# Patient Record
Sex: Female | Born: 1975
Health system: Southern US, Community
[De-identification: ages and names within clinical notes are randomized; demographics above are authoritative.]

## PROBLEM LIST (undated history)

## (undated) ENCOUNTER — Emergency Department (HOSPITAL_COMMUNITY): Admission: EM | Payer: Self-pay | Source: Home / Self Care

## (undated) DIAGNOSIS — B029 Zoster without complications: Secondary | ICD-10-CM

## (undated) DIAGNOSIS — I10 Essential (primary) hypertension: Secondary | ICD-10-CM

## (undated) DIAGNOSIS — K589 Irritable bowel syndrome without diarrhea: Secondary | ICD-10-CM

## (undated) DIAGNOSIS — K219 Gastro-esophageal reflux disease without esophagitis: Secondary | ICD-10-CM

## (undated) DIAGNOSIS — R0683 Snoring: Secondary | ICD-10-CM

## (undated) DIAGNOSIS — F419 Anxiety disorder, unspecified: Secondary | ICD-10-CM

## (undated) DIAGNOSIS — M179 Osteoarthritis of knee, unspecified: Secondary | ICD-10-CM

## (undated) DIAGNOSIS — K649 Unspecified hemorrhoids: Secondary | ICD-10-CM

## (undated) HISTORY — DX: Essential (primary) hypertension: I10

## (undated) HISTORY — DX: Gastro-esophageal reflux disease without esophagitis: K21.9

## (undated) HISTORY — PX: MOUTH SURGERY: SHX715

## (undated) HISTORY — PX: WISDOM TOOTH EXTRACTION: SHX21

## (undated) HISTORY — DX: Snoring: R06.83

## (undated) HISTORY — DX: Irritable bowel syndrome, unspecified: K58.9

## (undated) HISTORY — DX: Zoster without complications: B02.9

## (undated) HISTORY — DX: Osteoarthritis of knee, unspecified: M17.9

## (undated) HISTORY — DX: Unspecified hemorrhoids: K64.9

## (undated) HISTORY — DX: Anxiety disorder, unspecified: F41.9

---

## 2001-02-02 ENCOUNTER — Emergency Department (HOSPITAL_COMMUNITY): Admission: EM | Admit: 2001-02-02 | Discharge: 2001-02-02 | Payer: Self-pay | Admitting: Emergency Medicine

## 2001-02-04 ENCOUNTER — Emergency Department (HOSPITAL_COMMUNITY): Admission: EM | Admit: 2001-02-04 | Discharge: 2001-02-04 | Payer: Self-pay | Admitting: Emergency Medicine

## 2001-02-05 ENCOUNTER — Emergency Department (HOSPITAL_COMMUNITY): Admission: EM | Admit: 2001-02-05 | Discharge: 2001-02-05 | Payer: Self-pay | Admitting: Internal Medicine

## 2001-02-06 ENCOUNTER — Emergency Department (HOSPITAL_COMMUNITY): Admission: EM | Admit: 2001-02-06 | Discharge: 2001-02-06 | Payer: Self-pay | Admitting: Emergency Medicine

## 2001-02-27 ENCOUNTER — Emergency Department (HOSPITAL_COMMUNITY): Admission: EM | Admit: 2001-02-27 | Discharge: 2001-02-27 | Payer: Self-pay | Admitting: Emergency Medicine

## 2001-02-28 ENCOUNTER — Emergency Department (HOSPITAL_COMMUNITY): Admission: EM | Admit: 2001-02-28 | Discharge: 2001-02-28 | Payer: Self-pay | Admitting: Emergency Medicine

## 2001-03-01 ENCOUNTER — Ambulatory Visit (HOSPITAL_COMMUNITY): Admission: RE | Admit: 2001-03-01 | Discharge: 2001-03-01 | Payer: Self-pay | Admitting: Family Medicine

## 2001-03-01 ENCOUNTER — Encounter: Payer: Self-pay | Admitting: Family Medicine

## 2001-09-20 ENCOUNTER — Emergency Department (HOSPITAL_COMMUNITY): Admission: EM | Admit: 2001-09-20 | Discharge: 2001-09-21 | Payer: Self-pay | Admitting: *Deleted

## 2002-02-19 ENCOUNTER — Emergency Department (HOSPITAL_COMMUNITY): Admission: EM | Admit: 2002-02-19 | Discharge: 2002-02-19 | Payer: Self-pay | Admitting: Internal Medicine

## 2002-02-19 ENCOUNTER — Encounter: Payer: Self-pay | Admitting: Internal Medicine

## 2002-08-31 ENCOUNTER — Inpatient Hospital Stay (HOSPITAL_COMMUNITY): Admission: EM | Admit: 2002-08-31 | Discharge: 2002-09-03 | Payer: Self-pay | Admitting: *Deleted

## 2003-05-12 ENCOUNTER — Emergency Department (HOSPITAL_COMMUNITY): Admission: EM | Admit: 2003-05-12 | Discharge: 2003-05-12 | Payer: Self-pay | Admitting: Emergency Medicine

## 2003-09-15 ENCOUNTER — Emergency Department (HOSPITAL_COMMUNITY): Admission: EM | Admit: 2003-09-15 | Discharge: 2003-09-15 | Payer: Self-pay | Admitting: Emergency Medicine

## 2004-06-08 ENCOUNTER — Emergency Department (HOSPITAL_COMMUNITY): Admission: EM | Admit: 2004-06-08 | Discharge: 2004-06-08 | Payer: Self-pay | Admitting: Emergency Medicine

## 2007-02-02 ENCOUNTER — Emergency Department (HOSPITAL_COMMUNITY): Admission: EM | Admit: 2007-02-02 | Discharge: 2007-02-03 | Payer: Self-pay | Admitting: Emergency Medicine

## 2007-04-21 ENCOUNTER — Ambulatory Visit: Payer: Self-pay | Admitting: Cardiology

## 2007-06-21 ENCOUNTER — Emergency Department (HOSPITAL_COMMUNITY): Admission: EM | Admit: 2007-06-21 | Discharge: 2007-06-21 | Payer: Self-pay | Admitting: Emergency Medicine

## 2009-07-04 ENCOUNTER — Inpatient Hospital Stay (HOSPITAL_COMMUNITY): Admission: AD | Admit: 2009-07-04 | Discharge: 2009-07-05 | Payer: Self-pay | Admitting: Obstetrics and Gynecology

## 2009-08-15 ENCOUNTER — Ambulatory Visit (HOSPITAL_COMMUNITY): Admission: RE | Admit: 2009-08-15 | Discharge: 2009-08-15 | Payer: Self-pay | Admitting: Obstetrics & Gynecology

## 2009-08-21 ENCOUNTER — Inpatient Hospital Stay (HOSPITAL_COMMUNITY): Admission: AD | Admit: 2009-08-21 | Discharge: 2009-08-21 | Payer: Self-pay | Admitting: Obstetrics & Gynecology

## 2009-11-17 ENCOUNTER — Inpatient Hospital Stay (HOSPITAL_COMMUNITY): Admission: AD | Admit: 2009-11-17 | Discharge: 2009-11-17 | Payer: Self-pay | Admitting: Obstetrics & Gynecology

## 2010-02-07 ENCOUNTER — Inpatient Hospital Stay (HOSPITAL_COMMUNITY): Admission: AD | Admit: 2010-02-07 | Discharge: 2010-02-07 | Payer: Self-pay | Admitting: Obstetrics and Gynecology

## 2010-02-11 ENCOUNTER — Ambulatory Visit: Payer: Self-pay | Admitting: Family

## 2010-02-11 ENCOUNTER — Inpatient Hospital Stay (HOSPITAL_COMMUNITY)
Admission: AD | Admit: 2010-02-11 | Discharge: 2010-02-11 | Payer: Self-pay | Source: Home / Self Care | Admitting: Obstetrics & Gynecology

## 2010-02-20 ENCOUNTER — Inpatient Hospital Stay (HOSPITAL_COMMUNITY)
Admission: RE | Admit: 2010-02-20 | Discharge: 2010-02-22 | Payer: Self-pay | Source: Home / Self Care | Admitting: Obstetrics & Gynecology

## 2010-09-05 LAB — CBC
HCT: 34.4 % — ABNORMAL LOW (ref 36.0–46.0)
Hemoglobin: 11.3 g/dL — ABNORMAL LOW (ref 12.0–15.0)
MCH: 26.9 pg (ref 26.0–34.0)
MCHC: 32.8 g/dL (ref 30.0–36.0)
MCV: 82 fL (ref 78.0–100.0)
Platelets: 221 10*3/uL (ref 150–400)
RBC: 4.2 MIL/uL (ref 3.87–5.11)
RDW: 15.3 % (ref 11.5–15.5)
WBC: 12.4 10*3/uL — ABNORMAL HIGH (ref 4.0–10.5)

## 2010-09-06 LAB — CBC
HCT: 35 % — ABNORMAL LOW (ref 36.0–46.0)
Hemoglobin: 11.7 g/dL — ABNORMAL LOW (ref 12.0–15.0)
MCH: 26.9 pg (ref 26.0–34.0)
MCHC: 33.4 g/dL (ref 30.0–36.0)
MCV: 80.6 fL (ref 78.0–100.0)
Platelets: 216 10*3/uL (ref 150–400)
RBC: 4.34 MIL/uL (ref 3.87–5.11)
RDW: 15.2 % (ref 11.5–15.5)
WBC: 9.3 10*3/uL (ref 4.0–10.5)

## 2010-09-06 LAB — RPR: RPR Ser Ql: NONREACTIVE

## 2010-09-08 LAB — WET PREP, GENITAL
Clue Cells Wet Prep HPF POC: NONE SEEN
Trich, Wet Prep: NONE SEEN
Yeast Wet Prep HPF POC: NONE SEEN

## 2010-09-08 LAB — GC/CHLAMYDIA PROBE AMP, GENITAL
Chlamydia, DNA Probe: NEGATIVE
GC Probe Amp, Genital: NEGATIVE

## 2010-09-08 LAB — URINALYSIS, ROUTINE W REFLEX MICROSCOPIC
Bilirubin Urine: NEGATIVE
Glucose, UA: NEGATIVE mg/dL
Ketones, ur: NEGATIVE mg/dL
Leukocytes, UA: NEGATIVE
Nitrite: NEGATIVE
Protein, ur: NEGATIVE mg/dL
Specific Gravity, Urine: 1.03 (ref 1.005–1.030)
Urobilinogen, UA: 0.2 mg/dL (ref 0.0–1.0)
pH: 5 (ref 5.0–8.0)

## 2010-09-08 LAB — HCG, QUANTITATIVE, PREGNANCY: hCG, Beta Chain, Quant, S: 17403 m[IU]/mL — ABNORMAL HIGH (ref <5)

## 2010-09-08 LAB — URINE MICROSCOPIC-ADD ON

## 2010-09-08 LAB — CBC
HCT: 38.9 % (ref 36.0–46.0)
Hemoglobin: 12.6 g/dL (ref 12.0–15.0)
MCHC: 32.3 g/dL (ref 30.0–36.0)
MCV: 81.8 fL (ref 78.0–100.0)
Platelets: 279 10*3/uL (ref 150–400)
RBC: 4.75 MIL/uL (ref 3.87–5.11)
RDW: 15.9 % — ABNORMAL HIGH (ref 11.5–15.5)
WBC: 9.5 10*3/uL (ref 4.0–10.5)

## 2010-09-08 LAB — ABO/RH: ABO/RH(D): B POS

## 2010-09-09 LAB — URINALYSIS, ROUTINE W REFLEX MICROSCOPIC
Bilirubin Urine: NEGATIVE
Glucose, UA: NEGATIVE mg/dL
Hgb urine dipstick: NEGATIVE
Ketones, ur: NEGATIVE mg/dL
Leukocytes, UA: NEGATIVE
Nitrite: NEGATIVE
Protein, ur: 30 mg/dL — AB
Specific Gravity, Urine: >1.03 — ABNORMAL HIGH (ref 1.005–1.030)
Urobilinogen, UA: 0.2 mg/dL (ref 0.0–1.0)
pH: 5.5 (ref 5.0–8.0)

## 2010-09-09 LAB — URINE MICROSCOPIC-ADD ON
RBC / HPF: NONE SEEN RBC/hpf (ref <3)
WBC, UA: NONE SEEN WBC/hpf (ref <3)

## 2010-09-12 LAB — CBC
HCT: 35 % — ABNORMAL LOW (ref 36.0–46.0)
Hemoglobin: 11.4 g/dL — ABNORMAL LOW (ref 12.0–15.0)
MCHC: 32.5 g/dL (ref 30.0–36.0)
MCV: 81.2 fL (ref 78.0–100.0)
Platelets: 257 10*3/uL (ref 150–400)
RBC: 4.32 MIL/uL (ref 3.87–5.11)
RDW: 14.7 % (ref 11.5–15.5)
WBC: 8.8 10*3/uL (ref 4.0–10.5)
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
## 2011-03-28 LAB — URINALYSIS, ROUTINE W REFLEX MICROSCOPIC
Bilirubin Urine: NEGATIVE
Glucose, UA: NEGATIVE
Hgb urine dipstick: NEGATIVE
Nitrite: NEGATIVE
Specific Gravity, Urine: 1.02
Urobilinogen, UA: 0.2
pH: 7

## 2011-03-28 LAB — URINE MICROSCOPIC-ADD ON

## 2012-08-19 ENCOUNTER — Ambulatory Visit (HOSPITAL_COMMUNITY): Payer: Self-pay | Admitting: Psychology

## 2013-12-08 ENCOUNTER — Encounter (HOSPITAL_COMMUNITY): Payer: Self-pay | Admitting: Emergency Medicine

## 2013-12-08 ENCOUNTER — Emergency Department (HOSPITAL_COMMUNITY)
Admission: EM | Admit: 2013-12-08 | Discharge: 2013-12-08 | Disposition: A | Discharge status: Home / Self Care | Payer: PRIVATE HEALTH INSURANCE | Attending: Emergency Medicine | Admitting: Emergency Medicine

## 2013-12-08 DIAGNOSIS — I10 Essential (primary) hypertension: Secondary | ICD-10-CM | POA: Insufficient documentation

## 2013-12-08 DIAGNOSIS — T63461A Toxic effect of venom of wasps, accidental (unintentional), initial encounter: Secondary | ICD-10-CM | POA: Insufficient documentation

## 2013-12-08 DIAGNOSIS — T6391XA Toxic effect of contact with unspecified venomous animal, accidental (unintentional), initial encounter: Secondary | ICD-10-CM | POA: Insufficient documentation

## 2013-12-08 DIAGNOSIS — Y939 Activity, unspecified: Secondary | ICD-10-CM | POA: Insufficient documentation

## 2013-12-08 DIAGNOSIS — Y929 Unspecified place or not applicable: Secondary | ICD-10-CM | POA: Insufficient documentation

## 2013-12-08 DIAGNOSIS — W57XXXA Bitten or stung by nonvenomous insect and other nonvenomous arthropods, initial encounter: Secondary | ICD-10-CM

## 2013-12-08 MED ORDER — LORATADINE 10 MG PO TABS
10.0000 mg | ORAL_TABLET | Freq: Once | ORAL | Status: AC
  Administered 2013-12-08: 10 mg via ORAL
  Filled 2013-12-08: qty 1

## 2013-12-08 MED ORDER — PREDNISONE 10 MG PO TABS: ORAL_TABLET | ORAL | Status: DC

## 2013-12-08 MED ORDER — PREDNISONE 50 MG PO TABS
60.0000 mg | ORAL_TABLET | Freq: Once | ORAL | Status: AC
  Administered 2013-12-08: 60 mg via ORAL
  Filled 2013-12-08 (×2): qty 1

## 2013-12-08 MED ORDER — TRIAMCINOLONE ACETONIDE 0.1 % EX CREA: 1.0000 "application " | TOPICAL_CREAM | Freq: Two times a day (BID) | CUTANEOUS | Status: DC

## 2013-12-08 NOTE — ED Provider Notes (Signed)
CSN: [id number]     Arrival date & time 12/08/13  1614 History   First MD Initiated Contact with Patient 12/08/13 1634     Chief Complaint  Patient presents with  . Insect Bite     (Consider location/radiation/quality/duration/timing/severity/associated sxs/prior Treatment) HPI Comments: The patient is a 38 year old female who presents to the emergency department with complaint of multiple insect bites. The patient states that earlier this morning she was mowing the lawn, when she rolled over a yellow jackets nest with her mower. She complains of being stung at least 9 or 10 times. She complains of pain, but presents to the emergency department primarily because she is now beginning to feel that her throat is becoming" sore".  The history is provided by the patient.    History reviewed. No pertinent past medical history. History reviewed. No pertinent past surgical history. Family History  Problem Relation Age of Onset  . Kidney disease Mother   . Cancer Father    History  Substance Use Topics  . Smoking status: Never Smoker   . Smokeless tobacco: Never Used  . Alcohol Use: No   OB History   Grav Para Term Preterm Abortions TAB SAB Ect Mult Living                 Review of Systems  Constitutional: Negative for activity change.       All ROS Neg except as noted in HPI  HENT: Negative for nosebleeds.   Eyes: Negative for photophobia and discharge.  Respiratory: Negative for cough, shortness of breath and wheezing.   Cardiovascular: Negative for chest pain and palpitations.  Gastrointestinal: Negative for abdominal pain and blood in stool.  Genitourinary: Negative for dysuria, frequency and hematuria.  Musculoskeletal: Negative for arthralgias, back pain and neck pain.  Skin: Negative.   Neurological: Negative for dizziness, seizures and speech difficulty.  Psychiatric/Behavioral: Negative for hallucinations and confusion.      Allergies  Review of patient's allergies  indicates no known allergies.  Home Medications   Prior to Admission medications   Medication Sig Start Date End Date Taking? Authorizing Provider  Multiple Vitamin (MULTIVITAMIN WITH MINERALS) TABS tablet Take 1 tablet by mouth daily.   Yes Historical Provider, MD   BP 146/103  Pulse 90  Temp(Src) 98.1 F (36.7 C) (Oral)  Resp 18  Ht 5\' 3"  (1.6 m)  Wt 265 lb (120.203 kg)  BMI 46.95 kg/m2  SpO2 97% Physical Exam  Nursing note and vitals reviewed. Constitutional: She is oriented to person, place, and time. She appears well-developed and well-nourished.  Non-toxic appearance.  HENT:  Head: Normocephalic.  Right Ear: Tympanic membrane and external ear normal.  Left Ear: Tympanic membrane and external ear normal.  Eyes: EOM and lids are normal. Pupils are equal, round, and reactive to light.  Neck: Normal range of motion. Neck supple. Carotid bruit is not present. No tracheal deviation present.  Cardiovascular: Normal rate, regular rhythm, normal heart sounds, intact distal pulses and normal pulses.   Pulmonary/Chest: Breath sounds normal. No stridor. No respiratory distress. She has no wheezes. She has no rales.  Abdominal: Soft. Bowel sounds are normal. There is no tenderness. There is no guarding.  Musculoskeletal: Normal range of motion.  Lymphadenopathy:       Head (right side): No submandibular adenopathy present.       Head (left side): No submandibular adenopathy present.    She has no cervical adenopathy.  Neurological: She is alert and oriented to person,  place, and time. She has normal strength. No cranial nerve deficit or sensory deficit.  Skin: Skin is warm and dry.  Multiple red raised areas of the upper extremities, left lower extremity, shoulder and back area. These are consistent with yellow jacket stings. No hives noted.  Psychiatric: She has a normal mood and affect. Her speech is normal.    ED Course  Procedures (including critical care time) Labs  Review Labs Reviewed - No data to display  Imaging Review No results found.   EKG Interpretation None      MDM Patient sustained multiple bee stings after running over a yellow jacket nest with the lawn mower. She has not had any unusual shortness of breath. She states that she began to feel some mild soreness of her throat, and thought that she should come in to be evaluated. This incident happened earlier during the morning.  The airway is patent. The patient speaks in complete sentences without any problem. There no additional hives, and the lungs are clear to anterior and posterior auscultation.  The patient will be treated with prednisone taper, nondrowsy antihistamine each morning, Benadryl at bedtime or every 6 hours if needed. Patient has been advised to return to the emergency apartment immediately if any changes, problems, or concerns.    Final diagnoses:  None    **I have reviewed nursing notes, vital signs, and all appropriate lab and imaging results for this patient.Kathie Dike*    Sutter Ahlgren M Maddisyn Hegwood, PA-C 12/08/13 [phone]

## 2013-12-08 NOTE — ED Provider Notes (Signed)
Medical screening examination/treatment/procedure(s) were performed by non-physician practitioner and as supervising physician I was immediately available for consultation/collaboration.   EKG Interpretation None      Hermon Zea, MD, FACEP   Leotta Weingarten L Israella Hubert, MD 12/08/13 1931 

## 2013-12-08 NOTE — ED Notes (Signed)
Patient reports mowing yard this morning when she ran over yellow-jackets with lawn mower. Patient reports being stung approx 9-10 time. Per patient still has pain. Patient denies any swelling but reports throat is now becoming sore.

## 2013-12-08 NOTE — Discharge Instructions (Signed)
Please use the prednisone taper daily until all taken, please take this medication with food. Please use one of the nondrowsy antihistamines (Claritin, or Allegra) each morning. Use Benadryl at bedtime, or may use every 6 hours if needed for more severe itching. Please return to the emergency department immediately if any difficulty with breathing, excessive hives, or unusual swelling. Insect Bite Mosquitoes, flies, fleas, bedbugs, and other insects can bite. Insect bites are different from insect stings. The bite may be red, puffy (swollen), and itchy for 2 to 4 days. Most bites get better on their own. HOME CARE   Do not scratch the bite.  Keep the bite clean and dry. Wash the bite with soap and water.  Put ice on the bite.  Put ice in a plastic bag.  Place a towel between your skin and the bag.  Leave the ice on for 20 minutes, 4 times a day. Do this for the first 2 to 3 days, or as told by your doctor.  You may use medicated lotions or creams to lessen itching as told by your doctor.  Only take medicines as told by your doctor.  If you are given medicines (antibiotics), take them as told. Finish them even if you start to feel better. You may need a tetanus shot if:  You cannot remember when you had your last tetanus shot.  You have never had a tetanus shot.  The injury broke your skin. If you need a tetanus shot and you choose not to have one, you may get tetanus. Sickness from tetanus can be serious. GET HELP RIGHT AWAY IF:   You have more pain, redness, or puffiness.  You see a red line on the skin coming from the bite.  You have a fever.  You have joint pain.  You have a headache or neck pain.  You feel weak.  You have a rash.  You have chest pain, or you are short of breath.  You have belly (abdominal) pain.  You feel sick to your stomach (nauseous) or throw up (vomit).  You feel very tired or sleepy. MAKE SURE YOU:   Understand these  instructions.  Will watch your condition.  Will get help right away if you are not doing well or get worse. Document Released: 06/06/2000 Document Revised: 09/01/2011 Document Reviewed: 01/08/2011 [facility] Patient Information 2015 [place], [place]. This information is not intended to replace advice given to you by your health care provider. Make sure you discuss any questions you have with your health care provider.

## 2013-12-08 NOTE — ED Notes (Signed)
Pt to ED after running over yellow-jacket nest with lawnmower. Pt has several sting areas to L. Shoulder, upper back, R arm and L. Leg. Pt states she came to ER because she felt like her throat was getting "scratchy".

## 2014-03-09 ENCOUNTER — Encounter (HOSPITAL_COMMUNITY): Payer: Self-pay | Admitting: Emergency Medicine

## 2014-03-09 ENCOUNTER — Emergency Department (HOSPITAL_COMMUNITY): Payer: PRIVATE HEALTH INSURANCE

## 2014-03-09 ENCOUNTER — Emergency Department (HOSPITAL_COMMUNITY)
Admission: EM | Admit: 2014-03-09 | Discharge: 2014-03-09 | Disposition: A | Discharge status: Home / Self Care | Payer: Self-pay | Attending: Emergency Medicine | Admitting: Emergency Medicine

## 2014-03-09 DIAGNOSIS — R000 Tachycardia, unspecified: Secondary | ICD-10-CM | POA: Insufficient documentation

## 2014-03-09 DIAGNOSIS — Z79899 Other long term (current) drug therapy: Secondary | ICD-10-CM | POA: Insufficient documentation

## 2014-03-09 DIAGNOSIS — R110 Nausea: Secondary | ICD-10-CM | POA: Insufficient documentation

## 2014-03-09 DIAGNOSIS — Z792 Long term (current) use of antibiotics: Secondary | ICD-10-CM | POA: Insufficient documentation

## 2014-03-09 DIAGNOSIS — J159 Unspecified bacterial pneumonia: Secondary | ICD-10-CM | POA: Insufficient documentation

## 2014-03-09 DIAGNOSIS — R079 Chest pain, unspecified: Secondary | ICD-10-CM | POA: Insufficient documentation

## 2014-03-09 DIAGNOSIS — R509 Fever, unspecified: Secondary | ICD-10-CM | POA: Insufficient documentation

## 2014-03-09 DIAGNOSIS — J189 Pneumonia, unspecified organism: Secondary | ICD-10-CM

## 2014-03-09 DIAGNOSIS — R42 Dizziness and giddiness: Secondary | ICD-10-CM | POA: Insufficient documentation

## 2014-03-09 MED ORDER — HYDROCODONE-ACETAMINOPHEN 7.5-325 MG/15ML PO SOLN: 15.0000 mL | ORAL | Status: DC | PRN

## 2014-03-09 MED ORDER — LEVOFLOXACIN 750 MG PO TABS
750.0000 mg | ORAL_TABLET | Freq: Once | ORAL | Status: AC
  Administered 2014-03-09: 750 mg via ORAL
  Filled 2014-03-09: qty 1

## 2014-03-09 MED ORDER — FLUCONAZOLE 200 MG PO TABS: ORAL_TABLET | ORAL | Status: DC

## 2014-03-09 MED ORDER — LEVOFLOXACIN 500 MG PO TABS: 500.0000 mg | ORAL_TABLET | Freq: Every day | ORAL | Status: DC

## 2014-03-09 MED ORDER — ACETAMINOPHEN 325 MG PO TABS
650.0000 mg | ORAL_TABLET | Freq: Once | ORAL | Status: AC
  Administered 2014-03-09: 650 mg via ORAL
  Filled 2014-03-09: qty 2

## 2014-03-09 MED ORDER — BENZONATATE 100 MG PO CAPS: 100.0000 mg | ORAL_CAPSULE | Freq: Three times a day (TID) | ORAL | Status: DC

## 2014-03-09 NOTE — ED Notes (Signed)
Sob, fever,green sputum.  Vomiting. For 4 days.

## 2014-03-09 NOTE — ED Notes (Signed)
MD at bedside. 

## 2014-03-09 NOTE — Discharge Instructions (Signed)
Pneumonia, Adult  Pneumonia is an infection of the lungs. It may be caused by a germ (virus or bacteria). Some types of pneumonia can spread easily from person to person. This can happen when you cough or sneeze.  HOME CARE   Only take medicine as told by your doctor.   Take your medicine (antibiotics) as told. Finish it even if you start to feel better.   Do not smoke.   You may use a vaporizer or humidifier in your room. This can help loosen thick spit (mucus).   Sleep so you are almost sitting up (semi-upright). This helps reduce coughing.   Rest.  A shot (vaccine) can help prevent pneumonia. Shots are often advised for:   People over 65 years old.   Patients on chemotherapy.   People with long-term (chronic) lung problems.   People with immune system problems.  GET HELP RIGHT AWAY IF:    You are getting worse.   You cannot control your cough, and you are losing sleep.   You cough up blood.   Your pain gets worse, even with medicine.   You have a fever.   Any of your problems are getting worse, not better.   You have shortness of breath or chest pain.  MAKE SURE YOU:    Understand these instructions.   Will watch your condition.   Will get help right away if you are not doing well or get worse.  Document Released: 11/26/2007 Document Revised: 09/01/2011 Document Reviewed: 08/30/2010  ExitCare Patient Information 2015 [employer]. This information is not intended to replace advice given to you by your health care provider. Make sure you discuss any questions you have with your health care provider.

## 2014-03-09 NOTE — ED Provider Notes (Signed)
CSN: [id number]     Arrival date & time 03/09/14  1458 History  This chart was scribed for Rolland Porter, MD by Littie Deeds, ED Scribe. This patient was seen in room APA19/APA19 and the patient's care was started at 3:31 PM.       Chief Complaint  Patient presents with  . Fever      The history is provided by the patient. No language interpreter was used.   HPI Comments: Gwendolyn Edwards is a 38 y.o. female who presents to the Emergency Department complaining of fever of 104 degrees F. She notes associated tremors, intermittent dizziness, productive cough with yellow sputum, chest pain with coughing, nausea, SOB with coughing, and orthopnea. The dizziness is described as a woozy feeling. She has been taking Mucinex for her symptoms. Patient denies sore throat, HA, cold or flu symptoms, vomiting, joint pain and smoking. She has no significant Hx of health conditions and is generally healthy.  History reviewed. No pertinent past medical history. History reviewed. No pertinent past surgical history. Family History  Problem Relation Age of Onset  . Kidney disease Mother   . Cancer Father    History  Substance Use Topics  . Smoking status: Never Smoker   . Smokeless tobacco: Never Used  . Alcohol Use: No   OB History   Grav Para Term Preterm Abortions TAB SAB Ect Mult Living                 Review of Systems  Constitutional: Positive for fever. Negative for chills, diaphoresis, appetite change and fatigue.  HENT: Negative for mouth sores, sore throat and trouble swallowing.   Eyes: Negative for visual disturbance.  Respiratory: Positive for cough and shortness of breath (with cough). Negative for chest tightness and wheezing.   Cardiovascular: Positive for chest pain (with cough).  Gastrointestinal: Positive for nausea. Negative for vomiting, abdominal pain, diarrhea and abdominal distention.  Endocrine: Negative for polydipsia, polyphagia and polyuria.  Genitourinary:  Negative for dysuria, frequency and hematuria.  Musculoskeletal: Negative for gait problem.  Skin: Negative for color change, pallor and rash.  Neurological: Positive for dizziness. Negative for syncope, light-headedness and headaches.  Hematological: Does not bruise/bleed easily.  Psychiatric/Behavioral: Negative for behavioral problems and confusion.      Allergies  Review of patient's allergies indicates no known allergies.  Home Medications   Prior to Admission medications   Medication Sig Start Date End Date Taking? Authorizing Provider  guaiFENesin (MUCINEX) 600 MG 12 hr tablet Take 1,200 mg by mouth every 4 (four) hours as needed for cough or to loosen phlegm.   Yes Historical Provider, MD  Multiple Vitamin (MULTIVITAMIN WITH MINERALS) TABS tablet Take 1 tablet by mouth daily.   Yes Historical Provider, MD  benzonatate (TESSALON) 100 MG capsule Take 1 capsule (100 mg total) by mouth every 8 (eight) hours. 03/09/14   Rolland Porter, MD  fluconazole (DIFLUCAN) 200 MG tablet After Last dose of Antibiotic 03/09/14   Rolland Porter, MD  HYDROcodone-acetaminophen (HYCET) 7.5-325 mg/15 ml solution Take 15 mLs by mouth every 4 (four) hours as needed for moderate pain or severe pain. 03/09/14   Rolland Porter, MD  levofloxacin (LEVAQUIN) 500 MG tablet Take 1 tablet (500 mg total) by mouth daily. 03/09/14   Rolland Porter, MD   BP 137/89  Pulse 113  Temp(Src) 100.9 F (38.3 C) (Oral)  Resp 20  Ht  (1.6 m)  Wt 258 lb (117.028 kg)  BMI 45.71 kg/m2  SpO2 99%  LMP 03/06/2014 Physical Exam  Constitutional: She is oriented to person, place, and time. She appears well-developed and well-nourished. No distress.  Temp was 103.1  HENT:  Head: Normocephalic.  Eyes: Conjunctivae are normal. Pupils are equal, round, and reactive to light. No scleral icterus.  Neck: Normal range of motion. Neck supple. No thyromegaly present.  Cardiovascular: Regular rhythm.  Exam reveals no gallop and no friction rub.    No murmur heard. Slight tachycardia  Pulmonary/Chest: Effort normal and breath sounds normal. No respiratory distress. She has no wheezes. She has no rales.  No worries of diminished breath sounds, no prolongation  Abdominal: Soft. Bowel sounds are normal. She exhibits no distension. There is no tenderness. There is no rebound.  Musculoskeletal: Normal range of motion.  Neurological: She is alert and oriented to person, place, and time.  Skin: Skin is warm and dry. No rash noted.  Psychiatric: She has a normal mood and affect. Her behavior is normal.    ED Course  Procedures  DIAGNOSTIC STUDIES: Oxygen Saturation is 99% on RA, nml by my interpretation.    COORDINATION OF CARE: 3:36 PM-Discussed treatment plan which includes CXR with pt at bedside and pt agreed to plan.     Labs Review Labs Reviewed - No data to display  Imaging Review Dg Chest 2 View  03/09/2014   CLINICAL DATA:  Fever and cough for 4 days.  EXAM: CHEST  2 VIEW  COMPARISON:  September 15, 2003  FINDINGS: The heart size and mediastinal contours are within normal limits. There are patchy consolidation of bilateral lung bases. There is no pleural effusion. There is no pulmonary edema. The visualized skeletal structures are unremarkable.  IMPRESSION: Bibasilar pneumonias.   Electronically Signed   By: Sherian Rein M.D.   On: 03/09/2014 16:24     EKG Interpretation None      MDM   Final diagnoses:  Community acquired pneumonia   X-rays reviewed and demonstrated the patient. Shows right lower lobe versus right middle lobe infiltrate. Atelectasis at left base. She appears quite nontoxic. Otherwise healthy. Given by mouth Levaquin. Prescriptions for Levaquin, I said, Tessalon, and Diflucan for her last dose for Candida vaginitis prevention. On reexam she is afebrile, not tachycardic, and well oxygenated at 97% breathing room air.  I personally performed the services described in this documentation, which was scribed  in my presence. The recorded information has been reviewed and is accurate.    Rolland Porter, MD 03/09/14 [phone]

## 2014-03-11 ENCOUNTER — Emergency Department (HOSPITAL_COMMUNITY): Payer: Self-pay

## 2014-03-11 ENCOUNTER — Encounter (HOSPITAL_COMMUNITY): Payer: Self-pay | Admitting: Emergency Medicine

## 2014-03-11 ENCOUNTER — Emergency Department (HOSPITAL_COMMUNITY)
Admission: EM | Admit: 2014-03-11 | Discharge: 2014-03-11 | Disposition: A | Discharge status: Home / Self Care | Payer: Self-pay | Attending: Emergency Medicine | Admitting: Emergency Medicine

## 2014-03-11 DIAGNOSIS — R059 Cough, unspecified: Secondary | ICD-10-CM | POA: Insufficient documentation

## 2014-03-11 DIAGNOSIS — Z79899 Other long term (current) drug therapy: Secondary | ICD-10-CM | POA: Insufficient documentation

## 2014-03-11 DIAGNOSIS — J159 Unspecified bacterial pneumonia: Secondary | ICD-10-CM | POA: Insufficient documentation

## 2014-03-11 DIAGNOSIS — J189 Pneumonia, unspecified organism: Secondary | ICD-10-CM

## 2014-03-11 DIAGNOSIS — Z792 Long term (current) use of antibiotics: Secondary | ICD-10-CM | POA: Insufficient documentation

## 2014-03-11 DIAGNOSIS — R05 Cough: Secondary | ICD-10-CM | POA: Insufficient documentation

## 2014-03-11 LAB — CBC WITH DIFFERENTIAL/PLATELET
Basophils Absolute: 0 10*3/uL (ref 0.0–0.1)
Basophils Relative: 0 % (ref 0–1)
Eosinophils Absolute: 0.2 10*3/uL (ref 0.0–0.7)
Eosinophils Relative: 3 % (ref 0–5)
HCT: 38.1 % (ref 36.0–46.0)
Hemoglobin: 12.6 g/dL (ref 12.0–15.0)
Lymphocytes Relative: 37 % (ref 12–46)
Lymphs Abs: 1.7 10*3/uL (ref 0.7–4.0)
MCH: 26.3 pg (ref 26.0–34.0)
MCHC: 33.1 g/dL (ref 30.0–36.0)
MCV: 79.5 fL (ref 78.0–100.0)
Monocytes Absolute: 0.4 10*3/uL (ref 0.1–1.0)
Monocytes Relative: 8 % (ref 3–12)
Neutro Abs: 2.4 10*3/uL (ref 1.7–7.7)
Neutrophils Relative %: 52 % (ref 43–77)
Platelets: 296 10*3/uL (ref 150–400)
RBC: 4.79 MIL/uL (ref 3.87–5.11)
RDW: 14.7 % (ref 11.5–15.5)
WBC: 4.6 10*3/uL (ref 4.0–10.5)

## 2014-03-11 LAB — BASIC METABOLIC PANEL
Anion gap: 12 (ref 5–15)
BUN: 8 mg/dL (ref 6–23)
CO2: 26 mEq/L (ref 19–32)
Calcium: 8.5 mg/dL (ref 8.4–10.5)
Chloride: 100 mEq/L (ref 96–112)
Creatinine, Ser: 0.8 mg/dL (ref 0.50–1.10)
GFR calc Af Amer: >90 mL/min (ref >90)
GFR calc non Af Amer: >90 mL/min (ref >90)
Glucose, Bld: 99 mg/dL (ref 70–99)
Potassium: 3.8 mEq/L (ref 3.7–5.3)
Sodium: 138 mEq/L (ref 137–147)

## 2014-03-11 LAB — LACTIC ACID, PLASMA: Lactic Acid, Venous: 1 mmol/L (ref 0.5–2.2)

## 2014-03-11 MED ORDER — HYDROCODONE-ACETAMINOPHEN 5-325 MG PO TABS
2.0000 | ORAL_TABLET | Freq: Once | ORAL | Status: AC
  Administered 2014-03-11: 2 via ORAL
  Filled 2014-03-11: qty 2

## 2014-03-11 NOTE — ED Provider Notes (Signed)
CSN: [id number]     Arrival date & time 03/11/14  1439 History   First MD Initiated Contact with Patient 03/11/14 1454     This chart was scribed for Gwendolyn Roller, MD by Gwendolyn Edwards, ED Scribe. This patient was seen in room APA15/APA15 and the patient's care was started 4:11 PM.   Chief Complaint  Patient presents with  . Pneumonia   HPI  HPI Comments: Gwendolyn Edwards is a 38 y.o. female who presents to the Emergency Department complaining of pneumonia x 2 days that has progressively worsened. Pt was seen 9/17 and was diagnosed with CAP. She was treated with a course of Levaquin with 4 doses taken since Thursday. Pt was also prescribed Vicodin at discharge, however, this medication was not filled due to cost. She now reports an ongoing fever running from 100 to 101, feeling swimmy headed, and cough. She also reports discomfort to the R upper back that has become constant since last visit. Back pain is exacerbated with laying down without any alleviating factors. Ms. Gwendolyn Edwards states her breathing has slightly improved since last visit. She is otherwise healthy without any medical problems. No known allergies to medications.  History reviewed. No pertinent past medical history. History reviewed. No pertinent past surgical history. Family History  Problem Relation Age of Onset  . Kidney disease Mother   . Cancer Father    History  Substance Use Topics  . Smoking status: Never Smoker   . Smokeless tobacco: Never Used  . Alcohol Use: No   OB History   Grav Para Term Preterm Abortions TAB SAB Ect Mult Living                 Review of Systems  Constitutional: Positive for fever.  Respiratory: Positive for cough.   Musculoskeletal: Positive for back pain.  All other systems reviewed and are negative.     Allergies  Review of patient's allergies indicates no known allergies.  Home Medications   Prior to Admission medications   Medication Sig Start Date End Date  Taking? Authorizing Provider  benzonatate (TESSALON) 100 MG capsule Take 1 capsule (100 mg total) by mouth every 8 (eight) hours. 03/09/14  Yes Rolland Porter, MD  guaiFENesin (MUCINEX) 600 MG 12 hr tablet Take 1,200 mg by mouth every 4 (four) hours as needed for cough or to loosen phlegm.   Yes Historical Provider, MD  levofloxacin (LEVAQUIN) 500 MG tablet Take 1 tablet (500 mg total) by mouth daily. 03/09/14  Yes Rolland Porter, MD  Multiple Vitamin (MULTIVITAMIN WITH MINERALS) TABS tablet Take 1 tablet by mouth daily.   Yes Historical Provider, MD  fluconazole (DIFLUCAN) 200 MG tablet After Last dose of Antibiotic 03/09/14   Rolland Porter, MD  HYDROcodone-acetaminophen (HYCET) 7.5-325 mg/15 ml solution Take 15 mLs by mouth every 4 (four) hours as needed for moderate pain or severe pain. 03/09/14   Rolland Porter, MD   Triage Vitals: BP 136/78  Pulse 86  Temp(Src) 99.3 F (37.4 C) (Oral)  Resp 18  Ht  (1.6 m)  Wt 258 lb (117.028 kg)  BMI 45.71 kg/m2  SpO2 100%  LMP 03/06/2014   Physical Exam  Nursing note and vitals reviewed. Constitutional: She is oriented to person, place, and time. She appears well-developed and well-nourished. No distress.  HENT:  Head: Normocephalic and atraumatic.  Eyes: EOM are normal.  Neck: Normal range of motion.  Cardiovascular: Normal rate, regular rhythm and normal heart sounds.   Pulmonary/Chest: Effort  normal and breath sounds normal.  Abdominal: Soft. She exhibits no distension. There is no tenderness.  Musculoskeletal: Normal range of motion.  Neurological: She is alert and oriented to person, place, and time.  Skin: Skin is warm and dry.  Psychiatric: She has a normal mood and affect. Judgment normal.    ED Course  Procedures (including critical care time)  DIAGNOSTIC STUDIES: Oxygen Saturation is 100% on RA, Normal by my interpretation.    COORDINATION OF CARE: 4:11 PM- Will order DG chest 2 view, BMP, CBC, and lactic acid. Discussed treatment plan  with pt at bedside and pt agreed to plan.     Labs Review Labs Reviewed  CBC WITH DIFFERENTIAL  LACTIC ACID, PLASMA  BASIC METABOLIC PANEL    Imaging Review Dg Chest 2 View  03/11/2014   CLINICAL DATA:  Worsening cough and shortness of breath. Patient currently being treated for bilateral pneumonia.  EXAM: CHEST  2 VIEW  COMPARISON:  Two-view chest x-ray 03/09/2014. One view chest x-ray 09/14/2013.  FINDINGS: Cardiac silhouette normal in size, unchanged. Interval slight improvement in the patchy airspace opacities in the left lower lobe. No significant interval change in the airspace opacities in the right middle lobe. No new pulmonary parenchymal abnormalities in either lung. Mild degenerative changes involving the thoracic spine.  IMPRESSION: Stable right middle lobe pneumonia and slight improvement and left lower lobe pneumonia since the examination 2 days ago. No new abnormalities.   Electronically Signed   By: Hulan Saas M.D.   On: 03/11/2014 15:46   Dg Chest 2 View  03/09/2014   CLINICAL DATA:  Fever and cough for 4 days.  EXAM: CHEST  2 VIEW  COMPARISON:  September 15, 2003  FINDINGS: The heart size and mediastinal contours are within normal limits. There are patchy consolidation of bilateral lung bases. There is no pleural effusion. There is no pulmonary edema. The visualized skeletal structures are unremarkable.  IMPRESSION: Bibasilar pneumonias.   Electronically Signed   By: Sherian Rein M.D.   On: 03/09/2014 16:24      MDM   Final diagnoses:  CAP (community acquired pneumonia)    Xray shows no worsening, no leukocytosis, normal lactate, VS reassuring, pt i nformed and is in agreement for d/c.  vicodin given with improvement in pain  Meds given in ED:  Medications  HYDROcodone-acetaminophen (NORCO/VICODIN) 5-325 MG per tablet 2 tablet (2 tablets Oral Given 03/11/14 1532)    New Prescriptions   No medications on file      I personally performed the services described  in this documentation, which was scribed in my presence. The recorded information has been reviewed and is accurate.    Gwendolyn Roller, MD 03/11/14 [phone]

## 2014-03-11 NOTE — ED Notes (Signed)
Pt states she was dx with PNA Wednesday. Pt states she is have right pleural pain, fevers, cough, and dizziness.

## 2014-06-17 ENCOUNTER — Emergency Department (HOSPITAL_COMMUNITY)
Admission: EM | Admit: 2014-06-17 | Discharge: 2014-06-17 | Disposition: A | Discharge status: Home / Self Care | Payer: Self-pay | Attending: Emergency Medicine | Admitting: Emergency Medicine

## 2014-06-17 ENCOUNTER — Encounter (HOSPITAL_COMMUNITY): Payer: Self-pay

## 2014-06-17 ENCOUNTER — Emergency Department (HOSPITAL_COMMUNITY): Payer: Self-pay

## 2014-06-17 DIAGNOSIS — E669 Obesity, unspecified: Secondary | ICD-10-CM | POA: Insufficient documentation

## 2014-06-17 DIAGNOSIS — Z79899 Other long term (current) drug therapy: Secondary | ICD-10-CM | POA: Insufficient documentation

## 2014-06-17 DIAGNOSIS — R079 Chest pain, unspecified: Secondary | ICD-10-CM | POA: Insufficient documentation

## 2014-06-17 DIAGNOSIS — Z792 Long term (current) use of antibiotics: Secondary | ICD-10-CM | POA: Insufficient documentation

## 2014-06-17 LAB — COMPREHENSIVE METABOLIC PANEL
ALT: 12 U/L (ref 0–35)
AST: 15 U/L (ref 0–37)
Albumin: 3.7 g/dL (ref 3.5–5.2)
Alkaline Phosphatase: 78 U/L (ref 39–117)
Anion gap: 3 — ABNORMAL LOW (ref 5–15)
BUN: 10 mg/dL (ref 6–23)
CO2: 27 mmol/L (ref 19–32)
Calcium: 8.7 mg/dL (ref 8.4–10.5)
Chloride: 111 mEq/L (ref 96–112)
Creatinine, Ser: 0.84 mg/dL (ref 0.50–1.10)
GFR calc Af Amer: >90 mL/min (ref >90)
GFR calc non Af Amer: 86 mL/min — ABNORMAL LOW (ref >90)
Glucose, Bld: 95 mg/dL (ref 70–99)
Potassium: 3.8 mmol/L (ref 3.5–5.1)
Sodium: 141 mmol/L (ref 135–145)
Total Bilirubin: 0.2 mg/dL — ABNORMAL LOW (ref 0.3–1.2)
Total Protein: 7 g/dL (ref 6.0–8.3)

## 2014-06-17 LAB — CBC WITH DIFFERENTIAL/PLATELET
Basophils Absolute: 0 10*3/uL (ref 0.0–0.1)
Basophils Relative: 0 % (ref 0–1)
Eosinophils Absolute: 0.2 10*3/uL (ref 0.0–0.7)
Eosinophils Relative: 4 % (ref 0–5)
HCT: 37.4 % (ref 36.0–46.0)
Hemoglobin: 11.8 g/dL — ABNORMAL LOW (ref 12.0–15.0)
Lymphocytes Relative: 45 % (ref 12–46)
Lymphs Abs: 2.8 10*3/uL (ref 0.7–4.0)
MCH: 26 pg (ref 26.0–34.0)
MCHC: 31.6 g/dL (ref 30.0–36.0)
MCV: 82.6 fL (ref 78.0–100.0)
Monocytes Absolute: 0.4 10*3/uL (ref 0.1–1.0)
Monocytes Relative: 6 % (ref 3–12)
Neutro Abs: 2.8 10*3/uL (ref 1.7–7.7)
Neutrophils Relative %: 45 % (ref 43–77)
Platelets: 266 10*3/uL (ref 150–400)
RBC: 4.53 MIL/uL (ref 3.87–5.11)
RDW: 14.9 % (ref 11.5–15.5)
WBC: 6.2 10*3/uL (ref 4.0–10.5)

## 2014-06-17 LAB — LIPASE, BLOOD: Lipase: 39 U/L (ref 11–59)

## 2014-06-17 LAB — TROPONIN I
Troponin I: <0.03 ng/mL (ref <0.031)
Troponin I: <0.03 ng/mL (ref <0.031)

## 2014-06-17 MED ORDER — GI COCKTAIL ~~LOC~~
30.0000 mL | Freq: Once | ORAL | Status: AC
  Administered 2014-06-17: 30 mL via ORAL
  Filled 2014-06-17: qty 30

## 2014-06-17 MED ORDER — NAPROXEN 500 MG PO TABS: 500.0000 mg | ORAL_TABLET | Freq: Two times a day (BID) | ORAL | Status: DC

## 2014-06-17 MED ORDER — CYCLOBENZAPRINE HCL 10 MG PO TABS: 10.0000 mg | ORAL_TABLET | Freq: Two times a day (BID) | ORAL | Status: DC | PRN

## 2014-06-17 NOTE — Discharge Instructions (Signed)
Tests were normal. Medication for pain and muscle relaxation. Follow-up your primary care doctor or return if worse

## 2014-06-17 NOTE — ED Notes (Signed)
Pt reports pressure to mid abd and up into chest and back that is worse with movement and deep breathing, states it started about a week ago but worse tonight

## 2014-06-17 NOTE — ED Provider Notes (Signed)
CSN: [id number]     Arrival date & time 06/17/14  0051 History   First MD Initiated Contact with Patient 06/17/14 0101     Chief Complaint  Patient presents with  . Chest Pain     (Consider location/radiation/quality/duration/timing/severity/associated sxs/prior Treatment) HPI..... Central anterior chest pain intermittently for one week with radiation to the back. No fever, sweats, chills, diaphoresis, nausea, dyspnea. Nonsmoker. Family history negative for cardiac morbidity. Nothing makes symptoms better or worse. Severity is minimal to moderate. Nonsmoker  History reviewed. No pertinent past medical history. History reviewed. No pertinent past surgical history. Family History  Problem Relation Age of Onset  . Kidney disease Mother   . Cancer Father    History  Substance Use Topics  . Smoking status: Never Smoker   . Smokeless tobacco: Never Used  . Alcohol Use: No   OB History    No data available     Review of Systems  All other systems reviewed and are negative.     Allergies  Review of patient's allergies indicates no known allergies.  Home Medications   Prior to Admission medications   Medication Sig Start Date End Date Taking? Authorizing Provider  benzonatate (TESSALON) 100 MG capsule Take 1 capsule (100 mg total) by mouth every 8 (eight) hours. 03/09/14   Rolland PorterMark James, MD  cyclobenzaprine (FLEXERIL) 10 MG tablet Take 1 tablet (10 mg total) by mouth 2 (two) times daily as needed for muscle spasms. 06/17/14   Donnetta HutchingBrian Tamiyah Moulin, MD  fluconazole (DIFLUCAN) 200 MG tablet After Last dose of Antibiotic 03/09/14   Rolland PorterMark James, MD  guaiFENesin (MUCINEX) 600 MG 12 hr tablet Take 1,200 mg by mouth every 4 (four) hours as needed for cough or to loosen phlegm.    Historical Provider, MD  HYDROcodone-acetaminophen (HYCET) 7.5-325 mg/15 ml solution Take 15 mLs by mouth every 4 (four) hours as needed for moderate pain or severe pain. 03/09/14   Rolland PorterMark James, MD  levofloxacin (LEVAQUIN) 500  MG tablet Take 1 tablet (500 mg total) by mouth daily. 03/09/14   Rolland PorterMark James, MD  Multiple Vitamin (MULTIVITAMIN WITH MINERALS) TABS tablet Take 1 tablet by mouth daily.    Historical Provider, MD  naproxen (NAPROSYN) 500 MG tablet Take 1 tablet (500 mg total) by mouth 2 (two) times daily. 06/17/14   Donnetta HutchingBrian Acelyn Basham, MD   BP 146/67 mmHg  Pulse 74  Resp 21  SpO2 96%  LMP 06/12/2014 Physical Exam  Constitutional: She is oriented to person, place, and time. She appears well-developed and well-nourished.  obese  HENT:  Head: Normocephalic and atraumatic.  Eyes: Conjunctivae and EOM are normal. Pupils are equal, round, and reactive to light.  Neck: Normal range of motion. Neck supple.  Cardiovascular: Normal rate and regular rhythm.   Pulmonary/Chest: Effort normal and breath sounds normal.  Abdominal: Soft. Bowel sounds are normal.  Musculoskeletal: Normal range of motion.  Neurological: She is alert and oriented to person, place, and time.  Skin: Skin is warm and dry.  Psychiatric: She has a normal mood and affect. Her behavior is normal.  Nursing note and vitals reviewed.   ED Course  Procedures (including critical care time) Labs Review Labs Reviewed  CBC WITH DIFFERENTIAL - Abnormal; Notable for the following:    Hemoglobin 11.8 (*)    All other components within normal limits  COMPREHENSIVE METABOLIC PANEL - Abnormal; Notable for the following:    Total Bilirubin 0.2 (*)    GFR calc non Af Amer 86 (*)  Anion gap 3 (*)    All other components within normal limits  TROPONIN I  LIPASE, BLOOD  TROPONIN I    Imaging Review Dg Chest 2 View  06/17/2014   CLINICAL DATA:  Chest pain.  Mid chest pain.  Acute onset.  EXAM: CHEST  2 VIEW  COMPARISON:  Radiograph 03/11/2014  FINDINGS: Normal mediastinum and cardiac silhouette. Normal pulmonary vasculature. No evidence of effusion, infiltrate, or pneumothorax. No acute bony abnormality.  IMPRESSION: No acute cardiopulmonary process.    Electronically Signed   By: Genevive BiStewart  Edmunds M.D.   On: 06/17/2014 01:38     EKG Interpretation   Date/Time:  Saturday June 17 2014 01:01:06 EST Ventricular Rate:  68 PR Interval:  146 QRS Duration: 87 QT Interval:  400 QTC Calculation: 425 R Axis:   68 Text Interpretation:  Sinus rhythm Confirmed by Gwynne Kemnitz  MD, Yusuf Yu ([id number]) on  06/17/2014 1:22:09 AM      MDM   Final diagnoses:  Chest pain, unspecified chest pain type    Patient is in no acute distress. EKG, chest x-ray, troponin 2 negative. Discharge medications Naprosyn 500 mg and Flexeril 10 mg    Donnetta HutchingBrian Bryleigh Ottaway, MD 06/17/14 [phone]

## 2014-12-27 ENCOUNTER — Encounter (HOSPITAL_COMMUNITY): Payer: Self-pay | Admitting: *Deleted

## 2014-12-27 ENCOUNTER — Emergency Department (HOSPITAL_COMMUNITY)
Admission: EM | Admit: 2014-12-27 | Discharge: 2014-12-28 | Disposition: A | Discharge status: Home / Self Care | Payer: 59 | Attending: Emergency Medicine | Admitting: Emergency Medicine

## 2014-12-27 DIAGNOSIS — Z79899 Other long term (current) drug therapy: Secondary | ICD-10-CM | POA: Diagnosis not present

## 2014-12-27 DIAGNOSIS — R110 Nausea: Secondary | ICD-10-CM | POA: Diagnosis not present

## 2014-12-27 DIAGNOSIS — R079 Chest pain, unspecified: Secondary | ICD-10-CM | POA: Diagnosis present

## 2014-12-27 DIAGNOSIS — E663 Overweight: Secondary | ICD-10-CM | POA: Insufficient documentation

## 2014-12-27 MED ORDER — MORPHINE SULFATE 4 MG/ML IJ SOLN
4.0000 mg | Freq: Once | INTRAMUSCULAR | Status: AC
  Administered 2014-12-27: 4 mg via INTRAVENOUS
  Filled 2014-12-27: qty 1

## 2014-12-27 MED ORDER — NITROGLYCERIN 2 % TD OINT
1.0000 [in_us] | TOPICAL_OINTMENT | Freq: Once | TRANSDERMAL | Status: AC
  Administered 2014-12-27: 1 [in_us] via TOPICAL
  Filled 2014-12-27: qty 1

## 2014-12-27 MED ORDER — ONDANSETRON HCL 4 MG/2ML IJ SOLN
4.0000 mg | Freq: Once | INTRAMUSCULAR | Status: AC
  Administered 2014-12-27: 4 mg via INTRAVENOUS
  Filled 2014-12-27: qty 2

## 2014-12-27 NOTE — ED Notes (Signed)
Pt. [facility] [employer] and was taking vital signs and started having 9/10 left chest pressure radiating to the neck. Pt. Has had chest pain for about two weeks and has an appointment on 7/11 with a cardiologist. Tonight the pain was more intense than it has been in the past two weeks. Pt. Also c/o nausea with this chest pain

## 2014-12-27 NOTE — ED Provider Notes (Signed)
CSN: [id number]     Arrival date & time 12/27/14  2237 History   This chart was scribed for Shon Batonourtney F Kaelene Elliston, MD by Arlan OrganAshley Leger, ED Scribe. This patient was seen in room B17C/B17C and the patient's care was started 11:32 PM.   Chief Complaint  Patient presents with  . Chest Pain   The history is provided by the patient. No language interpreter was used.    HPI Comments: Gwendolyn Edwards brought in by EMS is a 39 y.o. female without any pertinent past medical history who presents to the Emergency Department complaining of intermittent, ongoing, worsening L sided chest pain that radiates into the neck x 2 weeks. Pain is described as pressure and rated 9/10 at highest intensity. 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 She also reports ongoing nausea. No OTC medications attempted prior to arrival, however, pt was given ASA chewables en route to department. No recent fever, chills, cough, shortness of breath, abdominal pain, or vomiting. Gwendolyn Edwards was started on Lasix 1 week ago for ongoing bilateral lower extremity edema. Pt has an appointment with cardiology on 7/11. No current estrogen use. No early family history of heart disease. No known allergies to medications.  History reviewed. No pertinent past medical history. History reviewed. No pertinent past surgical history. Family History  Problem Relation Age of Onset  . Kidney disease Mother   . Cancer Father    History  Substance Use Topics  . Smoking status: Never Smoker   . Smokeless tobacco: Never Used  . Alcohol Use: No   OB History    No data available     Review of Systems  Constitutional: Negative for fever and chills.  Respiratory: Negative for cough and shortness of breath.   Cardiovascular: Positive for chest pain.  Gastrointestinal: Positive for nausea. Negative for vomiting and abdominal pain.  Neurological: Negative for dizziness, weakness and headaches.   Psychiatric/Behavioral: Negative for confusion.  All other systems reviewed and are negative.     Allergies  Review of patient's allergies indicates no known allergies.  Home Medications   Prior to Admission medications   Medication Sig Start Date End Date Taking? Authorizing Provider  citalopram (CELEXA) 10 MG tablet Take 10 mg by mouth daily. 11/14/14  Yes Historical Provider, MD  furosemide (LASIX) 20 MG tablet Take 20 mg by mouth daily. 12/20/14  Yes Historical Provider, MD  MAGNESIUM PO Take 1 tablet by mouth daily.   Yes Historical Provider, MD  benzonatate (TESSALON) 100 MG capsule Take 1 capsule (100 mg total) by mouth every 8 (eight) hours. Patient not taking: Reported on 12/28/2014 03/09/14   Rolland PorterMark James, MD  cyclobenzaprine (FLEXERIL) 10 MG tablet Take 1 tablet (10 mg total) by mouth 2 (two) times daily as needed for muscle spasms. Patient not taking: Reported on 12/28/2014 06/17/14   Donnetta HutchingBrian Cook, MD  fluconazole (DIFLUCAN) 200 MG tablet After Last dose of Antibiotic Patient not taking: Reported on 12/28/2014 03/09/14   Rolland PorterMark James, MD  HYDROcodone-acetaminophen (HYCET) 7.5-325 mg/15 ml solution Take 15 mLs by mouth every 4 (four) hours as needed for moderate pain or severe pain. Patient not taking: Reported on 12/28/2014 03/09/14   Rolland PorterMark James, MD  levofloxacin (LEVAQUIN) 500 MG tablet Take 1 tablet (500 mg total) by mouth daily. Patient not taking: Reported on 12/28/2014 03/09/14   Rolland PorterMark James, MD  naproxen (NAPROSYN) 500 MG tablet Take 1 tablet (500 mg total) by mouth  2 (two) times daily with a meal. 12/28/14   Shon Baton, MD   Triage Vitals: BP 105/57 mmHg  Pulse 61  Temp(Src) 97.9 F (36.6 C) (Oral)  Resp 14  SpO2 97%   Physical Exam  Constitutional: She is oriented to person, place, and time. She appears well-developed and well-nourished.  overweight  HENT:  Head: Normocephalic and atraumatic.  Cardiovascular: Normal rate, regular rhythm and normal heart sounds.   No  murmur heard. Pulmonary/Chest: Effort normal and breath sounds normal. No respiratory distress. She has no wheezes. She exhibits tenderness.  Abdominal: Soft. Bowel sounds are normal. There is no tenderness. There is no rebound and no guarding.  Musculoskeletal: She exhibits no edema.  Neurological: She is alert and oriented to person, place, and time.  Skin: Skin is warm and dry.  Psychiatric: She has a normal mood and affect.  Nursing note and vitals reviewed.   ED Course  Procedures (including critical care time)  DIAGNOSTIC STUDIES: Oxygen Saturation is 99% on RA, Normal by my interpretation.    COORDINATION OF CARE: 11:40 PM- Will give Nitroglyn, Morphine, and Zofran, Will order CXR, BNP, CBC, BMP, EKG, and pregnancy urine. Discussed treatment plan with pt at bedside and pt agreed to plan.     Labs Review Labs Reviewed  BASIC METABOLIC PANEL - Abnormal; Notable for the following:    Glucose, Bld 112 (*)    Calcium 8.7 (*)    All other components within normal limits  CBC WITH DIFFERENTIAL/PLATELET  TROPONIN I  BRAIN NATRIURETIC PEPTIDE  TROPONIN I  PREGNANCY, URINE    Imaging Review Dg Chest 2 View  12/28/2014   CLINICAL DATA:  Acute onset of generalized chest pain. Initial encounter.  EXAM: CHEST  2 VIEW  COMPARISON:  Chest radiograph from 06/17/2014  FINDINGS: The lungs are well-aerated. Mild vascular congestion is noted. There is no evidence of focal opacification, pleural effusion or pneumothorax.  The heart is normal in size; the mediastinal contour is within normal limits. No acute osseous abnormalities are seen.  IMPRESSION: Mild vascular congestion noted; lungs remain grossly clear.   Electronically Signed   By: Roanna Raider M.D.   On: 12/28/2014 00:25     EKG Interpretation   Date/Time:  Wednesday December 27 2014 22:42:38 EDT Ventricular Rate:  86 PR Interval:  156 QRS Duration: 86 QT Interval:  358 QTC Calculation: 428 R Axis:   78 Text Interpretation:   Sinus rhythm NO change from prior Confirmed by  [place] Souter  MD, Fable Huisman ([id number]) on 12/27/2014 11:03:50 PM      MDM   Final diagnoses:  Chest pain, unspecified chest pain type    Patient presents with chest pain.  Nontoxic.  EKG non-ischemic.  Low risk for ACS.  Patient has reproducible pain on exam.  PERC neg.  W/U including chest xray, trop x 2 and basic lab work reassuring.  Naproxen for pain and follow-up with cardiology as scheduled.  After history, exam, and medical workup I feel the patient has been appropriately medically screened and is safe for discharge home. Pertinent diagnoses were discussed with the patient. Patient was given return precautions.  I personally performed the services described in this documentation, which was scribed in my presence. The recorded information has been reviewed and is accurate.   Shon Baton, MD 12/28/14 [phone]

## 2014-12-28 ENCOUNTER — Emergency Department (HOSPITAL_COMMUNITY): Payer: 59

## 2014-12-28 LAB — CBC WITH DIFFERENTIAL/PLATELET
Basophils Absolute: 0 10*3/uL (ref 0.0–0.1)
Basophils Relative: 0 % (ref 0–1)
Eosinophils Absolute: 0.2 10*3/uL (ref 0.0–0.7)
Eosinophils Relative: 2 % (ref 0–5)
HCT: 39.7 % (ref 36.0–46.0)
Hemoglobin: 12.7 g/dL (ref 12.0–15.0)
Lymphocytes Relative: 32 % (ref 12–46)
Lymphs Abs: 2.7 10*3/uL (ref 0.7–4.0)
MCH: 26 pg (ref 26.0–34.0)
MCHC: 32 g/dL (ref 30.0–36.0)
MCV: 81.2 fL (ref 78.0–100.0)
Monocytes Absolute: 0.5 10*3/uL (ref 0.1–1.0)
Monocytes Relative: 6 % (ref 3–12)
Neutro Abs: 5.1 10*3/uL (ref 1.7–7.7)
Neutrophils Relative %: 60 % (ref 43–77)
Platelets: 253 10*3/uL (ref 150–400)
RBC: 4.89 MIL/uL (ref 3.87–5.11)
RDW: 15 % (ref 11.5–15.5)
WBC: 8.5 10*3/uL (ref 4.0–10.5)

## 2014-12-28 LAB — BASIC METABOLIC PANEL
Anion gap: 9 (ref 5–15)
BUN: 7 mg/dL (ref 6–20)
CO2: 22 mmol/L (ref 22–32)
Calcium: 8.7 mg/dL — ABNORMAL LOW (ref 8.9–10.3)
Chloride: 107 mmol/L (ref 101–111)
Creatinine, Ser: 0.63 mg/dL (ref 0.44–1.00)
GFR calc Af Amer: >60 mL/min (ref >60)
GFR calc non Af Amer: >60 mL/min (ref >60)
Glucose, Bld: 112 mg/dL — ABNORMAL HIGH (ref 65–99)
Potassium: 3.6 mmol/L (ref 3.5–5.1)
Sodium: 138 mmol/L (ref 135–145)

## 2014-12-28 LAB — BRAIN NATRIURETIC PEPTIDE: B Natriuretic Peptide: 20.7 pg/mL (ref 0.0–100.0)

## 2014-12-28 LAB — TROPONIN I
Troponin I: <0.03 ng/mL (ref <0.031)
Troponin I: <0.03 ng/mL (ref <0.031)

## 2014-12-28 MED ORDER — KETOROLAC TROMETHAMINE 30 MG/ML IJ SOLN
30.0000 mg | Freq: Once | INTRAMUSCULAR | Status: AC
  Administered 2014-12-28: 30 mg via INTRAVENOUS
  Filled 2014-12-28: qty 1

## 2014-12-28 MED ORDER — OXYCODONE-ACETAMINOPHEN 5-325 MG PO TABS
1.0000 | ORAL_TABLET | Freq: Once | ORAL | Status: AC
  Administered 2014-12-28: 1 via ORAL
  Filled 2014-12-28: qty 1

## 2014-12-28 MED ORDER — NAPROXEN 500 MG PO TABS: 500.0000 mg | ORAL_TABLET | Freq: Two times a day (BID) | ORAL | Status: DC

## 2014-12-28 NOTE — Discharge Instructions (Signed)
You were seen today for chest pain. Some components of your chest pain suggests chest wall pain. You need to take anti-inflammatory is at home. He should also follow-up with cardiology as previously scheduled. All your testing here is reassuring.  Chest Pain (Nonspecific) It is often hard to give a specific diagnosis for the cause of chest pain. There is always a chance that your pain could be related to something serious, such as a heart attack or a blood clot in the lungs. You need to follow up with your health care provider for further evaluation. CAUSES   Heartburn.  Pneumonia or bronchitis.  Anxiety or stress.  Inflammation around your heart (pericarditis) or lung (pleuritis or pleurisy).  A blood clot in the lung.  A collapsed lung (pneumothorax). It can develop suddenly on its own (spontaneous pneumothorax) or from trauma to the chest.  Shingles infection (herpes zoster virus). The chest wall is composed of bones, muscles, and cartilage. Any of these can be the source of the pain.  The bones can be bruised by injury.  The muscles or cartilage can be strained by coughing or overwork.  The cartilage can be affected by inflammation and become sore (costochondritis). DIAGNOSIS  Lab tests or other studies may be needed to find the cause of your pain. Your health care provider may have you take a test called an ambulatory electrocardiogram (ECG). An ECG records your heartbeat patterns over a 24-hour period. You may also have other tests, such as:  Transthoracic echocardiogram (TTE). During echocardiography, sound waves are used to evaluate how blood flows through your heart.  Transesophageal echocardiogram (TEE).  Cardiac monitoring. This allows your health care provider to monitor your heart rate and rhythm in real time.  Holter monitor. This is a portable device that records your heartbeat and can help diagnose heart arrhythmias. It allows your health care provider to track your  heart activity for several days, if needed.  Stress tests by exercise or by giving medicine that makes the heart beat faster. TREATMENT   Treatment depends on what may be causing your chest pain. Treatment may include:  Acid blockers for heartburn.  Anti-inflammatory medicine.  Pain medicine for inflammatory conditions.  Antibiotics if an infection is present.  You may be advised to change lifestyle habits. This includes stopping smoking and avoiding alcohol, caffeine, and chocolate.  You may be advised to keep your head raised (elevated) when sleeping. This reduces the chance of acid going backward from your stomach into your esophagus. Most of the time, nonspecific chest pain will improve within 2-3 days with rest and mild pain medicine.  HOME CARE INSTRUCTIONS   If antibiotics were prescribed, take them as directed. Finish them even if you start to feel better.  For the next few days, avoid physical activities that bring on chest pain. Continue physical activities as directed.  Do not use any tobacco products, including cigarettes, chewing tobacco, or electronic cigarettes.  Avoid drinking alcohol.  Only take medicine as directed by your health care provider.  Follow your health care provider's suggestions for further testing if your chest pain does not go away.  Keep any follow-up appointments you made. If you do not go to an appointment, you could develop lasting (chronic) problems with pain. If there is any problem keeping an appointment, call to reschedule. SEEK MEDICAL CARE IF:   Your chest pain does not go away, even after treatment.  You have a rash with blisters on your chest.  You have  a fever. SEEK IMMEDIATE MEDICAL CARE IF:   You have increased chest pain or pain that spreads to your arm, neck, jaw, back, or abdomen.  You have shortness of breath.  You have an increasing cough, or you cough up blood.  You have severe back or abdominal pain.  You feel  nauseous or vomit.  You have severe weakness.  You faint.  You have chills. This is an emergency. Do not wait to see if the pain will go away. Get medical help at once. Call your local emergency services (911 in [place] Do not drive yourself to the hospital. MAKE SURE YOU:   Understand these instructions.  Will watch your condition.  Will get help right away if you are not doing well or get worse. Document Released: 03/19/2005 Document Revised: 06/14/2013 Document Reviewed: 01/13/2008 [facility] Patient Information 2015 [employer] [place] [place]. This information is not intended to replace advice given to you by your health care provider. Make sure you discuss any questions you have with your health care provider.

## 2014-12-28 NOTE — ED Notes (Signed)
Pt left with all belongings and refused wheelchair. Pt was unable to sign discharge papers due to computer down time.

## 2014-12-30 ENCOUNTER — Encounter (HOSPITAL_COMMUNITY): Payer: Self-pay | Admitting: *Deleted

## 2014-12-30 ENCOUNTER — Emergency Department (HOSPITAL_COMMUNITY)
Admission: EM | Admit: 2014-12-30 | Discharge: 2014-12-30 | Disposition: A | Discharge status: Home / Self Care | Payer: 59 | Attending: Emergency Medicine | Admitting: Emergency Medicine

## 2014-12-30 DIAGNOSIS — Z79899 Other long term (current) drug therapy: Secondary | ICD-10-CM | POA: Insufficient documentation

## 2014-12-30 DIAGNOSIS — R1010 Upper abdominal pain, unspecified: Secondary | ICD-10-CM | POA: Insufficient documentation

## 2014-12-30 DIAGNOSIS — R079 Chest pain, unspecified: Secondary | ICD-10-CM | POA: Insufficient documentation

## 2014-12-30 DIAGNOSIS — Z791 Long term (current) use of non-steroidal anti-inflammatories (NSAID): Secondary | ICD-10-CM | POA: Diagnosis not present

## 2014-12-30 DIAGNOSIS — R112 Nausea with vomiting, unspecified: Secondary | ICD-10-CM | POA: Diagnosis not present

## 2014-12-30 DIAGNOSIS — Z3202 Encounter for pregnancy test, result negative: Secondary | ICD-10-CM | POA: Insufficient documentation

## 2014-12-30 LAB — CBC WITH DIFFERENTIAL/PLATELET
Basophils Absolute: 0 10*3/uL (ref 0.0–0.1)
Basophils Relative: 0 % (ref 0–1)
Eosinophils Absolute: 0.2 10*3/uL (ref 0.0–0.7)
Eosinophils Relative: 2 % (ref 0–5)
HCT: 40.2 % (ref 36.0–46.0)
Hemoglobin: 12.8 g/dL (ref 12.0–15.0)
Lymphocytes Relative: 40 % (ref 12–46)
Lymphs Abs: 3.1 10*3/uL (ref 0.7–4.0)
MCH: 25.9 pg — ABNORMAL LOW (ref 26.0–34.0)
MCHC: 31.8 g/dL (ref 30.0–36.0)
MCV: 81.2 fL (ref 78.0–100.0)
Monocytes Absolute: 0.6 10*3/uL (ref 0.1–1.0)
Monocytes Relative: 7 % (ref 3–12)
Neutro Abs: 3.8 10*3/uL (ref 1.7–7.7)
Neutrophils Relative %: 51 % (ref 43–77)
Platelets: 296 10*3/uL (ref 150–400)
RBC: 4.95 MIL/uL (ref 3.87–5.11)
RDW: 15 % (ref 11.5–15.5)
WBC: 7.7 10*3/uL (ref 4.0–10.5)

## 2014-12-30 LAB — COMPREHENSIVE METABOLIC PANEL
ALT: 19 U/L (ref 14–54)
AST: 18 U/L (ref 15–41)
Albumin: 3.8 g/dL (ref 3.5–5.0)
Alkaline Phosphatase: 77 U/L (ref 38–126)
Anion gap: 7 (ref 5–15)
BUN: 10 mg/dL (ref 6–20)
CO2: 26 mmol/L (ref 22–32)
Calcium: 8.4 mg/dL — ABNORMAL LOW (ref 8.9–10.3)
Chloride: 104 mmol/L (ref 101–111)
Creatinine, Ser: 0.68 mg/dL (ref 0.44–1.00)
GFR calc Af Amer: >60 mL/min (ref >60)
GFR calc non Af Amer: >60 mL/min (ref >60)
Glucose, Bld: 103 mg/dL — ABNORMAL HIGH (ref 65–99)
Potassium: 3.4 mmol/L — ABNORMAL LOW (ref 3.5–5.1)
Sodium: 137 mmol/L (ref 135–145)
Total Bilirubin: 0.5 mg/dL (ref 0.3–1.2)
Total Protein: 7.4 g/dL (ref 6.5–8.1)

## 2014-12-30 LAB — URINALYSIS, ROUTINE W REFLEX MICROSCOPIC
Bilirubin Urine: NEGATIVE
Glucose, UA: NEGATIVE mg/dL
Hgb urine dipstick: NEGATIVE
Ketones, ur: NEGATIVE mg/dL
Leukocytes, UA: NEGATIVE
Nitrite: NEGATIVE
Protein, ur: NEGATIVE mg/dL
Specific Gravity, Urine: 1.02 (ref 1.005–1.030)
Urobilinogen, UA: 0.2 mg/dL (ref 0.0–1.0)
pH: 6 (ref 5.0–8.0)

## 2014-12-30 LAB — I-STAT TROPONIN, ED: Troponin i, poc: 0 ng/mL (ref 0.00–0.08)

## 2014-12-30 LAB — LIPASE, BLOOD: Lipase: 28 U/L (ref 22–51)

## 2014-12-30 LAB — POC URINE PREG, ED: Preg Test, Ur: NEGATIVE

## 2014-12-30 MED ORDER — SODIUM CHLORIDE 0.9 % IV BOLUS (SEPSIS)
500.0000 mL | Freq: Once | INTRAVENOUS | Status: AC
  Administered 2014-12-30: 500 mL via INTRAVENOUS

## 2014-12-30 MED ORDER — OXYCODONE-ACETAMINOPHEN 5-325 MG PO TABS: 2.0000 | ORAL_TABLET | ORAL | Status: DC | PRN

## 2014-12-30 MED ORDER — ONDANSETRON HCL 4 MG/2ML IJ SOLN
4.0000 mg | Freq: Once | INTRAMUSCULAR | Status: AC
  Administered 2014-12-30: 4 mg via INTRAVENOUS
  Filled 2014-12-30: qty 2

## 2014-12-30 MED ORDER — ONDANSETRON 8 MG PO TBDP: 8.0000 mg | ORAL_TABLET | Freq: Three times a day (TID) | ORAL | Status: DC | PRN

## 2014-12-30 MED ORDER — PANTOPRAZOLE SODIUM 20 MG PO TBEC: 20.0000 mg | DELAYED_RELEASE_TABLET | Freq: Every day | ORAL | Status: DC

## 2014-12-30 MED ORDER — MORPHINE SULFATE 2 MG/ML IJ SOLN
2.0000 mg | Freq: Once | INTRAMUSCULAR | Status: AC
Start: 2014-12-30 — End: 2014-12-30
  Administered 2014-12-30: 2 mg via INTRAVENOUS
  Filled 2014-12-30: qty 1

## 2014-12-30 MED ORDER — OXYCODONE-ACETAMINOPHEN 5-325 MG PO TABS
2.0000 | ORAL_TABLET | Freq: Once | ORAL | Status: AC
  Administered 2014-12-30: 2 via ORAL
  Filled 2014-12-30: qty 2

## 2014-12-30 MED ORDER — PANTOPRAZOLE SODIUM 40 MG IV SOLR
INTRAVENOUS | Status: AC
  Filled 2014-12-30: qty 80

## 2014-12-30 MED ORDER — SODIUM CHLORIDE 0.9 % IV SOLN
80.0000 mg | Freq: Every day | INTRAVENOUS | Status: DC
  Administered 2014-12-30: 80 mg via INTRAVENOUS
  Filled 2014-12-30 (×2): qty 80

## 2014-12-30 NOTE — ED Notes (Signed)
Notified wynnie, AC to mix protonix.

## 2014-12-30 NOTE — ED Notes (Signed)
Discharge instructions given, pt demonstrated teach back and verbal understanding. No concerns voiced.  

## 2014-12-30 NOTE — ED Notes (Signed)
2 week history of L chest pain which worsens after eating. Associated n/v/ SOB. [facility]. Cardiac markers were negative.

## 2014-12-30 NOTE — Discharge Instructions (Signed)

## 2014-12-30 NOTE — ED Provider Notes (Signed)
CSN: [id number]     Arrival date & time 12/30/14  1419 History   First MD Initiated Contact with Patient 12/30/14 1433     Chief Complaint  Patient presents with  . Chest Pain     (Consider location/radiation/quality/duration/timing/severity/associated sxs/prior Treatment) Patient is a 39 y.o. female presenting with chest pain. The history is provided by the patient.  Chest Pain  39 year old previously healthy female comes in today saying she has been having upper abdominal pain for 2 weeks. Pain increases with any by mouth intake. She has associated nausea and some vomiting. Pain radiates to her left shoulder blade up to her left neck. She has previously been seen at [facility] for similar symptoms. Workup there was negative. It appears workup at that time was chiefly done for chest pain and she had negative troponins and a normal EKG and was felt to be low risk for ACS. History reviewed. No pertinent past medical history. History reviewed. No pertinent past surgical history. Family History  Problem Relation Age of Onset  . Kidney disease Mother   . Cancer Father    History  Substance Use Topics  . Smoking status: Never Smoker   . Smokeless tobacco: Never Used  . Alcohol Use: No   OB History    No data available     Review of Systems  Cardiovascular: Positive for chest pain.  All other systems reviewed and are negative.     Allergies  Review of patient's allergies indicates no known allergies.  Home Medications   Prior to Admission medications   Medication Sig Start Date End Date Taking? Authorizing Provider  citalopram (CELEXA) 10 MG tablet Take 10 mg by mouth daily. 11/14/14  Yes Historical Provider, MD  furosemide (LASIX) 20 MG tablet Take 20 mg by mouth daily. 12/20/14  Yes Historical Provider, MD  MAGNESIUM PO Take 1 tablet by mouth daily.   Yes Historical Provider, MD  naproxen (NAPROSYN) 500 MG tablet Take 1 tablet (500 mg total) by mouth 2 (two) times daily with  a meal. 12/28/14  Yes Shon Baton, MD  vitamin C (ASCORBIC ACID) 500 MG tablet Take 500 mg by mouth daily.   Yes Historical Provider, MD  vitamin E 400 UNIT capsule Take 400 Units by mouth daily.   Yes Historical Provider, MD  ondansetron (ZOFRAN ODT) 8 MG disintegrating tablet Take 1 tablet (8 mg total) by mouth every 8 (eight) hours as needed for nausea or vomiting. 12/30/14   Margarita Grizzle, MD  oxyCODONE-acetaminophen (PERCOCET/ROXICET) 5-325 MG per tablet Take 2 tablets by mouth every 4 (four) hours as needed for severe pain. 12/30/14   Margarita Grizzle, MD  pantoprazole (PROTONIX) 20 MG tablet Take 1 tablet (20 mg total) by mouth daily. 12/30/14   Margarita Grizzle, MD   BP 100/70 mmHg  Pulse 79  Temp(Src) 98.1 F (36.7 C) (Oral)  Resp 20  Ht  (1.6 m)  Wt 264 lb (119.75 kg)  BMI 46.78 kg/m2  SpO2 100%  LMP 11/26/2014 Physical Exam  Constitutional: She is oriented to person, place, and time. She appears well-developed and well-nourished.  HENT:  Head: Normocephalic and atraumatic.  Right Ear: External ear normal.  Left Ear: External ear normal.  Nose: Nose normal.  Mouth/Throat: Oropharynx is clear and moist.  Eyes: Conjunctivae and EOM are normal. Pupils are equal, round, and reactive to light.  Neck: Normal range of motion. Neck supple. No JVD present. No tracheal deviation present. No thyromegaly present.  Cardiovascular: Normal  rate, regular rhythm, normal heart sounds and intact distal pulses.   Pulmonary/Chest: Effort normal and breath sounds normal. She has no wheezes.  Abdominal: Soft. Bowel sounds are normal. She exhibits no mass. There is tenderness. There is no guarding.    Musculoskeletal: Normal range of motion.  Lymphadenopathy:    She has no cervical adenopathy.  Neurological: She is alert and oriented to person, place, and time. She has normal reflexes. No cranial nerve deficit or sensory deficit. Gait normal. GCS eye subscore is 4. GCS verbal subscore is 5. GCS motor  subscore is 6.  Reflex Scores:      Bicep reflexes are 2+ on the right side and 2+ on the left side.      Patellar reflexes are 2+ on the right side and 2+ on the left side. Strength is normal and equal throughout. Cranial nerves grossly intact. Patient fluent. No gross ataxia and patient able to ambulate without difficulty.  Skin: Skin is warm and dry.  Psychiatric: She has a normal mood and affect. Her behavior is normal. Judgment and thought content normal.  Nursing note and vitals reviewed.   ED Course  Procedures (including critical care time) Labs Review Labs Reviewed  CBC WITH DIFFERENTIAL/PLATELET - Abnormal; Notable for the following:    MCH 25.9 (*)    All other components within normal limits  COMPREHENSIVE METABOLIC PANEL - Abnormal; Notable for the following:    Potassium 3.4 (*)    Glucose, Bld 103 (*)    Calcium 8.4 (*)    All other components within normal limits  URINALYSIS, ROUTINE W REFLEX MICROSCOPIC (NOT AT [facility]) - Abnormal; Notable for the following:    APPearance HAZY (*)    All other components within normal limits  LIPASE, BLOOD  I-STAT TROPOININ, ED  POC URINE PREG, ED    Imaging Review No results found.   EKG Interpretation   Date/Time:  Saturday December 30 2014 14:29:10 EDT Ventricular Rate:  81 PR Interval:  151 QRS Duration: 86 QT Interval:  369 QTC Calculation: 428 R Axis:   49 Text Interpretation:  Sinus rhythm Normal ECG Confirmed by Dariush Mcnellis MD,  Duwayne HeckANIELLE ([id number]) on 12/30/2014 3:16:08 PM      MDM   Final diagnoses:  Upper abdominal pain   Patient presents today with upper abdominal pain. She has had associated nausea and vomiting. Abdomen is soft although there is some tenderness to palpation in the epigastrium. Labs are essentially normal here without elevated lipase, white blood cell count, or liver function tests. She has had an abdominal ultrasound several years ago but has not had any recent evaluation for gallstones. Differential  diagnosis includes pancreatitis, gastritis, and biliary colic. Other less likely diagnoses include splenic infarcts, regional enteritis all of which I have low index of suspicion for. 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  Plan op us and follow up with GI.  RX protonix, percocet, and zofran.    Margarita Grizzleanielle Delesia Martinek, MD 12/30/14 [phone]

## 2014-12-31 ENCOUNTER — Ambulatory Visit (HOSPITAL_COMMUNITY)
Admit: 2014-12-31 | Discharge: 2014-12-31 | Disposition: A | Discharge status: Home / Self Care | Payer: 59 | Source: Ambulatory Visit | Attending: Emergency Medicine | Admitting: Emergency Medicine

## 2014-12-31 ENCOUNTER — Other Ambulatory Visit (HOSPITAL_COMMUNITY): Payer: Self-pay | Admitting: Emergency Medicine

## 2014-12-31 DIAGNOSIS — R1010 Upper abdominal pain, unspecified: Secondary | ICD-10-CM

## 2014-12-31 DIAGNOSIS — R110 Nausea: Secondary | ICD-10-CM | POA: Insufficient documentation

## 2014-12-31 DIAGNOSIS — R1013 Epigastric pain: Secondary | ICD-10-CM | POA: Diagnosis not present

## 2014-12-31 NOTE — ED Notes (Signed)
Patient waiting in RP area for US results. Registration states patient up to desk multiple times asking about wait time. RN had been checking for results several times and still pending read by radiology at present time. Patient states she wants to leave. Registration explained to patient that was her choice and that she could go to medical records for her results, but that a provider would not be available to explain them to her if she chose to get her results that way. Patient stated she was leaving. MD made aware that patient left.

## 2015-01-23 ENCOUNTER — Ambulatory Visit: Payer: 59 | Admitting: Cardiology

## 2015-01-30 ENCOUNTER — Encounter: Payer: Self-pay | Admitting: *Deleted

## 2015-01-31 ENCOUNTER — Ambulatory Visit: Payer: 59 | Admitting: Cardiology

## 2015-04-09 ENCOUNTER — Ambulatory Visit (INDEPENDENT_AMBULATORY_CARE_PROVIDER_SITE_OTHER): Payer: BLUE CROSS/BLUE SHIELD | Admitting: Obstetrics & Gynecology

## 2015-04-09 ENCOUNTER — Encounter: Payer: Self-pay | Admitting: Obstetrics & Gynecology

## 2015-04-09 VITALS — BP 130/80 | HR 68 | Ht 63.0 in | Wt 270.0 lb

## 2015-04-09 DIAGNOSIS — N926 Irregular menstruation, unspecified: Secondary | ICD-10-CM

## 2015-04-25 NOTE — Progress Notes (Signed)
Patient ID: Gwendolyn Edwards, female   DOB: 05-10-1976, 39 y.o.   MRN: [id number] Chief Complaint  Patient presents with  . want to get pregnant    irregular period.    Blood pressure 130/80, pulse 68, height 5\' 3"  (1.6 m), weight 270 lb (122.471 kg), last menstrual period 03/26/2015.  Pt comes in with desire to achieve pregnancy She had a spontaneous loss earlier this year She had a successful pregnancy 6 years ago or so Her periods are increasingly irregular and underwent reproductive endo eval and treatment at that time  Given her age history and current probable irregular ovualtory status i have recommended pt [facility] and [facility].  Oocyte donate, ICXY are likely to be recommended  She understands and knows simply using clomid will not be statistically appropriate given her multidtude of past and present issues     Face to face time:  20 minutes  Greater than 50% of the visit time was spent in counseling and coordination of care with the patient.  The summary and outline of the counseling and care coordination is summarized in the note above.   All questions were answered.

## 2015-05-08 ENCOUNTER — Telehealth: Payer: Self-pay | Admitting: *Deleted

## 2015-05-08 NOTE — Telephone Encounter (Signed)
Pt states she is having artificial insemination but employer wants her to get the vaccination for chicken pox. Pt says does she need to do this while trying to get pregnant?

## 2015-05-08 NOTE — Telephone Encounter (Signed)
The vaccine is a live attenuated virus meaning it is weakened but still live I would recommend waiting until after her insemination cycle is concluded

## 2016-02-29 ENCOUNTER — Emergency Department (HOSPITAL_COMMUNITY)
Admission: EM | Admit: 2016-02-29 | Discharge: 2016-02-29 | Disposition: A | Discharge status: Home / Self Care | Payer: 59 | Attending: Emergency Medicine | Admitting: Emergency Medicine

## 2016-02-29 ENCOUNTER — Encounter (HOSPITAL_COMMUNITY): Payer: Self-pay | Admitting: Emergency Medicine

## 2016-02-29 DIAGNOSIS — Z79899 Other long term (current) drug therapy: Secondary | ICD-10-CM | POA: Insufficient documentation

## 2016-02-29 DIAGNOSIS — J039 Acute tonsillitis, unspecified: Secondary | ICD-10-CM | POA: Diagnosis not present

## 2016-02-29 LAB — RAPID STREP SCREEN (MED CTR MEBANE ONLY): Streptococcus, Group A Screen (Direct): NEGATIVE

## 2016-02-29 MED ORDER — NAPROXEN 250 MG PO TABS
500.0000 mg | ORAL_TABLET | Freq: Once | ORAL | Status: AC
  Administered 2016-02-29: 500 mg via ORAL
  Filled 2016-02-29: qty 2

## 2016-02-29 MED ORDER — CEPHALEXIN 500 MG PO CAPS
500.0000 mg | ORAL_CAPSULE | Freq: Once | ORAL | Status: AC
  Administered 2016-02-29: 500 mg via ORAL
  Filled 2016-02-29: qty 1

## 2016-02-29 MED ORDER — CEPHALEXIN 500 MG PO CAPS: 500.0000 mg | ORAL_CAPSULE | Freq: Two times a day (BID) | ORAL | 0 refills | Status: DC

## 2016-02-29 MED ORDER — NAPROXEN 500 MG PO TABS: 500.0000 mg | ORAL_TABLET | Freq: Two times a day (BID) | ORAL | 0 refills | Status: DC

## 2016-02-29 MED ORDER — AZITHROMYCIN 250 MG PO TABS
500.0000 mg | ORAL_TABLET | Freq: Once | ORAL | Status: DC
  Filled 2016-02-29: qty 2

## 2016-02-29 NOTE — ED Triage Notes (Addendum)
Patient c/o sore throat and right side neck pain that radiates into right ear. Per patient she had same symptoms 2 weeks ago with fever that resolved on its on. Denies any fevers. Patient reports recently being exposed to someone with strep.

## 2016-02-29 NOTE — Discharge Instructions (Signed)
Take your next dose of your antibiotic tomorrow morning.

## 2016-03-01 NOTE — ED Provider Notes (Signed)
[facility]Y DEPT Provider Note   CSN: [id number] Arrival date & time: 02/29/16  1707     History   Chief Complaint Chief Complaint  Patient presents with  . Sore Throat    HPI Gwendolyn Edwards is a 40 y.o. female.  The history is provided by the patient.  Sore Throat  This is a recurrent problem. The current episode started 2 days ago. The problem occurs constantly. The problem has been gradually worsening. Pertinent negatives include no chest pain, no abdominal pain, no headaches and no shortness of breath. The symptoms are aggravated by swallowing (describes razor blade like pain with swallowing). Nothing relieves the symptoms. The treatment provided no (she took aleve 220 mg this am with moderate relief of pain.) relief.   Pt states was treated with a 7 day course of either augmentin or amoxil (not sure which) finishing this medication 2 weeks ago at which time she was diagnosed with acute tonsillitis.  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 She denies fevers, chills, rash, abdominal pain.   Past Medical History:  Diagnosis Date  . Anxiety     There are no active problems to display for this patient.   Past Surgical History:  Procedure Laterality Date  . MOUTH SURGERY      OB History    No data available       Home Medications    Prior to Admission medications   Medication Sig Start Date End Date Taking? Authorizing Provider  furosemide (LASIX) 20 MG tablet Take 20 mg by mouth daily as needed for fluid.  12/20/14  Yes Historical Provider, MD  MAGNESIUM PO Take 1 tablet by mouth daily.   Yes Historical Provider, MD  vitamin C (ASCORBIC ACID) 500 MG tablet Take 500 mg by mouth daily.   Yes Historical Provider, MD  vitamin E 400 UNIT capsule Take 400 Units by mouth daily.   Yes Historical Provider, MD  cephALEXin (KEFLEX) 500 MG capsule Take 1 capsule (500 mg total) by mouth 2 (two) times  daily. 02/29/16   Burgess Amor, PA-C  naproxen (NAPROSYN) 500 MG tablet Take 1 tablet (500 mg total) by mouth 2 (two) times daily. 02/29/16   Burgess Amor, PA-C    Family History Family History  Problem Relation Age of Onset  . Kidney disease Mother   . Hepatitis C Mother   . Hypertension Mother   . Cancer Father     Social History Social History  Substance Use Topics  . Smoking status: Never Smoker  . Smokeless tobacco: Never Used  . Alcohol use No     Allergies   Review of patient's allergies indicates no known allergies.   Review of Systems Review of Systems  Constitutional: Negative for chills and fever.  HENT: Positive for sore throat. Negative for congestion, ear pain, rhinorrhea, sinus pressure, trouble swallowing and voice change.   Eyes: Negative for discharge.  Respiratory: Negative for cough, shortness of breath, wheezing and stridor.   Cardiovascular: Negative for chest pain.  Gastrointestinal: Negative for abdominal pain.  Genitourinary: Negative.   Skin: Negative for rash.  Neurological: Negative for headaches.     Physical Exam Updated Vital Signs BP 139/79 (BP Location: Right Arm)   Pulse 80   Temp 98.3 F (36.8 C) (Oral)   Resp 18   Ht 5\' 3"  (1.6 m)   Wt 119.3 kg   LMP 02/22/2016   SpO2 97%  BMI 46.59 kg/m   Physical Exam  HENT:  Head: Normocephalic.  Right Ear: Tympanic membrane and external ear normal.  Left Ear: Tympanic membrane and external ear normal.  Nose: Nose normal.  Mouth/Throat: No trismus in the jaw. No uvula swelling. Posterior oropharyngeal edema and posterior oropharyngeal erythema present. Tonsils are 2+ on the right. Tonsils are 1+ on the left. No tonsillar exudate.  Right greater than left tonsillar edema, but soft, not tense or with suggestion of abscess.  Moderate erythema of right tonsil with scattered petechiae of hard palate.  No exudate.  Bilateral tonsillar adenopathy.     ED Treatments / Results  Labs (all labs  ordered are listed, but only abnormal results are displayed) Labs Reviewed  RAPID STREP SCREEN (NOT AT [facility])  CULTURE, GROUP A STREP [facility])    EKG  EKG Interpretation None       Radiology No results found.  Procedures Procedures (including critical care time)  Medications Ordered in ED Medications  naproxen (NAPROSYN) tablet 500 mg (500 mg Oral Given 02/29/16 2107)  cephALEXin (KEFLEX) capsule 500 mg (500 mg Oral Given 02/29/16 2114)     Initial Impression / Assessment and Plan / ED Course  I have reviewed the triage vital signs and the nursing notes.  Pertinent labs & imaging results that were available during my care of the patient were reviewed by me and considered in my medical decision making (see chart for details).  Clinical Course    Pt placed on keflex given known exposure to strep and recent course of amoxil/augmentin.  Prescribed naproxen for pain relief. Plan f/u with pcp if sx persist or worsen.  Final Clinical Impressions(s) / ED Diagnoses   Final diagnoses:  Acute tonsillitis, unspecified etiology    New Prescriptions Discharge Medication List as of 02/29/2016  9:07 PM    START taking these medications   Details  cephALEXin (KEFLEX) 500 MG capsule Take 1 capsule (500 mg total) by mouth 2 (two) times daily., Starting Fri 02/29/2016, Print    naproxen (NAPROSYN) 500 MG tablet Take 1 tablet (500 mg total) by mouth 2 (two) times daily., Starting Fri 02/29/2016, Print         Burgess AmorJulie Azalya Galyon, PA-C 03/01/16 1244    Donnetta HutchingBrian Cook, MD 03/03/16 [phone]

## 2016-03-04 LAB — CULTURE, GROUP A STREP (THRC)

## 2016-08-03 IMAGING — DX DG CHEST 2V
2 series · 2 of 2 positions shown · non-contrast
Comparison: Chest radiograph from 06/17/2014

CLINICAL DATA: Acute onset of generalized chest pain. Initial
encounter.

EXAM:
CHEST  2 VIEW

[chest pa]
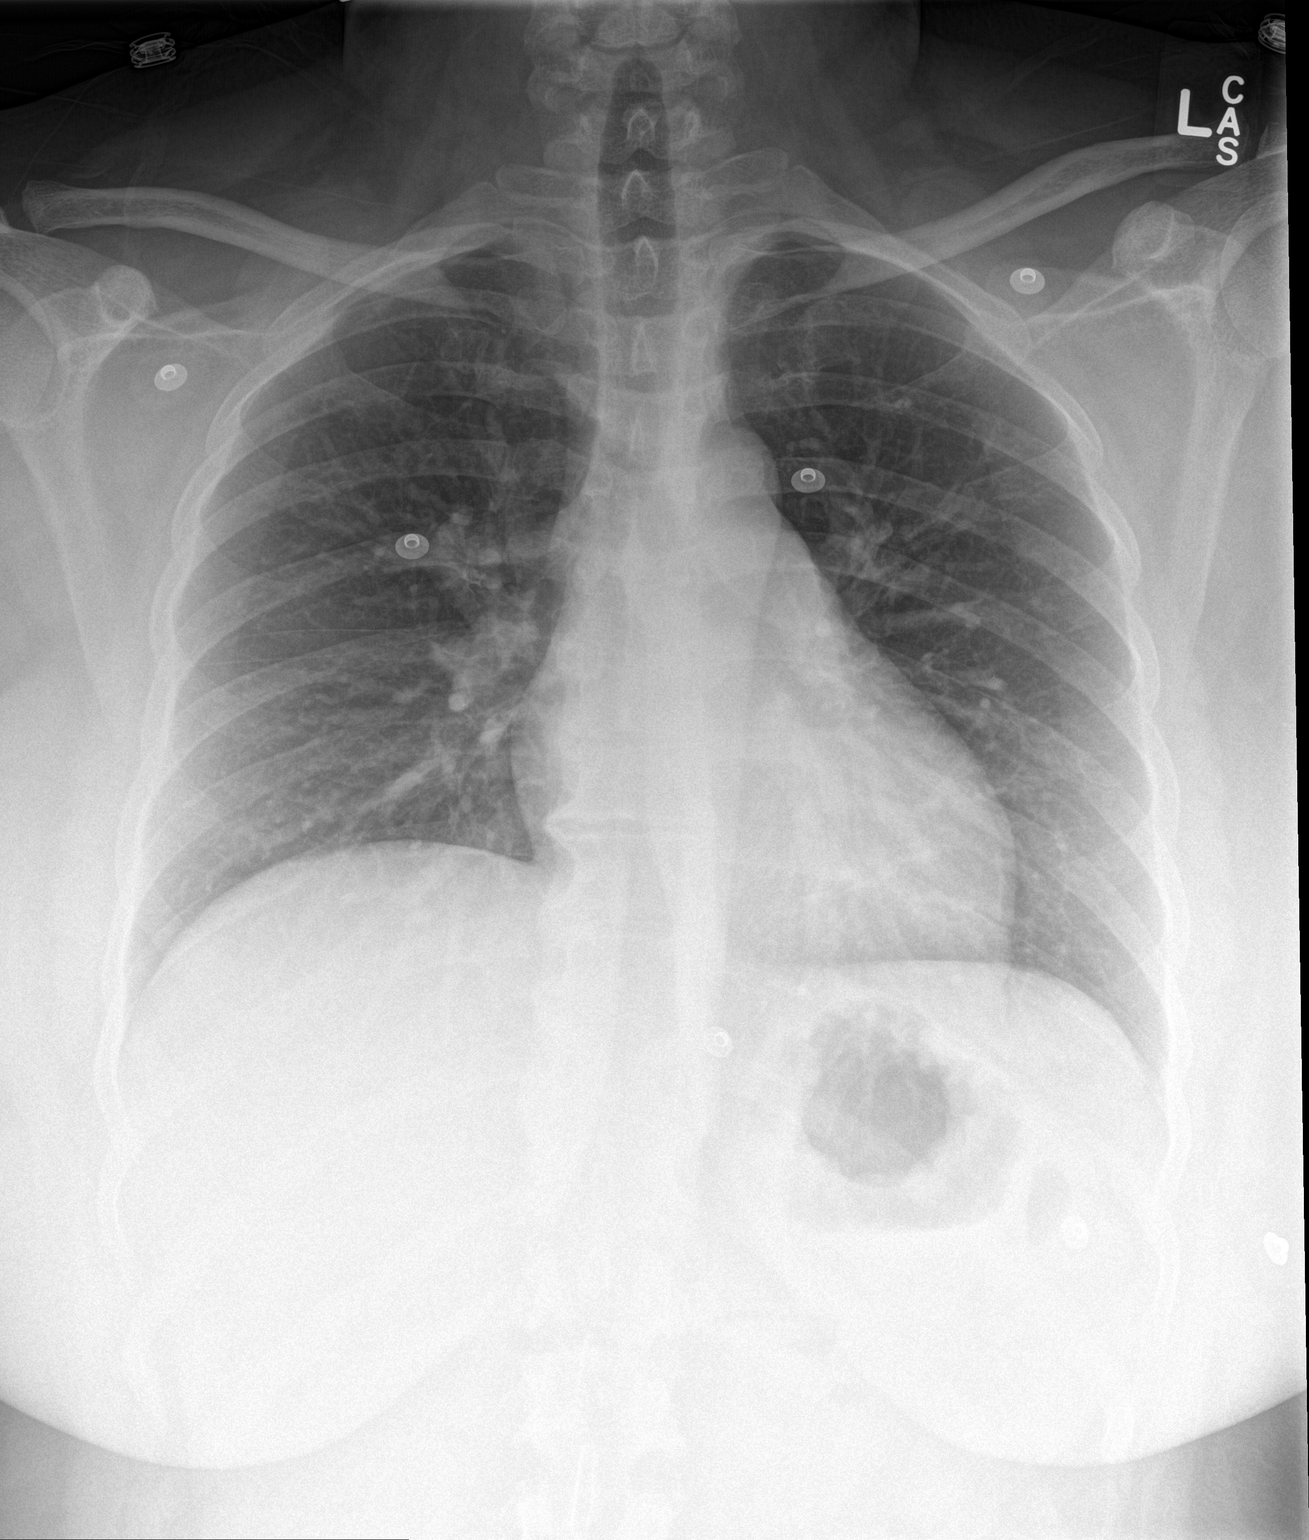

[chest lat]
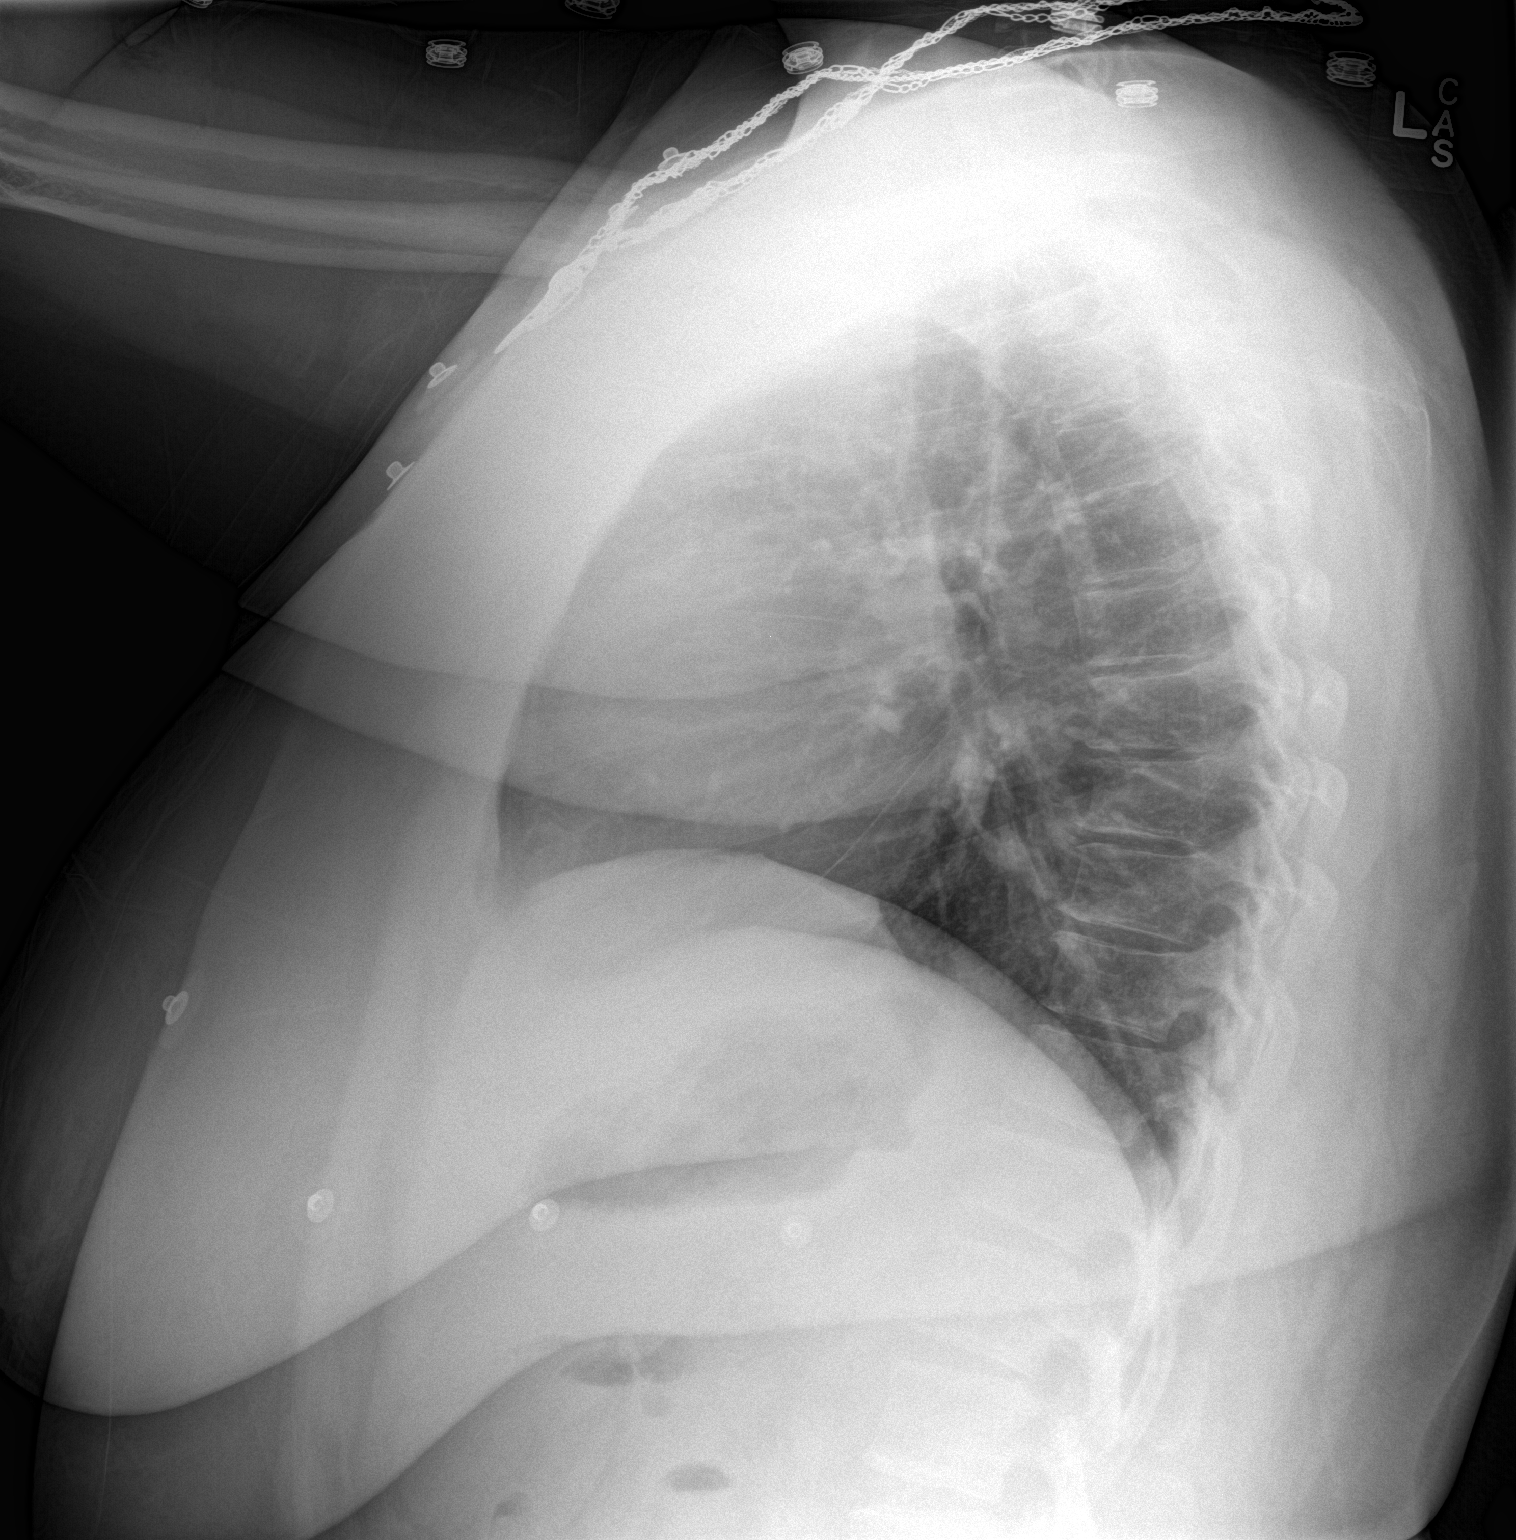

[2 of 2 positions shown; findings below may reference images not displayed]

FINDINGS: The lungs are well-aerated. Mild vascular congestion is noted. There
is no evidence of focal opacification, pleural effusion or
pneumothorax.

The heart is normal in size; the mediastinal contour is within
normal limits. No acute osseous abnormalities are seen.
IMPRESSION: Mild vascular congestion noted; lungs remain grossly clear.

## 2016-08-04 ENCOUNTER — Encounter (HOSPITAL_COMMUNITY): Payer: Self-pay | Admitting: Emergency Medicine

## 2016-08-04 ENCOUNTER — Emergency Department (HOSPITAL_COMMUNITY): Payer: BLUE CROSS/BLUE SHIELD

## 2016-08-04 ENCOUNTER — Emergency Department (HOSPITAL_COMMUNITY)
Admission: EM | Admit: 2016-08-04 | Discharge: 2016-08-04 | Disposition: A | Discharge status: Home / Self Care | Payer: BLUE CROSS/BLUE SHIELD | Attending: Emergency Medicine | Admitting: Emergency Medicine

## 2016-08-04 DIAGNOSIS — Z79899 Other long term (current) drug therapy: Secondary | ICD-10-CM | POA: Insufficient documentation

## 2016-08-04 DIAGNOSIS — L0390 Cellulitis, unspecified: Secondary | ICD-10-CM

## 2016-08-04 DIAGNOSIS — L0231 Cutaneous abscess of buttock: Secondary | ICD-10-CM | POA: Insufficient documentation

## 2016-08-04 DIAGNOSIS — L03317 Cellulitis of buttock: Secondary | ICD-10-CM | POA: Diagnosis not present

## 2016-08-04 LAB — CBC WITH DIFFERENTIAL/PLATELET
Basophils Absolute: 0 10*3/uL (ref 0.0–0.1)
Basophils Relative: 0 %
Eosinophils Absolute: 0.2 10*3/uL (ref 0.0–0.7)
Eosinophils Relative: 2 %
HCT: 37 % (ref 36.0–46.0)
Hemoglobin: 12.2 g/dL (ref 12.0–15.0)
Lymphocytes Relative: 23 %
Lymphs Abs: 2.5 10*3/uL (ref 0.7–4.0)
MCH: 26.6 pg (ref 26.0–34.0)
MCHC: 33 g/dL (ref 30.0–36.0)
MCV: 80.8 fL (ref 78.0–100.0)
Monocytes Absolute: 0.7 10*3/uL (ref 0.1–1.0)
Monocytes Relative: 6 %
Neutro Abs: 7.2 10*3/uL (ref 1.7–7.7)
Neutrophils Relative %: 69 %
Platelets: 258 10*3/uL (ref 150–400)
RBC: 4.58 MIL/uL (ref 3.87–5.11)
RDW: 15.7 % — ABNORMAL HIGH (ref 11.5–15.5)
WBC: 10.5 10*3/uL (ref 4.0–10.5)

## 2016-08-04 LAB — BASIC METABOLIC PANEL
Anion gap: 10 (ref 5–15)
BUN: 11 mg/dL (ref 6–20)
CO2: 24 mmol/L (ref 22–32)
Calcium: 8.9 mg/dL (ref 8.9–10.3)
Chloride: 105 mmol/L (ref 101–111)
Creatinine, Ser: 0.82 mg/dL (ref 0.44–1.00)
GFR calc Af Amer: >60 mL/min (ref >60)
GFR calc non Af Amer: >60 mL/min (ref >60)
Glucose, Bld: 112 mg/dL — ABNORMAL HIGH (ref 65–99)
Potassium: 3.7 mmol/L (ref 3.5–5.1)
Sodium: 139 mmol/L (ref 135–145)

## 2016-08-04 MED ORDER — HYDROMORPHONE HCL 1 MG/ML IJ SOLN
1.0000 mg | Freq: Once | INTRAMUSCULAR | Status: AC
  Administered 2016-08-04: 1 mg via INTRAVENOUS
  Filled 2016-08-04: qty 1

## 2016-08-04 MED ORDER — PROCHLORPERAZINE EDISYLATE 5 MG/ML IJ SOLN
10.0000 mg | Freq: Once | INTRAMUSCULAR | Status: AC
  Administered 2016-08-04: 10 mg via INTRAVENOUS
  Filled 2016-08-04: qty 2

## 2016-08-04 MED ORDER — CLINDAMYCIN PHOSPHATE 600 MG/50ML IV SOLN
600.0000 mg | Freq: Once | INTRAVENOUS | Status: AC
  Administered 2016-08-04: 600 mg via INTRAVENOUS
  Filled 2016-08-04: qty 50

## 2016-08-04 MED ORDER — IBUPROFEN 600 MG PO TABS: 600.0000 mg | ORAL_TABLET | Freq: Four times a day (QID) | ORAL | 0 refills | Status: DC

## 2016-08-04 MED ORDER — DOXYCYCLINE HYCLATE 100 MG PO CAPS: 100.0000 mg | ORAL_CAPSULE | Freq: Two times a day (BID) | ORAL | 0 refills | Status: DC

## 2016-08-04 MED ORDER — HYDROCODONE-ACETAMINOPHEN 7.5-325 MG PO TABS: 1.0000 | ORAL_TABLET | ORAL | 0 refills | Status: DC | PRN

## 2016-08-04 NOTE — ED Triage Notes (Signed)
Pt reports 2 boils on buttocks.  States she has been treated several times in the past few weeks for same.

## 2016-08-04 NOTE — ED Provider Notes (Signed)
[facility] Provider Note   CSN: [id number] Arrival date & time: 08/04/16  0827     History   Chief Complaint Chief Complaint  Patient presents with  . Abscess    HPI Gwendolyn Edwards is a 41 y.o. female.  Patient is a 40 year old female who presents to the emergency department with a complaint of abscess.  The patient states that approximately 2-3 weeks ago she had an abscess on the buttocks that ruptured on its own. This was then followed by approximately 8-10 very small abscess areas that also began to drain a pus like material. They later scabbed, but remains sore. 2 days ago the patient began to notice an abscess on the left and a small abscess on the right. The abscess on the left has gotten larger over the last 48 hours. It is extremely tender to palpation to sitting and at times to walking. Patient states she feels as though the pain is deep in her buttocks and at times near her anal area. She has not had fever or chills, but states that she has sweats, particularly at night. She is not sure of the diagnosis of any methicillin-resistant staph. She presents to the emergency department at this time for assistance with this issue.      Past Medical History:  Diagnosis Date  . Anxiety     There are no active problems to display for this patient.   Past Surgical History:  Procedure Laterality Date  . MOUTH SURGERY      OB History    No data available       Home Medications    Prior to Admission medications   Medication Sig Start Date End Date Taking? Authorizing Provider  cephALEXin (KEFLEX) 500 MG capsule Take 1 capsule (500 mg total) by mouth 2 (two) times daily. 02/29/16   Burgess Amor, PA-C  furosemide (LASIX) 20 MG tablet Take 20 mg by mouth daily as needed for fluid.  12/20/14   Historical Provider, MD  MAGNESIUM PO Take 1 tablet by mouth daily.    Historical Provider, MD  naproxen (NAPROSYN) 500 MG tablet Take 1 tablet (500 mg total) by mouth 2  (two) times daily. 02/29/16   Burgess Amor, PA-C  vitamin C (ASCORBIC ACID) 500 MG tablet Take 500 mg by mouth daily.    Historical Provider, MD  vitamin E 400 UNIT capsule Take 400 Units by mouth daily.    Historical Provider, MD    Family History Family History  Problem Relation Age of Onset  . Kidney disease Mother   . Hepatitis C Mother   . Hypertension Mother   . Cancer Father     Social History Social History  Substance Use Topics  . Smoking status: Never Smoker  . Smokeless tobacco: Never Used  . Alcohol use No     Allergies   Patient has no known allergies.   Review of Systems Review of Systems  Constitutional: Negative for activity change.       All ROS Neg except as noted in HPI  HENT: Negative for nosebleeds.   Eyes: Negative for photophobia and discharge.  Respiratory: Negative for cough, shortness of breath and wheezing.   Cardiovascular: Negative for chest pain and palpitations.  Gastrointestinal: Negative for abdominal pain and blood in stool.  Genitourinary: Negative for dysuria, frequency and hematuria.  Musculoskeletal: Negative for arthralgias, back pain and neck pain.  Skin: Positive for wound.       abscess  Neurological: Negative for  dizziness, seizures and speech difficulty.  Psychiatric/Behavioral: Negative for confusion and hallucinations.     Physical Exam Updated Vital Signs BP (!) 153/103 (BP Location: Right Arm)   Pulse 102   Temp 98.7 F (37.1 C) (Oral)   Resp 18   Ht 5\' 3"  (1.6 m)   Wt 117.9 kg   LMP 07/08/2016   SpO2 98%   BMI 46.06 kg/m   Physical Exam  Constitutional: She is oriented to person, place, and time. She appears well-developed and well-nourished.  Non-toxic appearance.  HENT:  Head: Normocephalic.  Right Ear: Tympanic membrane and external ear normal.  Left Ear: Tympanic membrane and external ear normal.  Eyes: EOM and lids are normal. Pupils are equal, round, and reactive to light.  Neck: Normal range of  motion. Neck supple. Carotid bruit is not present.  Cardiovascular: Regular rhythm, normal heart sounds, intact distal pulses and normal pulses.  Tachycardia present.   Pulmonary/Chest: Breath sounds normal. No respiratory distress.  Abdominal: Soft. Bowel sounds are normal. There is no tenderness. There is no guarding.  Genitourinary:  Genitourinary Comments: Chaperone present during the examination. The patient has a large red raised tender abscess at the mid left buttocks cheek. There is tenderness at the cleft of the buttocks cheek extending toward the anal area. There is no visible abscess in this area. There is no drainage.  There is a small red raised tender area of the right buttocks cheek. There are multiple scabbed tender areas of the left and right buttocks cheek area.  Musculoskeletal: Normal range of motion.  Lymphadenopathy:       Head (right side): No submandibular adenopathy present.       Head (left side): No submandibular adenopathy present.    She has no cervical adenopathy.  Neurological: She is alert and oriented to person, place, and time. She has normal strength. No cranial nerve deficit or sensory deficit.  Skin: Skin is warm and dry.  Psychiatric: She has a normal mood and affect. Her speech is normal.  Nursing note and vitals reviewed.    ED Treatments / Results  Labs (all labs ordered are listed, but only abnormal results are displayed) Labs Reviewed  CBC WITH DIFFERENTIAL/PLATELET  BASIC METABOLIC PANEL    EKG  EKG Interpretation None       Radiology No results found.  Procedures Procedures (including critical care time)  Medications Ordered in ED Medications  HYDROmorphone (DILAUDID) injection 1 mg (not administered)  prochlorperazine (COMPAZINE) injection 10 mg (not administered)     Initial Impression / Assessment and Plan / ED Course  I have reviewed the triage vital signs and the nursing notes.  Pertinent labs & imaging results that  were available during my care of the patient were reviewed by me and considered in my medical decision making (see chart for details).     *I have reviewed nursing notes, vital signs, and all appropriate lab and imaging results for this patient.**  Final Clinical Impressions(s) / ED Diagnoses  MDM Vital signs within normal limits. Patient is in no distress. The complete blood count is well within normal limits. The basic metabolic panel is also well within normal limits. Ultrasound of the pelvis shows a soft tissue edema over the left buttocks area there is a subcutaneous track noted, but no clear-cut fluid collection appreciated.  Patient treated in the emergency department with intravenous clindamycin, and pain medication. The patient will be treated with doxycycline and Norco for pain. The patient will use  warm tub soaks daily. She will be rechecked in for 5 days by her primary physician, or return to the emergency department. I've instructed her to return sooner if any signs of advancing infection.    Final diagnoses:  Cellulitis of skin    New Prescriptions New Prescriptions   DOXYCYCLINE (VIBRAMYCIN) 100 MG CAPSULE    Take 1 capsule (100 mg total) by mouth 2 (two) times daily.   HYDROCODONE-ACETAMINOPHEN (NORCO) 7.5-325 MG TABLET    Take 1 tablet by mouth every 4 (four) hours as needed.   IBUPROFEN (ADVIL,MOTRIN) 600 MG TABLET    Take 1 tablet (600 mg total) by mouth 4 (four) times daily.     Ivery QualeHobson Baelyn Doring, PA-C 08/04/16 1115    Blane OharaJoshua Zavitz, MD 08/05/16 [id number]

## 2016-08-04 NOTE — ED Notes (Signed)
ED Provider at bedside. 

## 2016-08-04 NOTE — Discharge Instructions (Signed)
The ultrasound of your abscess area reveals cellulitis. No significant areas of abscess at this time. Please obtain a soft pillow, or a doughnut pillow to use when sitting or relaxing. Please use warm tub soaks with Epsom salt daily for about 15-20 minutes until the situation resolves. Use doxycycline 2 times daily with food. Use ibuprofen with breakfast, lunch, dinner, and at bedtime. Use Norco for more severe pain. Norco may cause drowsiness, and/or lightheadedness. Please do not drive a vehicle, drink alcohol, operate machinery, or participate in activities requiring concentration when taking this particular medication. Please see Dr.Vyas, a member of his team, or return to the emergency department for evaluation of the cellulitis area in about 4 or 5 days. Please return sooner if any high fever, repeated nausea vomiting, chills, or signs of advancing infection.

## 2017-06-10 ENCOUNTER — Emergency Department (HOSPITAL_COMMUNITY)
Admission: EM | Admit: 2017-06-10 | Discharge: 2017-06-10 | Disposition: A | Discharge status: Home / Self Care | Payer: BLUE CROSS/BLUE SHIELD | Attending: Emergency Medicine | Admitting: Emergency Medicine

## 2017-06-10 ENCOUNTER — Other Ambulatory Visit: Payer: Self-pay

## 2017-06-10 ENCOUNTER — Encounter (HOSPITAL_COMMUNITY): Payer: Self-pay | Admitting: Emergency Medicine

## 2017-06-10 DIAGNOSIS — Y999 Unspecified external cause status: Secondary | ICD-10-CM | POA: Diagnosis not present

## 2017-06-10 DIAGNOSIS — Y92410 Unspecified street and highway as the place of occurrence of the external cause: Secondary | ICD-10-CM | POA: Insufficient documentation

## 2017-06-10 DIAGNOSIS — S46811A Strain of other muscles, fascia and tendons at shoulder and upper arm level, right arm, initial encounter: Secondary | ICD-10-CM

## 2017-06-10 DIAGNOSIS — Y9389 Activity, other specified: Secondary | ICD-10-CM | POA: Diagnosis not present

## 2017-06-10 DIAGNOSIS — M545 Low back pain: Secondary | ICD-10-CM | POA: Insufficient documentation

## 2017-06-10 DIAGNOSIS — S4991XA Unspecified injury of right shoulder and upper arm, initial encounter: Secondary | ICD-10-CM | POA: Diagnosis present

## 2017-06-10 MED ORDER — IBUPROFEN 600 MG PO TABS: 600.0000 mg | ORAL_TABLET | Freq: Four times a day (QID) | ORAL | 0 refills | Status: DC

## 2017-06-10 MED ORDER — CYCLOBENZAPRINE HCL 10 MG PO TABS: 10.0000 mg | ORAL_TABLET | Freq: Three times a day (TID) | ORAL | 0 refills | Status: DC
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
## 2017-06-10 NOTE — Discharge Instructions (Signed)
Please use a heating pad to your shoulder and to your lower back.  Please use 600 mg of ibuprofen with breakfast, lunch, dinner, and at bedtime.  Please use Flexeril 3 times daily for spasm pain.This medication may cause drowsiness. Please do not drink, drive, or participate in activity that requires concentration while taking this medication.  Please return to the emergency department if any emergent changes, problems, or concerns.
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
## 2017-06-23 DIAGNOSIS — L0211 Cutaneous abscess of neck: Secondary | ICD-10-CM

## 2017-06-23 HISTORY — DX: Cutaneous abscess of neck: L02.11

## 2018-02-08 DIAGNOSIS — L0211 Cutaneous abscess of neck: Secondary | ICD-10-CM | POA: Insufficient documentation

## 2018-03-05 ENCOUNTER — Emergency Department (HOSPITAL_COMMUNITY)
Admission: EM | Admit: 2018-03-05 | Discharge: 2018-03-06 | Disposition: A | Discharge status: Home / Self Care | Payer: BLUE CROSS/BLUE SHIELD | Attending: Emergency Medicine | Admitting: Emergency Medicine

## 2018-03-05 ENCOUNTER — Other Ambulatory Visit: Payer: Self-pay

## 2018-03-05 ENCOUNTER — Encounter (HOSPITAL_COMMUNITY): Payer: Self-pay

## 2018-03-05 ENCOUNTER — Emergency Department (HOSPITAL_COMMUNITY): Payer: BLUE CROSS/BLUE SHIELD

## 2018-03-05 DIAGNOSIS — J40 Bronchitis, not specified as acute or chronic: Secondary | ICD-10-CM

## 2018-03-05 DIAGNOSIS — Z79899 Other long term (current) drug therapy: Secondary | ICD-10-CM | POA: Insufficient documentation

## 2018-03-05 DIAGNOSIS — R0602 Shortness of breath: Secondary | ICD-10-CM | POA: Diagnosis present

## 2018-03-05 LAB — CBC WITH DIFFERENTIAL/PLATELET
Basophils Absolute: 0 10*3/uL (ref 0.0–0.1)
Basophils Relative: 0 %
Eosinophils Absolute: 0.3 10*3/uL (ref 0.0–0.7)
Eosinophils Relative: 4 %
HCT: 40.1 % (ref 36.0–46.0)
Hemoglobin: 13 g/dL (ref 12.0–15.0)
Lymphocytes Relative: 39 %
Lymphs Abs: 2.6 10*3/uL (ref 0.7–4.0)
MCH: 26.3 pg (ref 26.0–34.0)
MCHC: 32.4 g/dL (ref 30.0–36.0)
MCV: 81 fL (ref 78.0–100.0)
Monocytes Absolute: 0.7 10*3/uL (ref 0.1–1.0)
Monocytes Relative: 10 %
Neutro Abs: 3.1 10*3/uL (ref 1.7–7.7)
Neutrophils Relative %: 47 %
Platelets: 262 10*3/uL (ref 150–400)
RBC: 4.95 MIL/uL (ref 3.87–5.11)
RDW: 15.2 % (ref 11.5–15.5)
WBC: 6.7 10*3/uL (ref 4.0–10.5)

## 2018-03-05 LAB — D-DIMER, QUANTITATIVE (NOT AT ARMC): D-Dimer, Quant: 0.56 ug/mL-FEU — ABNORMAL HIGH (ref 0.00–0.50)

## 2018-03-05 MED ORDER — IPRATROPIUM-ALBUTEROL 0.5-2.5 (3) MG/3ML IN SOLN
RESPIRATORY_TRACT | Status: AC
  Administered 2018-03-06: 3 mL
  Filled 2018-03-05: qty 3

## 2018-03-05 MED ORDER — IPRATROPIUM-ALBUTEROL 0.5-2.5 (3) MG/3ML IN SOLN
3.0000 mL | Freq: Once | RESPIRATORY_TRACT | Status: AC
  Administered 2018-03-06: 3 mL via RESPIRATORY_TRACT
  Filled 2018-03-05: qty 3

## 2018-03-05 NOTE — ED Triage Notes (Signed)
Pt had procedure 3 weeks ago, states she completed clindamycin and vancomycin recently.  Pt states she started feeling sob with cough and congestion 3 days ago.  Pt also reports pain in her back from top to bottom.

## 2018-03-05 NOTE — ED Provider Notes (Signed)
[facility] EMERGENCY DEPARTMENT Provider Note   CSN: [id number] Arrival date & time: 03/05/18  2302     History   Chief Complaint Chief Complaint  Patient presents with  . Shortness of Breath  . Cough    HPI DESTENI PISCOPO is a 42 y.o. female.  Patient presents with a 3-day history of cough, congestion, shortness of breath and back pain.  Symptoms started as chills with 3 days ago and back pain from her top of her shoulder blades "all the way down".  She is had a cough but not able to bring up anything.  Has had some nasal congestion as well as fever to 103 for the past 3 days.  She denies any chest pain, leg pain or leg swelling.  No abdominal pain, nausea or vomiting.  No diarrhea.  No pain with urination or blood in the urine.  Denies any history of asthma or COPD.  She does not smoke.  Reports she has pain with deep breathing and has been coughing but nothing is coming out.  No sick contacts.  The pain in her back is worse with coughing and movement. Patient was recently on clindamycin and vancomycin for neck abscess which was drained.  She completed antibiotics about 1 week ago.  The history is provided by the patient.  Shortness of Breath  Associated symptoms include a fever, rhinorrhea, sore throat and cough. Pertinent negatives include no headaches, no neck pain, no chest pain, no abdominal pain and no rash.  Cough  Associated symptoms include chills, rhinorrhea, sore throat, myalgias and shortness of breath. Pertinent negatives include no chest pain and no headaches.    Past Medical History:  Diagnosis Date  . Anxiety     There are no active problems to display for this patient.   Past Surgical History:  Procedure Laterality Date  . MOUTH SURGERY       OB History   None      Home Medications    Prior to Admission medications   Medication Sig Start Date End Date Taking? Authorizing Provider  cephALEXin (KEFLEX) 500 MG capsule Take 1 capsule (500  mg total) by mouth 2 (two) times daily. Patient not taking: Reported on 08/04/2016 02/29/16   Burgess Amor, PA-C  cyclobenzaprine (FLEXERIL) 10 MG tablet Take 1 tablet (10 mg total) by mouth 3 (three) times daily. 06/10/17   Ivery Quale, PA-C  doxycycline (VIBRAMYCIN) 100 MG capsule Take 1 capsule (100 mg total) by mouth 2 (two) times daily. 08/04/16   Ivery Quale, PA-C  furosemide (LASIX) 20 MG tablet Take 20 mg by mouth daily as needed for fluid.  12/20/14   [provider]  HYDROcodone-acetaminophen (NORCO) 7.5-325 MG tablet Take 1 tablet by mouth every 4 (four) hours as needed. 08/04/16   Ivery Quale, PA-C  ibuprofen (ADVIL,MOTRIN) 600 MG tablet Take 1 tablet (600 mg total) by mouth 4 (four) times daily. 06/10/17   Ivery Quale, PA-C  Multiple Vitamin (MULTIVITAMIN WITH MINERALS) TABS tablet Take 1 tablet by mouth daily.    [provider]  naproxen (NAPROSYN) 500 MG tablet Take 1 tablet (500 mg total) by mouth 2 (two) times daily. Patient not taking: Reported on 08/04/2016 02/29/16   Burgess Amor, PA-C    Family History Family History  Problem Relation Age of Onset  . Kidney disease Mother   . Hepatitis C Mother   . Hypertension Mother   . Cancer Father     Social History Social History  Tobacco Use  . Smoking status: Never Smoker  . Smokeless tobacco: Never Used  Substance Use Topics  . Alcohol use: No  . Drug use: No     Allergies   Augmentin [amoxicillin-pot clavulanate]   Review of Systems Review of Systems  Constitutional: Positive for activity change, appetite change, chills and fever.  HENT: Positive for congestion, rhinorrhea and sore throat.   Eyes: Negative for visual disturbance.  Respiratory: Positive for cough and shortness of breath.   Cardiovascular: Negative for chest pain.  Gastrointestinal: Negative for abdominal pain and nausea.  Genitourinary: Negative for dysuria, hematuria, vaginal bleeding and vaginal discharge.    Musculoskeletal: Positive for arthralgias, back pain and myalgias. Negative for neck pain and neck stiffness.  Skin: Negative for rash and wound.  Neurological: Negative for dizziness, weakness and headaches.   all other systems are negative except as noted in the HPI and PMH.     Physical Exam Updated Vital Signs BP (!) 139/95 (BP Location: Left Arm)   Pulse 90   Temp 98.1 F (36.7 C) (Oral)   Resp (!) 22   Ht 5\' 3"  (1.6 m)   Wt 127 kg   LMP 02/21/2018 (Approximate)   SpO2 98%   BMI 49.60 kg/m   Physical Exam  Constitutional: She is oriented to person, place, and time. She appears well-developed and well-nourished. No distress.  Speaking full sentences, no distress  HENT:  Head: Normocephalic and atraumatic.  Mouth/Throat: Oropharynx is clear and moist. No oropharyngeal exudate.  Eyes: Pupils are equal, round, and reactive to light. Conjunctivae and EOM are normal.  Neck: Normal range of motion. Neck supple.  No meningismus.  Cardiovascular: Normal rate, regular rhythm, normal heart sounds and intact distal pulses.  No murmur heard. Equal radial pulses and grip strength  Pulmonary/Chest: Effort normal and breath sounds normal. No respiratory distress.  Rhonchi bilaterally, no wheezing Good air exchange  Abdominal: Soft. There is no tenderness. There is no rebound and no guarding.  Musculoskeletal: Normal range of motion. She exhibits tenderness. She exhibits no edema.  Paraspinal thoracic and lumbar tenderness bilaterally  Neurological: She is alert and oriented to person, place, and time. No cranial nerve deficit. She exhibits normal muscle tone. Coordination normal.  No ataxia on finger to nose bilaterally. No pronator drift. 5/5 strength throughout. CN 2-12 intact.Equal grip strength. Sensation intact.   Skin: Skin is warm.  Psychiatric: She has a normal mood and affect. Her behavior is normal.  Nursing note and vitals reviewed.    ED Treatments / Results   Labs (all labs ordered are listed, but only abnormal results are displayed) Labs Reviewed  BASIC METABOLIC PANEL - Abnormal; Notable for the following components:      Result Value   Glucose, Bld 115 (*)    Calcium 8.3 (*)    All other components within normal limits  D-DIMER, QUANTITATIVE (NOT AT [facility]) - Abnormal; Notable for the following components:   D-Dimer, Quant 0.56 (*)    All other components within normal limits  URINALYSIS, ROUTINE W REFLEX MICROSCOPIC - Abnormal; Notable for the following components:   APPearance HAZY (*)    Leukocytes, UA SMALL (*)    Bacteria, UA RARE (*)    All other components within normal limits  CBC WITH DIFFERENTIAL/PLATELET  TROPONIN I  BRAIN NATRIURETIC PEPTIDE  PREGNANCY, URINE    EKG EKG Interpretation  Date/Time:  Friday March 05 2018 23:34:59 EDT Ventricular Rate:  83 PR Interval:    QRS  Duration: 87 QT Interval:  384 QTC Calculation: 452 R Axis:   57 Text Interpretation:  Sinus rhythm No significant change was found Confirmed by Glynn Octave [phone]) on 03/05/2018 11:46:25 PM   Radiology Dg Chest 2 View  Result Date: 03/05/2018 CLINICAL DATA:  Cough and fever. EXAM: CHEST - 2 VIEW COMPARISON:  12/27/2014 FINDINGS: The cardiomediastinal contours are normal. The lungs are clear. Pulmonary vasculature is normal. No consolidation, pleural effusion, or pneumothorax. No acute osseous abnormalities are seen. Mild degenerative change in the spine. IMPRESSION: No acute pulmonary process. Electronically Signed   By: Narda Rutherford M.D.   On: 03/05/2018 23:59   Ct Angio Chest/abd/pel For Dissection W And/or Wo Contrast  Result Date: 03/06/2018 CLINICAL DATA:  Acute onset of chest pain. EXAM: CT ANGIOGRAPHY CHEST, ABDOMEN AND PELVIS TECHNIQUE: Multidetector CT imaging through the chest, abdomen and pelvis was performed using the standard protocol during bolus administration of intravenous contrast. Multiplanar reconstructed images  and MIPs were obtained and reviewed to evaluate the vascular anatomy. CONTRAST:  ISOVUE-370 IOPAMIDOL (ISOVUE-370) INJECTION 76% COMPARISON:  Radiographs yesterday. FINDINGS: CTA CHEST FINDINGS Cardiovascular: Normal caliber thoracic aorta without dissection, aortic hematoma, aneurysm, acute aortic syndrome or significant atherosclerosis. No filling defects in the central most pulmonary arteries to suggest pulmonary embolus. Heart is normal in size. No pericardial effusion. Mediastinum/Nodes: No enlarged mediastinal or hilar lymph nodes. No thyroid nodule. Esophagus is nondistended. Lungs/Pleura: No confluent consolidation, pulmonary edema or pleural fluid. Subsegmental linear atelectasis in the left lower lobe. Musculoskeletal: Diffuse endplate spurring throughout thoracic spine. There are no acute or suspicious osseous abnormalities. Review of the MIP images confirms the above findings. CTA ABDOMEN AND PELVIS FINDINGS VASCULAR Aorta: Normal caliber aorta without aneurysm, dissection, vasculitis or significant stenosis. Celiac: Patent without evidence of aneurysm, dissection, vasculitis or significant stenosis. SMA: Patent without evidence of aneurysm, dissection, vasculitis or significant stenosis. Renals: Both renal arteries are patent without evidence of aneurysm, dissection, vasculitis, fibromuscular dysplasia or significant stenosis. IMA: Patent without evidence of aneurysm, dissection, vasculitis or significant stenosis. Inflow: Patent without evidence of aneurysm, dissection, vasculitis or significant stenosis. Veins: No obvious venous abnormality within the limitations of this arterial phase study. Review of the MIP images confirms the above findings. NON-VASCULAR Hepatobiliary: Liver is enlarged spanning 21 cm cranial caudal. Borderline hepatic steatosis. No focal lesion on arterial phase imaging. Gallbladder physiologically distended, no calcified stone. No biliary dilatation. Pancreas: No ductal  dilatation or inflammation. Spleen: Normal in size and arterial phase enhancement. Adrenals/Urinary Tract: No adrenal nodule. No hydronephrosis or perinephric edema. Homogeneous renal enhancement. Urinary bladder is nondistended and not well evaluated. Stomach/Bowel: Stomach is within normal limits. Appendix appears normal. No evidence of bowel wall thickening, distention, or inflammatory changes. Lymphatic: No abdominopelvic adenopathy. Reproductive: Uterus and bilateral adnexa are unremarkable. Other: No free fluid or free air. Musculoskeletal: Hemi transitional lumbosacral anatomy. Mild facet hypertrophy lower lumbar spine. Review of the MIP images confirms the above findings. IMPRESSION: 1. Normal thoracoabdominal aorta without acute aortic abnormality or aneurysm. 2. No acute findings in the chest, abdomen, or pelvis. 3. Mild hepatomegaly and borderline hepatic steatosis. Electronically Signed   By: Narda Rutherford M.D.   On: 03/06/2018 01:06    Procedures Procedures (including critical care time)  Medications Ordered in ED Medications  ipratropium-albuterol (DUONEB) 0.5-2.5 (3) MG/3ML nebulizer solution 3 mL (has no administration in time range)     Initial Impression / Assessment and Plan / ED Course  I have reviewed the triage vital signs  and the nursing notes.  Pertinent labs & imaging results that were available during my care of the patient were reviewed by me and considered in my medical decision making (see chart for details).    Patient with 3 days of shortness of breath, cough, fever, and back pain.  No chest pain.  Will check EKG, chest x-ray, give breathing treatment.  Patient had echocardiogram in July 2016 which was normal. EKG nsr, no Brugada or prolonged QT.  Chest x-ray negative for infiltrate.  Labs are reassuring with negative troponin.  D-dimer minimally elevated at 0.56.  With pleuritic shortness of breath and back pain will obtain CT to rule out pulmonary  embolism.  CT negative for pulmonary embolism or aortic dissection.  Patient ambulatory without desaturation.  No chest pain.  Troponin is negative.  Low suspicion for ACS.  Will treat supportively for likely bronchitis with antibiotics and bronchodilators.  Antipyretics as needed for aches and fever, p.o. hydration, PCP follow-up return precautions discussed. Final Clinical Impressions(s) / ED Diagnoses   Final diagnoses:  Bronchitis    ED Discharge Orders    None       Jalea Bronaugh, Jeannett Senior, MD 03/06/18 0345

## 2018-03-05 NOTE — ED Notes (Signed)
Patient transported to X-ray 

## 2018-03-06 ENCOUNTER — Emergency Department (HOSPITAL_COMMUNITY): Payer: BLUE CROSS/BLUE SHIELD

## 2018-03-06 LAB — URINALYSIS, ROUTINE W REFLEX MICROSCOPIC
Bilirubin Urine: NEGATIVE
Glucose, UA: NEGATIVE mg/dL
Hgb urine dipstick: NEGATIVE
Ketones, ur: NEGATIVE mg/dL
Nitrite: NEGATIVE
Protein, ur: NEGATIVE mg/dL
Specific Gravity, Urine: 1.019 (ref 1.005–1.030)
pH: 5 (ref 5.0–8.0)

## 2018-03-06 LAB — BASIC METABOLIC PANEL
Anion gap: 8 (ref 5–15)
BUN: 9 mg/dL (ref 6–20)
CO2: 23 mmol/L (ref 22–32)
Calcium: 8.3 mg/dL — ABNORMAL LOW (ref 8.9–10.3)
Chloride: 106 mmol/L (ref 98–111)
Creatinine, Ser: 0.75 mg/dL (ref 0.44–1.00)
GFR calc Af Amer: >60 mL/min (ref >60)
GFR calc non Af Amer: >60 mL/min (ref >60)
Glucose, Bld: 115 mg/dL — ABNORMAL HIGH (ref 70–99)
Potassium: 3.5 mmol/L (ref 3.5–5.1)
Sodium: 137 mmol/L (ref 135–145)

## 2018-03-06 LAB — TROPONIN I: Troponin I: <0.03 ng/mL (ref <0.03)

## 2018-03-06 LAB — BRAIN NATRIURETIC PEPTIDE: B Natriuretic Peptide: 19 pg/mL (ref 0.0–100.0)

## 2018-03-06 LAB — PREGNANCY, URINE: Preg Test, Ur: NEGATIVE

## 2018-03-06 MED ORDER — ALBUTEROL SULFATE HFA 108 (90 BASE) MCG/ACT IN AERS: 2.0000 | INHALATION_SPRAY | Freq: Four times a day (QID) | RESPIRATORY_TRACT | 0 refills | Status: DC | PRN

## 2018-03-06 MED ORDER — AZITHROMYCIN 250 MG PO TABS: 250.0000 mg | ORAL_TABLET | Freq: Every day | ORAL | 0 refills | Status: DC

## 2018-03-06 MED ORDER — IOPAMIDOL (ISOVUE-370) INJECTION 76%
100.0000 mL | Freq: Once | INTRAVENOUS | Status: AC | PRN
  Administered 2018-03-06: 100 mL via INTRAVENOUS

## 2018-03-06 NOTE — Discharge Instructions (Addendum)
Take the antibiotics as prescribed and follow-up with your doctor.  Use Tylenol or ibuprofen as needed for aches and fever.  Return to the ED if you develop chest pain, shortness of breath or any other concerns.

## 2018-03-06 NOTE — ED Notes (Signed)
Pt ambulatory to waiting room. Pt verbalized understanding of discharge instructions.   

## 2018-03-06 NOTE — ED Notes (Signed)
Pts O2 remained 98% and higher after 1 lap around the nurses station.

## 2018-08-31 ENCOUNTER — Other Ambulatory Visit: Payer: BLUE CROSS/BLUE SHIELD | Admitting: Adult Health

## 2018-09-01 ENCOUNTER — Other Ambulatory Visit: Payer: Self-pay | Admitting: Adult Health

## 2018-09-30 ENCOUNTER — Other Ambulatory Visit: Payer: Self-pay | Admitting: Adult Health

## 2018-12-01 ENCOUNTER — Other Ambulatory Visit (HOSPITAL_COMMUNITY)
Admission: RE | Admit: 2018-12-01 | Discharge: 2018-12-01 | Disposition: A | Discharge status: Home / Self Care | Payer: BC Managed Care – PPO | Source: Ambulatory Visit | Attending: Adult Health | Admitting: Adult Health

## 2018-12-01 ENCOUNTER — Ambulatory Visit (INDEPENDENT_AMBULATORY_CARE_PROVIDER_SITE_OTHER): Payer: BC Managed Care – PPO | Admitting: Adult Health

## 2018-12-01 ENCOUNTER — Encounter: Payer: Self-pay | Admitting: Adult Health

## 2018-12-01 ENCOUNTER — Other Ambulatory Visit: Payer: Self-pay

## 2018-12-01 VITALS — BP 156/94 | HR 90 | Ht 63.0 in | Wt 285.0 lb

## 2018-12-01 DIAGNOSIS — R030 Elevated blood-pressure reading, without diagnosis of hypertension: Secondary | ICD-10-CM

## 2018-12-01 DIAGNOSIS — Z1211 Encounter for screening for malignant neoplasm of colon: Secondary | ICD-10-CM | POA: Diagnosis not present

## 2018-12-01 DIAGNOSIS — Z1212 Encounter for screening for malignant neoplasm of rectum: Secondary | ICD-10-CM | POA: Diagnosis not present

## 2018-12-01 DIAGNOSIS — Z01419 Encounter for gynecological examination (general) (routine) without abnormal findings: Secondary | ICD-10-CM | POA: Diagnosis not present

## 2018-12-01 DIAGNOSIS — I119 Hypertensive heart disease without heart failure: Secondary | ICD-10-CM | POA: Insufficient documentation

## 2018-12-01 HISTORY — DX: Hypertensive heart disease without heart failure: I11.9

## 2018-12-01 LAB — HEMOCCULT GUIAC POC 1CARD (OFFICE): Fecal Occult Blood, POC: NEGATIVE

## 2018-12-01 NOTE — Progress Notes (Signed)
Patient ID: Gwendolyn Edwards, female   DOB: [birth year], 43 y.o.   MRN: [id number] History of Present Illness: Gwendolyn Edwards is a 43 year old black female, married(he is in jail), [id number], in for a well woman gyn exam and pap. 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  PCP is Dr Woody Seller.   Current Medications, Allergies, Past Medical History, Past Surgical History, Family History and Social History were reviewed in [employer] record.     Review of Systems: Patient denies any headaches, hearing loss, fatigue, blurred vision, shortness of breath, chest pain, abdominal pain, problems with bowel movements, urination, or intercourse(not having sex). No joint pain or mood swings. Periods regular, has some PMS.   Physical Exam:BP (!) 156/94 (BP Location: Right Arm, Patient Position: Sitting, Cuff Size: Normal)   Pulse 90   Ht 5\' 3"  (1.6 m)   Wt 285 lb (129.3 kg)   LMP 11/12/2018 (Exact Date)   BMI 50.49 kg/m  General:  Well developed, well nourished, no acute distress Skin:  Warm and dry Neck:  Midline trachea, normal thyroid, good ROM, no lymphadenopathy Lungs; Clear to auscultation bilaterally Breast:  No dominant palpable mass, retraction, or nipple discharge Cardiovascular: Regular rate and rhythm Abdomen:  Soft, non tender, no hepatosplenomegaly Pelvic:  External genitalia is normal in appearance, no lesions.  The vagina is normal in appearance. Urethra has no lesions or masses. The cervix is bulbous.Pap with HPV performed.  Uterus is felt to be normal size, shape, and contour.  No adnexal masses or tenderness noted.Bladder is non tender, no masses felt. Rectal: Good sphincter tone, no polyps, or hemorrhoids felt.  Hemoccult negative. Extremities/musculoskeletal:  No swelling or varicosities noted, no clubbing or cyanosis Psych:  No mood changes, alert and cooperative,seems happy Fall risk is low. PHQ 2 score 0. Examination chaperoned  by Estill Bamberg Rash LPN. She has labs 6/17 with Dr Woody Seller and had mammogram in September at the [facility]. Discussed losing 30 lbs would be of benefit and follow up with Dr Woody Seller regarding BP.   Impression:  1. Encounter for gynecological examination with Papanicolaou smear of cervix   2. Screening for colorectal cancer   3. Elevated BP without diagnosis of hypertension      Plan: Physical in 1 year Pap in 3 if normal Mammogram yearly Labs with PCP  Work on weight and follow up with Dr Woody Seller on BP

## 2018-12-03 LAB — CYTOLOGY - PAP
Diagnosis: NEGATIVE
HPV: NOT DETECTED

## 2019-05-26 ENCOUNTER — Other Ambulatory Visit: Payer: Self-pay

## 2019-05-26 ENCOUNTER — Ambulatory Visit: Payer: BC Managed Care – PPO | Admitting: Cardiology

## 2019-05-26 ENCOUNTER — Encounter: Payer: Self-pay | Admitting: Cardiology

## 2019-05-26 VITALS — BP 137/80 | HR 80 | Temp 97.3°F | Ht 63.0 in | Wt 265.0 lb

## 2019-05-26 DIAGNOSIS — I10 Essential (primary) hypertension: Secondary | ICD-10-CM

## 2019-05-26 DIAGNOSIS — R011 Cardiac murmur, unspecified: Secondary | ICD-10-CM | POA: Diagnosis not present

## 2019-05-26 DIAGNOSIS — R9431 Abnormal electrocardiogram [ECG] [EKG]: Secondary | ICD-10-CM | POA: Diagnosis not present

## 2019-05-26 NOTE — Patient Instructions (Signed)
Medication Instructions:  Your physician recommends that you continue on your current medications as directed. Please refer to the Current Medication list given to you today.  *If you need a refill on your cardiac medications before your next appointment, please call your pharmacy*  Lab Work: None today If you have labs (blood work) drawn today and your tests are completely normal, you will receive your results only by: Marland Kitchen MyChart Message (if you have MyChart) OR . A paper copy in the mail If you have any lab test that is abnormal or we need to change your treatment, we will call you to review the results.  Testing/Procedures: None today  Follow-Up: At [facility], you and your health needs are our priority.  As part of our continuing mission to provide you with exceptional heart care, we have created designated Provider Care Teams.  These Care Teams include your primary Cardiologist (physician) and Advanced Practice Providers (APPs -  Physician Assistants and Nurse Practitioners) who all work together to provide you with the care you need, when you need it.    We will be happy to see you again if needed !        Thank you for choosing [facility] !

## 2019-05-26 NOTE — Progress Notes (Signed)
Cardiology Office Note  Date: 05/26/2019   ID: AMALIE KORAN, DOB 07/22/1975, MRN [id number]  PCP:  Ignatius Specking, MD  Consulting Cardiologist: Jonelle Sidle, MD Electrophysiologist:  None   Chief Complaint  Patient presents with  . Heart Murmur    History of Present Illness: Gwendolyn Edwards is a 43 y.o. female referred for cardiology consultation by Dr. Sherril Croon for evaluation of heart murmur.  I reviewed available records.  She states that she was told that she had a heart murmur at routine physical with PCP, no associated symptoms.  She had an echocardiogram done through [facility] in September.  Report indicates LVEF 60 to 65% with mild LVH, normal RV contraction, normal atrial size, trace tricuspid regurgitation with RVSP 19 mmHg, no other significant valvular abnormalities, and no pericardial effusion.  I discussed the results with her today.  On examination she has a benign sounding systolic murmur.  ECG today shows sinus rhythm with nonspecific ST-T changes, predominantly anterolateral leads, consider repolarization changes versus ischemia.  Changes are new in comparison to prior tracing from September 2019.  We discussed this as well.  She does not report any exertional symptoms, states that she gets about 15,000 steps a day at work, no major functional limitations.  She does indicate that she has had some difficulty with blood pressure control and her medications have been modified.  She is currently on Norvasc and Hyzaar.  She reports no family history of premature CAD.  Past Medical History:  Diagnosis Date  . Acid reflux   . Anxiety   . Essential hypertension     Past Surgical History:  Procedure Laterality Date  . WISDOM TOOTH EXTRACTION      Current Outpatient Medications  Medication Sig Dispense Refill  . amLODipine (NORVASC) 5 MG tablet Take 5 mg by mouth daily.    . famotidine (PEPCID) 10 MG tablet Take 10 mg by mouth 2 (two)  times daily.    Marland Kitchen losartan-hydrochlorothiazide (HYZAAR) 100-12.5 MG tablet Take 1 tablet by mouth daily.    Marland Kitchen venlafaxine (EFFEXOR) 37.5 MG tablet Take 37.5 mg by mouth daily.     No current facility-administered medications for this visit.    Allergies:  Augmentin [amoxicillin-pot clavulanate]   Social History: The patient  reports that she has never smoked. She has never used smokeless tobacco. She reports that she does not drink alcohol or use drugs.   Family History: The patient's family history includes Cancer in her father; Hepatitis C in her mother; Hypertension in her mother; Kidney disease in her mother; Thyroid disease in her sister.   ROS:  Please see the history of present illness. Otherwise, complete review of systems is positive for none.  All other systems are reviewed and negative.   Physical Exam: VS:  BP 137/80   Pulse 80   Temp (!) 97.3 F (36.3 C)   Ht 5\' 3"  (1.6 m)   Wt 265 lb (120.2 kg)   SpO2 98%   BMI 46.94 kg/m , BMI Body mass index is 46.94 kg/m.  Wt Readings from Last 3 Encounters:  05/26/19 265 lb (120.2 kg)  12/01/18 285 lb (129.3 kg)  03/05/18 280 lb (127 kg)    General: Patient appears comfortable at rest. HEENT: Conjunctiva and lids normal, wearing a mask. Neck: Supple, no elevated JVP or carotid bruits, no thyromegaly. Lungs: Clear to auscultation, nonlabored breathing at rest. Cardiac: Regular rate and rhythm, no S3, soft basal systolic murmur,  no pericardial rub. Abdomen: Soft, nontender, bowel sounds present. Extremities: No pitting edema, distal pulses 2+. Skin: Warm and dry. Musculoskeletal: No kyphosis. Neuropsychiatric: Alert and oriented x3, affect grossly appropriate.  ECG:  An ECG dated 03/05/2018 was personally reviewed today and demonstrated:  Normal sinus rhythm.  Recent Labwork:  June 2020: Cholesterol 178, triglycerides 58, HDL 49, LDL 117, BUN 8, creatinine 0.68, potassium 4.1, AST 14, ALT 14, TSH 4.65, hemoglobin 13.3,  platelets 263  Other Studies Reviewed Today:  Chest, abdominal, pelvic CT 03/06/2018: FINDINGS: CTA CHEST FINDINGS  Cardiovascular: Normal caliber thoracic aorta without dissection, aortic hematoma, aneurysm, acute aortic syndrome or significant atherosclerosis. No filling defects in the central most pulmonary arteries to suggest pulmonary embolus. Heart is normal in size. No pericardial effusion.  Mediastinum/Nodes: No enlarged mediastinal or hilar lymph nodes. No thyroid nodule. Esophagus is nondistended.  Lungs/Pleura: No confluent consolidation, pulmonary edema or pleural fluid. Subsegmental linear atelectasis in the left lower lobe.  Musculoskeletal: Diffuse endplate spurring throughout thoracic spine. There are no acute or suspicious osseous abnormalities.  Review of the MIP images confirms the above findings.  CTA ABDOMEN AND PELVIS FINDINGS  VASCULAR  Aorta: Normal caliber aorta without aneurysm, dissection, vasculitis or significant stenosis.  Celiac: Patent without evidence of aneurysm, dissection, vasculitis or significant stenosis.  SMA: Patent without evidence of aneurysm, dissection, vasculitis or significant stenosis.  Renals: Both renal arteries are patent without evidence of aneurysm, dissection, vasculitis, fibromuscular dysplasia or significant stenosis.  IMA: Patent without evidence of aneurysm, dissection, vasculitis or significant stenosis.  Inflow: Patent without evidence of aneurysm, dissection, vasculitis or significant stenosis.  Veins: No obvious venous abnormality within the limitations of this arterial phase study.  Review of the MIP images confirms the above findings.  NON-VASCULAR  Hepatobiliary: Liver is enlarged spanning 21 cm cranial caudal. Borderline hepatic steatosis. No focal lesion on arterial phase imaging. Gallbladder physiologically distended, no calcified stone. No biliary dilatation.  Pancreas: No  ductal dilatation or inflammation.  Spleen: Normal in size and arterial phase enhancement.  Adrenals/Urinary Tract: No adrenal nodule. No hydronephrosis or perinephric edema. Homogeneous renal enhancement. Urinary bladder is nondistended and not well evaluated.  Stomach/Bowel: Stomach is within normal limits. Appendix appears normal. No evidence of bowel wall thickening, distention, or inflammatory changes.  Lymphatic: No abdominopelvic adenopathy.  Reproductive: Uterus and bilateral adnexa are unremarkable.  Other: No free fluid or free air.  Musculoskeletal: Hemi transitional lumbosacral anatomy. Mild facet hypertrophy lower lumbar spine.  Review of the MIP images confirms the above findings.  IMPRESSION: 1. Normal thoracoabdominal aorta without acute aortic abnormality or aneurysm. 2. No acute findings in the chest, abdomen, or pelvis. 3. Mild hepatomegaly and borderline hepatic steatosis.  Assessment and Plan:  1.  Soft systolic murmur, benign on examination and with reassuring echocardiogram done recently demonstrating no substantial valvular disease and normal LVEF.  Incidentally, she did have a CT examination in September 2019 that showed normal thoracoabdominal aorta.  2.  Abnormal ECG.  She does not report any associated exertional symptoms, this could represent repolarization abnormality particularly with history of hypertension and LVH.  She is tolerating Norvasc and Hyzaar.  Would keep follow-up with PCP.  Consider ischemic testing if any exertional symptoms develop.  3.  Essential hypertension, currently on Norvasc and Hyzaar.  Systolic is in the 101B today.  Keep follow-up with PCP.  Medication Adjustments/Labs and Tests Ordered: Current medicines are reviewed at length with the patient today.  Concerns regarding medicines are outlined above.  Tests Ordered: Orders Placed This Encounter  Procedures  . EKG 12-Lead    Medication Changes: No orders  of the defined types were placed in this encounter.   Disposition:  Follow up prn  Signed, Jonelle SidleSamuel G. Draden Cottingham, MD, [facility] HospitalFACC 05/26/2019 3:14 PM    [facility] [address], [place], [place] [address] Phone: [phone]; Fax: [phone]

## 2019-06-05 ENCOUNTER — Emergency Department (HOSPITAL_COMMUNITY): Payer: BC Managed Care – PPO

## 2019-06-05 ENCOUNTER — Inpatient Hospital Stay (HOSPITAL_COMMUNITY)
Admission: EM | Admit: 2019-06-05 | Discharge: 2019-06-08 | DRG: 287 | Disposition: A | Discharge status: Home / Self Care | Payer: BC Managed Care – PPO | Attending: Cardiovascular Disease | Admitting: Cardiovascular Disease

## 2019-06-05 ENCOUNTER — Other Ambulatory Visit: Payer: Self-pay

## 2019-06-05 ENCOUNTER — Observation Stay (HOSPITAL_COMMUNITY): Payer: BC Managed Care – PPO

## 2019-06-05 ENCOUNTER — Encounter (HOSPITAL_COMMUNITY): Payer: Self-pay | Admitting: Emergency Medicine

## 2019-06-05 ENCOUNTER — Inpatient Hospital Stay (HOSPITAL_COMMUNITY)
Admission: EM | Disposition: A | Discharge status: Home / Self Care | Payer: Self-pay | Source: Home / Self Care | Attending: Cardiovascular Disease

## 2019-06-05 DIAGNOSIS — I2109 ST elevation (STEMI) myocardial infarction involving other coronary artery of anterior wall: Secondary | ICD-10-CM | POA: Diagnosis not present

## 2019-06-05 DIAGNOSIS — E669 Obesity, unspecified: Secondary | ICD-10-CM | POA: Diagnosis not present

## 2019-06-05 DIAGNOSIS — Z79899 Other long term (current) drug therapy: Secondary | ICD-10-CM

## 2019-06-05 DIAGNOSIS — K219 Gastro-esophageal reflux disease without esophagitis: Secondary | ICD-10-CM | POA: Diagnosis present

## 2019-06-05 DIAGNOSIS — E876 Hypokalemia: Secondary | ICD-10-CM | POA: Diagnosis present

## 2019-06-05 DIAGNOSIS — Z8349 Family history of other endocrine, nutritional and metabolic diseases: Secondary | ICD-10-CM

## 2019-06-05 DIAGNOSIS — F419 Anxiety disorder, unspecified: Secondary | ICD-10-CM | POA: Diagnosis present

## 2019-06-05 DIAGNOSIS — M79661 Pain in right lower leg: Secondary | ICD-10-CM | POA: Diagnosis not present

## 2019-06-05 DIAGNOSIS — I213 ST elevation (STEMI) myocardial infarction of unspecified site: Secondary | ICD-10-CM

## 2019-06-05 DIAGNOSIS — I10 Essential (primary) hypertension: Secondary | ICD-10-CM | POA: Diagnosis present

## 2019-06-05 DIAGNOSIS — Z20828 Contact with and (suspected) exposure to other viral communicable diseases: Secondary | ICD-10-CM | POA: Diagnosis not present

## 2019-06-05 DIAGNOSIS — I472 Ventricular tachycardia: Secondary | ICD-10-CM | POA: Diagnosis not present

## 2019-06-05 DIAGNOSIS — I319 Disease of pericardium, unspecified: Secondary | ICD-10-CM | POA: Diagnosis not present

## 2019-06-05 DIAGNOSIS — Z880 Allergy status to penicillin: Secondary | ICD-10-CM | POA: Diagnosis not present

## 2019-06-05 DIAGNOSIS — R700 Elevated erythrocyte sedimentation rate: Secondary | ICD-10-CM | POA: Diagnosis not present

## 2019-06-05 DIAGNOSIS — R079 Chest pain, unspecified: Secondary | ICD-10-CM | POA: Diagnosis present

## 2019-06-05 DIAGNOSIS — I300 Acute nonspecific idiopathic pericarditis: Secondary | ICD-10-CM

## 2019-06-05 DIAGNOSIS — Z8249 Family history of ischemic heart disease and other diseases of the circulatory system: Secondary | ICD-10-CM

## 2019-06-05 DIAGNOSIS — Z6841 Body Mass Index (BMI) 40.0 and over, adult: Secondary | ICD-10-CM

## 2019-06-05 DIAGNOSIS — Z841 Family history of disorders of kidney and ureter: Secondary | ICD-10-CM

## 2019-06-05 DIAGNOSIS — I2101 ST elevation (STEMI) myocardial infarction involving left main coronary artery: Principal | ICD-10-CM

## 2019-06-05 HISTORY — PX: LEFT HEART CATH AND CORONARY ANGIOGRAPHY: CATH118249

## 2019-06-05 LAB — CREATININE, SERUM
Creatinine, Ser: 1.01 mg/dL — ABNORMAL HIGH (ref 0.44–1.00)
GFR calc Af Amer: >60 mL/min (ref >60)
GFR calc non Af Amer: >60 mL/min (ref >60)

## 2019-06-05 LAB — BASIC METABOLIC PANEL
Anion gap: 16 — ABNORMAL HIGH (ref 5–15)
BUN: 14 mg/dL (ref 6–20)
CO2: 23 mmol/L (ref 22–32)
Calcium: 9.4 mg/dL (ref 8.9–10.3)
Chloride: 95 mmol/L — ABNORMAL LOW (ref 98–111)
Creatinine, Ser: 0.93 mg/dL (ref 0.44–1.00)
GFR calc Af Amer: >60 mL/min (ref >60)
GFR calc non Af Amer: >60 mL/min (ref >60)
Glucose, Bld: 128 mg/dL — ABNORMAL HIGH (ref 70–99)
Potassium: 2.9 mmol/L — ABNORMAL LOW (ref 3.5–5.1)
Sodium: 134 mmol/L — ABNORMAL LOW (ref 135–145)

## 2019-06-05 LAB — LIPID PANEL
Cholesterol: 182 mg/dL (ref 0–200)
HDL: 50 mg/dL (ref >40)
LDL Cholesterol: 123 mg/dL — ABNORMAL HIGH (ref 0–99)
Total CHOL/HDL Ratio: 3.6 RATIO
Triglycerides: 43 mg/dL (ref <150)
VLDL: 9 mg/dL (ref 0–40)

## 2019-06-05 LAB — CBC
HCT: 40.9 % (ref 36.0–46.0)
HCT: 38.4 % (ref 36.0–46.0)
Hemoglobin: 13.3 g/dL (ref 12.0–15.0)
Hemoglobin: 12.9 g/dL (ref 12.0–15.0)
MCH: 26.4 pg (ref 26.0–34.0)
MCH: 26.8 pg (ref 26.0–34.0)
MCHC: 32.5 g/dL (ref 30.0–36.0)
MCHC: 33.6 g/dL (ref 30.0–36.0)
MCV: 81.2 fL (ref 80.0–100.0)
MCV: 79.7 fL — ABNORMAL LOW (ref 80.0–100.0)
Platelets: 308 10*3/uL (ref 150–400)
Platelets: 282 10*3/uL (ref 150–400)
RBC: 5.04 MIL/uL (ref 3.87–5.11)
RBC: 4.82 MIL/uL (ref 3.87–5.11)
RDW: 14.7 % (ref 11.5–15.5)
RDW: 14.5 % (ref 11.5–15.5)
WBC: 8.2 10*3/uL (ref 4.0–10.5)
WBC: 8.3 10*3/uL (ref 4.0–10.5)
nRBC: 0 % (ref 0.0–0.2)
nRBC: 0 % (ref 0.0–0.2)

## 2019-06-05 LAB — APTT: aPTT: 173 seconds (ref 24–36)

## 2019-06-05 LAB — SARS CORONAVIRUS 2 (TAT 6-24 HRS): SARS Coronavirus 2: NEGATIVE

## 2019-06-05 LAB — TROPONIN I (HIGH SENSITIVITY)
Troponin I (High Sensitivity): 3 ng/L (ref <18)
Troponin I (High Sensitivity): 4 ng/L (ref <18)

## 2019-06-05 LAB — POC SARS CORONAVIRUS 2 AG -  ED: SARS Coronavirus 2 Ag: NEGATIVE

## 2019-06-05 LAB — PROTIME-INR
INR: 1.1 (ref 0.8–1.2)
Prothrombin Time: 14.4 seconds (ref 11.4–15.2)

## 2019-06-05 SURGERY — LEFT HEART CATH AND CORONARY ANGIOGRAPHY
Anesthesia: LOCAL

## 2019-06-05 MED ORDER — VENLAFAXINE HCL ER 37.5 MG PO CP24
37.5000 mg | ORAL_CAPSULE | Freq: Every day | ORAL | Status: DC
  Administered 2019-06-05 – 2019-06-08 (×4): 37.5 mg via ORAL
  Filled 2019-06-05 (×6): qty 1

## 2019-06-05 MED ORDER — NITROGLYCERIN IN D5W 200-5 MCG/ML-% IV SOLN
INTRAVENOUS | Status: AC
  Filled 2019-06-05: qty 250

## 2019-06-05 MED ORDER — IOHEXOL 350 MG/ML SOLN
INTRAVENOUS | Status: DC | PRN
  Administered 2019-06-05: 14:00:00 115 mL

## 2019-06-05 MED ORDER — SODIUM CHLORIDE 0.9 % IV SOLN
INTRAVENOUS | Status: AC
  Administered 2019-06-05: 16:00:00 via INTRAVENOUS

## 2019-06-05 MED ORDER — SODIUM CHLORIDE 0.9% FLUSH: 3.0000 mL | Freq: Once | INTRAVENOUS | Status: DC

## 2019-06-05 MED ORDER — FENTANYL CITRATE (PF) 100 MCG/2ML IJ SOLN
INTRAMUSCULAR | Status: AC
  Filled 2019-06-05: qty 2

## 2019-06-05 MED ORDER — VERAPAMIL HCL 2.5 MG/ML IV SOLN
INTRAVENOUS | Status: DC | PRN
  Administered 2019-06-05: 14:00:00 10 mL via INTRA_ARTERIAL

## 2019-06-05 MED ORDER — HEPARIN SODIUM (PORCINE) 1000 UNIT/ML IJ SOLN
INTRAMUSCULAR | Status: DC | PRN
  Administered 2019-06-05: 7000 [IU] via INTRAVENOUS

## 2019-06-05 MED ORDER — MIDAZOLAM HCL 2 MG/2ML IJ SOLN
INTRAMUSCULAR | Status: DC | PRN
  Administered 2019-06-05: 2 mg via INTRAVENOUS

## 2019-06-05 MED ORDER — HEPARIN (PORCINE) IN NACL 1000-0.9 UT/500ML-% IV SOLN
INTRAVENOUS | Status: DC | PRN
  Administered 2019-06-05 (×3): 500 mL

## 2019-06-05 MED ORDER — SODIUM CHLORIDE 0.9% FLUSH: 3.0000 mL | INTRAVENOUS | Status: DC | PRN

## 2019-06-05 MED ORDER — ASPIRIN 81 MG PO CHEW: 324.0000 mg | CHEWABLE_TABLET | Freq: Once | ORAL | Status: AC

## 2019-06-05 MED ORDER — ACETAMINOPHEN 325 MG PO TABS: 650.0000 mg | ORAL_TABLET | ORAL | Status: DC | PRN

## 2019-06-05 MED ORDER — LOSARTAN POTASSIUM-HCTZ 100-12.5 MG PO TABS: 1.0000 | ORAL_TABLET | Freq: Every day | ORAL | Status: DC

## 2019-06-05 MED ORDER — LABETALOL HCL 5 MG/ML IV SOLN: 10.0000 mg | INTRAVENOUS | Status: AC | PRN

## 2019-06-05 MED ORDER — MIDAZOLAM HCL 2 MG/2ML IJ SOLN
INTRAMUSCULAR | Status: AC
  Filled 2019-06-05: qty 2

## 2019-06-05 MED ORDER — SODIUM CHLORIDE 0.9% FLUSH
3.0000 mL | Freq: Two times a day (BID) | INTRAVENOUS | Status: DC
  Administered 2019-06-06 – 2019-06-08 (×5): 3 mL via INTRAVENOUS

## 2019-06-05 MED ORDER — POTASSIUM CHLORIDE CRYS ER 20 MEQ PO TBCR
40.0000 meq | EXTENDED_RELEASE_TABLET | Freq: Once | ORAL | Status: AC
  Administered 2019-06-05: 17:00:00 40 meq via ORAL
  Filled 2019-06-05: qty 4
  Filled 2019-06-05: qty 2

## 2019-06-05 MED ORDER — MORPHINE SULFATE (PF) 2 MG/ML IV SOLN
2.0000 mg | Freq: Once | INTRAVENOUS | Status: AC
  Administered 2019-06-05: 2 mg via INTRAVENOUS
  Filled 2019-06-05: qty 1

## 2019-06-05 MED ORDER — VERAPAMIL HCL 2.5 MG/ML IV SOLN
INTRAVENOUS | Status: AC
  Filled 2019-06-05: qty 2

## 2019-06-05 MED ORDER — HEPARIN (PORCINE) IN NACL 1000-0.9 UT/500ML-% IV SOLN
INTRAVENOUS | Status: AC
  Filled 2019-06-05: qty 1000

## 2019-06-05 MED ORDER — LIDOCAINE HCL (PF) 1 % IJ SOLN
INTRAMUSCULAR | Status: DC | PRN
  Administered 2019-06-05: 2 mL

## 2019-06-05 MED ORDER — SODIUM CHLORIDE 0.9 % IV SOLN: 250.0000 mL | INTRAVENOUS | Status: DC | PRN

## 2019-06-05 MED ORDER — ONDANSETRON HCL 4 MG/2ML IJ SOLN: 4.0000 mg | Freq: Four times a day (QID) | INTRAMUSCULAR | Status: DC | PRN

## 2019-06-05 MED ORDER — VENLAFAXINE HCL 37.5 MG PO TABS: 37.5000 mg | ORAL_TABLET | Freq: Every day | ORAL | Status: DC

## 2019-06-05 MED ORDER — HEPARIN (PORCINE) IN NACL 1000-0.9 UT/500ML-% IV SOLN
INTRAVENOUS | Status: AC
  Filled 2019-06-05: qty 500

## 2019-06-05 MED ORDER — HEPARIN SODIUM (PORCINE) 1000 UNIT/ML IJ SOLN
INTRAMUSCULAR | Status: AC
  Filled 2019-06-05: qty 1

## 2019-06-05 MED ORDER — METOPROLOL SUCCINATE ER 50 MG PO TB24
50.0000 mg | ORAL_TABLET | Freq: Every day | ORAL | Status: DC
  Filled 2019-06-05 (×3): qty 1

## 2019-06-05 MED ORDER — HEPARIN SODIUM (PORCINE) 5000 UNIT/ML IJ SOLN
4000.0000 [IU] | Freq: Once | INTRAMUSCULAR | Status: AC
  Administered 2019-06-05: 13:00:00 4000 [IU] via INTRAVENOUS

## 2019-06-05 MED ORDER — FENTANYL CITRATE (PF) 100 MCG/2ML IJ SOLN
INTRAMUSCULAR | Status: DC | PRN
  Administered 2019-06-05: 25 ug via INTRAVENOUS

## 2019-06-05 MED ORDER — LIDOCAINE HCL (PF) 1 % IJ SOLN
INTRAMUSCULAR | Status: AC
  Filled 2019-06-05: qty 30

## 2019-06-05 MED ORDER — FAMOTIDINE 20 MG PO TABS
10.0000 mg | ORAL_TABLET | Freq: Two times a day (BID) | ORAL | Status: DC
  Administered 2019-06-05 – 2019-06-08 (×7): 10 mg via ORAL
  Filled 2019-06-05 (×7): qty 1

## 2019-06-05 MED ORDER — LOSARTAN POTASSIUM 50 MG PO TABS
100.0000 mg | ORAL_TABLET | Freq: Every day | ORAL | Status: DC
  Administered 2019-06-06 – 2019-06-08 (×3): 100 mg via ORAL
  Filled 2019-06-05 (×3): qty 2

## 2019-06-05 MED ORDER — HYDRALAZINE HCL 20 MG/ML IJ SOLN: 10.0000 mg | INTRAMUSCULAR | Status: AC | PRN

## 2019-06-05 MED ORDER — SODIUM CHLORIDE 0.9 % IV SOLN: INTRAVENOUS | Status: DC

## 2019-06-05 MED ORDER — MORPHINE SULFATE (PF) 2 MG/ML IV SOLN
2.0000 mg | INTRAVENOUS | Status: DC | PRN
  Administered 2019-06-06: 2 mg via INTRAVENOUS
  Filled 2019-06-05: qty 1

## 2019-06-05 MED ORDER — HEPARIN (PORCINE) 25000 UT/250ML-% IV SOLN
INTRAVENOUS | Status: AC
  Filled 2019-06-05: qty 250

## 2019-06-05 MED ORDER — NITROGLYCERIN 1 MG/10 ML FOR IR/CATH LAB
INTRA_ARTERIAL | Status: AC
  Filled 2019-06-05: qty 10

## 2019-06-05 MED ORDER — ASPIRIN 81 MG PO CHEW
CHEWABLE_TABLET | ORAL | Status: AC
  Administered 2019-06-05: 13:00:00 324 mg via ORAL
  Filled 2019-06-05: qty 4

## 2019-06-05 MED ORDER — HEPARIN SODIUM (PORCINE) 5000 UNIT/ML IJ SOLN
5000.0000 [IU] | Freq: Three times a day (TID) | INTRAMUSCULAR | Status: DC
  Administered 2019-06-06 – 2019-06-08 (×7): 5000 [IU] via SUBCUTANEOUS
  Filled 2019-06-05 (×7): qty 1

## 2019-06-05 MED ORDER — HYDROCODONE-ACETAMINOPHEN 5-325 MG PO TABS
1.0000 | ORAL_TABLET | Freq: Four times a day (QID) | ORAL | Status: DC | PRN
  Administered 2019-06-05 – 2019-06-08 (×9): 2 via ORAL
  Filled 2019-06-05 (×9): qty 2

## 2019-06-05 MED ORDER — HYDROCHLOROTHIAZIDE 25 MG PO TABS
25.0000 mg | ORAL_TABLET | Freq: Every day | ORAL | Status: DC
  Administered 2019-06-06 – 2019-06-08 (×3): 25 mg via ORAL
  Filled 2019-06-05 (×3): qty 1

## 2019-06-05 MED ORDER — AMLODIPINE BESYLATE 5 MG PO TABS
5.0000 mg | ORAL_TABLET | Freq: Every day | ORAL | Status: DC
  Administered 2019-06-05 – 2019-06-08 (×4): 5 mg via ORAL
  Filled 2019-06-05 (×4): qty 1

## 2019-06-05 SURGICAL SUPPLY — 13 items
CATH 5FR JL3.5 JR4 ANG PIG MP (CATHETERS) ×1 IMPLANT
CATH LAUNCHER 6FR EBU 3 (CATHETERS) ×1 IMPLANT
DEVICE RAD COMP TR BAND LRG (VASCULAR PRODUCTS) ×1 IMPLANT
GLIDESHEATH SLEND SS 6F .021 (SHEATH) ×1 IMPLANT
GUIDEWIRE INQWIRE 1.5J.035X260 (WIRE) IMPLANT
INQWIRE 1.5J .035X260CM (WIRE) ×2
KIT ENCORE 26 ADVANTAGE (KITS) IMPLANT
KIT HEART LEFT (KITS) ×2 IMPLANT
PACK CARDIAC CATHETERIZATION (CUSTOM PROCEDURE TRAY) ×2 IMPLANT
SYR MEDRAD MARK 7 150ML (SYRINGE) ×2 IMPLANT
TRANSDUCER W/STOPCOCK (MISCELLANEOUS) ×2 IMPLANT
TUBING CIL FLEX 10 FLL-RA (TUBING) ×2 IMPLANT
WIRE COUGAR XT STRL 190CM (WIRE) IMPLANT

## 2019-06-05 NOTE — ED Notes (Signed)
[facility] cancelled. [facility] here to transport patient to [facility] cath lab.

## 2019-06-05 NOTE — ED Triage Notes (Signed)
Per EMS pt reports CP x 1 hour, was sitting in chair when it started. Hx of heart murmur.

## 2019-06-05 NOTE — Interval H&P Note (Signed)
History and Physical Interval Note:  06/05/2019 1:51 PM  Gwendolyn Edwards  has presented today for surgery, with the diagnosis of STEMI.  The various methods of treatment have been discussed with the patient and family. After consideration of risks, benefits and other options for treatment, the patient has consented to  Procedure(s): Coronary/Graft Acute MI Revascularization (N/A) LEFT HEART CATH AND CORONARY ANGIOGRAPHY (N/A) as a surgical intervention.  The patient's history has been reviewed, patient examined, no change in status, stable for surgery.  I have reviewed the patient's chart and labs.  Questions were answered to the patient's satisfaction.     Sherren Mocha

## 2019-06-05 NOTE — ED Notes (Signed)
Attempted report to cath lab. No answer at this time.

## 2019-06-05 NOTE — H&P (Signed)
Cardiology Admission History and Physical:   Patient ID: Gwendolyn Edwards MRN: [id number]; DOB: [birth year]   Admission date: 06/05/2019  Primary Care Provider: Ignatius SpeckingVyas, Dhruv B, MD Primary Cardiologist: No primary care provider on file.  Primary Electrophysiologist:  None   Chief Complaint: Chest pain  Patient Profile:   Gwendolyn Edwards is a 43 y.o. female with hypertension, presenting with anteroseptal STEMI  History of Present Illness:   Gwendolyn Edwards is a 43 year old woman with no history of cardiac disease.  She has been followed for hypertension and was recently evaluated by Dr. Diona BrownerMcDowell for a heart murmur which was demonstrated to be benign.  She has had no prodrome of any exertional chest pain or pressure.  Today at 11:30 AM she developed severe substernal chest pain that began abruptly at rest.  Her pain has persisted and she was initially evaluated at [facility] emergency room where her EKG demonstrated findings suggestive of an anteroseptal STEMI.  A code STEMI was called and [facility].  The patient currently complains of 7/10 chest pain.  It is described as a pressure-like sensation radiating to the back.  There is associated diaphoresis and shortness of breath, no heart palpitations, nausea, or vomiting.  No past history of any similar chest pains.  The patient lives with her 45-year-old daughter.  She works as a LawyerCNA.  She is a lifelong non-smoker.  She has no family history of coronary artery disease.  She has no personal history of diabetes or hyperlipidemia.  Heart Pathway Score:     Past Medical History:  Diagnosis Date  . Acid reflux   . Anxiety   . Essential hypertension     Past Surgical History:  Procedure Laterality Date  . WISDOM TOOTH EXTRACTION       Medications Prior to Admission: Prior to Admission medications   Medication Sig Start Date End Date Taking?  Authorizing Provider  amLODipine (NORVASC) 5 MG tablet Take 5 mg by mouth daily. 05/21/19   [provider]  famotidine (PEPCID) 10 MG tablet Take 10 mg by mouth 2 (two) times daily.    [provider]  losartan-hydrochlorothiazide (HYZAAR) 100-12.5 MG tablet Take 1 tablet by mouth daily.    [provider]  venlafaxine (EFFEXOR) 37.5 MG tablet Take 37.5 mg by mouth daily.    [provider]     Allergies:    Allergies  Allergen Reactions  . Augmentin [Amoxicillin-Pot Clavulanate]     Social History:   Social History   Socioeconomic History  . Marital status: Married    Spouse name: Not on file  . Number of children: Not on file  . Years of education: Not on file  . Highest education level: Not on file  Occupational History  . Not on file  Tobacco Use  . Smoking status: Never Smoker  . Smokeless tobacco: Never Used  Substance and Sexual Activity  . Alcohol use: No  . Drug use: No  . Sexual activity: Not on file  Other Topics Concern  . Not on file  Social History Narrative  . Not on file   Social Determinants of Health   Financial Resource Strain:   . Difficulty of Paying Living Expenses: Not on file  Food Insecurity:   . Worried About Programme researcher, broadcasting/film/videounning Out of Food in the Last Year: Not on file  . Ran Out of Food in the Last Year: Not on file  Transportation  Needs:   . Lack of Transportation (Medical): Not on file  . Lack of Transportation (Non-Medical): Not on file  Physical Activity:   . Days of Exercise per Week: Not on file  . Minutes of Exercise per Session: Not on file  Stress:   . Feeling of Stress : Not on file  Social Connections:   . Frequency of Communication with Friends and Family: Not on file  . Frequency of Social Gatherings with Friends and Family: Not on file  . Attends Religious Services: Not on file  . Active Member of Clubs or Organizations: Not on file  . Attends Banker Meetings: Not on file  .  Marital Status: Not on file  Intimate Partner Violence:   . Fear of Current or Ex-Partner: Not on file  . Emotionally Abused: Not on file  . Physically Abused: Not on file  . Sexually Abused: Not on file    Family History:   The patient's family history includes Cancer in her father; Hepatitis C in her mother; Hypertension in her mother; Kidney disease in her mother; Thyroid disease in her sister.    ROS:  Please see the history of present illness.  All other ROS reviewed and negative.     Physical Exam/Data:   Vitals:   06/05/19 1230 06/05/19 1233 06/05/19 1255  BP:  122/72 126/74  Pulse:  82   Resp:  18 (!) 27  Temp:  98 F (36.7 C)   TempSrc:  Oral   SpO2:  100%   Weight: 117 kg    Height: 5\' 3"  (1.6 m)     No intake or output data in the 24 hours ending 06/05/19 1345 Last 3 Weights 06/05/2019 05/26/2019 12/01/2018  Weight (lbs) 258 lb 265 lb 285 lb  Weight (kg) 117.028 kg 120.203 kg 129.275 kg     Body mass index is 45.7 kg/m.  General:  Well nourished, well developed, pleasant obese woman in mild distress secondary to chest discomfort HEENT: normal Lymph: no adenopathy Neck: no JVD Endocrine:  No thryomegaly Vascular: No carotid bruits; FA pulses 2+ bilaterally, radial pulses 2+ and equal bilaterally  cardiac:  normal S1, S2; RRR; 2/6 systolic ejection murmur at the right upper sternal border Lungs:  clear to auscultation bilaterally, no wheezing, rhonchi or rales  Abd: soft, nontender, no hepatomegaly  Ext: no edema Musculoskeletal:  No deformities, BUE and BLE strength normal and equal Skin: warm and dry  Neuro:  CNs 2-12 intact, no focal abnormalities noted Psych:  Normal affect    EKG:  The ECG that was done  was personally reviewed and demonstrates normal sinus rhythm with LVH, evolving anteroseptal MI, acute  Laboratory Data:  High Sensitivity Troponin:   Recent Labs  Lab 06/05/19 1300  TROPONINIHS 3      Chemistry Recent Labs  Lab  06/05/19 1300  NA 134*  K 2.9*  CL 95*  CO2 23  GLUCOSE 128*  BUN 14  CREATININE 0.93  CALCIUM 9.4  GFRNONAA >60  GFRAA >60  ANIONGAP 16*    No results for input(s): PROT, ALBUMIN, AST, ALT, ALKPHOS, BILITOT in the last 168 hours. Hematology Recent Labs  Lab 06/05/19 1300  WBC 8.2  RBC 5.04  HGB 13.3  HCT 40.9  MCV 81.2  MCH 26.4  MCHC 32.5  RDW 14.7  PLT 308   BNPNo results for input(s): BNP, PROBNP in the last 168 hours.  DDimer No results for input(s): DDIMER in the last 168  hours.   Radiology/Studies:  No results found.  Assessment and Plan:   1. Anteroseptal STEMI: The patient has received aspirin 324 mg, heparin 4000 units IV.  She has ongoing 7/10 chest pain with acute injury current on EKG.  We will proceed with emergency cardiac catheterization and primary PCI if indicated.  Risks, indications, and alternatives are reviewed with the patient in detail.  Emergency implied consent is obtained.  The patient is agreeable after our discussion. 2. Hypertension: Adjust antihypertensive therapy accordingly 3. Lipids: Likely initiate high intensity statin if true STEMI 4. Disposition: Pending cardiac catheterization/PCI results.  Likely will start dual antiplatelet therapy with aspirin and ticagrelor and aggressive risk reduction measures during her hospital stay.  Severity of Illness: The appropriate patient status for this patient is INPATIENT. Inpatient status is judged to be reasonable and necessary in order to provide the required intensity of service to ensure the patient's safety. The patient's presenting symptoms, physical exam findings, and initial radiographic and laboratory data in the context of their chronic comorbidities is felt to place them at high risk for further clinical deterioration. Furthermore, it is not anticipated that the patient will be medically stable for discharge from the hospital within 2 midnights of admission.   * I certify that at the  point of admission it is my clinical judgment that the patient will require inpatient hospital care spanning beyond 2 midnights from the point of admission due to high intensity of service, high risk for further deterioration and high frequency of surveillance required.*   For questions or updates, please contact [place] Please consult [email or web address] for contact info under   Signed, Sherren Mocha, MD  06/05/2019 1:45 PM

## 2019-06-05 NOTE — ED Notes (Signed)
CODE STEMI called [facility].

## 2019-06-05 NOTE — ED Notes (Signed)
Date and time results received: 06/05/19 1:56 PM  (use smartphrase ".now" to insert current time)  Test: ptt Critical Value: 173  Name of Provider Notified: attempted to call cath lab with no answer   Orders Received? Or Actions Taken?:

## 2019-06-05 NOTE — ED Notes (Signed)
[facility] called to transport pt to [facility] cath lab.

## 2019-06-05 NOTE — Progress Notes (Signed)
   06/05/19 1352  Clinical Encounter Type  Visited With Health care provider  Visit Type Initial;ED  Referral From Nurse;Physician   Chaplain responded to a code STEMI, and checked in with the ED Unit secretary, who indicated no needs at this time. Spiritual care services available as needed.   Jeri Lager, Chaplain

## 2019-06-05 NOTE — ED Provider Notes (Signed)
[facility] Provider Note   CSN: [id number] Arrival date & time: 06/05/19  1222     History Chief Complaint  Patient presents with  . Chest Pain    Gwendolyn Edwards is a 43 y.o. female.  Patient states that she started with severe chest discomfort at noon today.  Mild shortness of breath.  Patient has a history of hypertension  The history is provided by the patient. No language interpreter was used.  Chest Pain Pain location:  Substernal area Pain quality: aching   Pain radiates to:  Does not radiate Pain severity:  Moderate Onset quality:  Sudden Duration:  40 minutes Timing:  Constant Progression:  Worsening Chronicity:  New Context: not breathing   Relieved by:  Nothing Worsened by:  Nothing Associated symptoms: no abdominal pain, no back pain, no cough, no fatigue and no headache        Past Medical History:  Diagnosis Date  . Acid reflux   . Anxiety   . Essential hypertension     Patient Active Problem List   Diagnosis Date Noted  . Elevated BP without diagnosis of hypertension 12/01/2018  . Screening for colorectal cancer 12/01/2018  . Encounter for gynecological examination with Papanicolaou smear of cervix 12/01/2018    Past Surgical History:  Procedure Laterality Date  . WISDOM TOOTH EXTRACTION       OB History    Gravida  5   Para  2   Term  2   Preterm      AB  3   Living  2     SAB      TAB      Ectopic      Multiple      Live Births              Family History  Problem Relation Age of Onset  . Kidney disease Mother   . Hepatitis C Mother   . Hypertension Mother   . Cancer Father        Osteosarcoma   . Thyroid disease Sister     Social History   Tobacco Use  . Smoking status: Never Smoker  . Smokeless tobacco: Never Used  Substance Use Topics  . Alcohol use: No  . Drug use: No    Home Medications Prior to Admission medications   Medication Sig Start Date End Date  Taking? Authorizing Provider  amLODipine (NORVASC) 5 MG tablet Take 5 mg by mouth daily. 05/21/19   [provider]  famotidine (PEPCID) 10 MG tablet Take 10 mg by mouth 2 (two) times daily.    [provider]  losartan-hydrochlorothiazide (HYZAAR) 100-12.5 MG tablet Take 1 tablet by mouth daily.    [provider]  venlafaxine (EFFEXOR) 37.5 MG tablet Take 37.5 mg by mouth daily.    [provider]    Allergies    Augmentin [amoxicillin-pot clavulanate]  Review of Systems   Review of Systems  Constitutional: Negative for appetite change and fatigue.  HENT: Negative for congestion, ear discharge and sinus pressure.   Eyes: Negative for discharge.  Respiratory: Negative for cough.   Cardiovascular: Positive for chest pain.  Gastrointestinal: Negative for abdominal pain and diarrhea.  Genitourinary: Negative for frequency and hematuria.  Musculoskeletal: Negative for back pain.  Skin: Negative for rash.  Neurological: Negative for seizures and headaches.  Psychiatric/Behavioral: Negative for hallucinations.    Physical Exam Updated Vital Signs BP 122/72   Pulse 82   Temp  98 F (36.7 C) (Oral)   Resp 18   Ht 5\' 3"  (1.6 m)   Wt 117 kg   LMP 05/22/2019   SpO2 100%   BMI 45.70 kg/m   Physical Exam Vitals and nursing note reviewed.  Constitutional:      Appearance: She is well-developed.  HENT:     Head: Normocephalic.     Nose: Nose normal.  Eyes:     General: No scleral icterus.    Conjunctiva/sclera: Conjunctivae normal.  Neck:     Thyroid: No thyromegaly.  Cardiovascular:     Rate and Rhythm: Normal rate and regular rhythm.     Heart sounds: No murmur. No friction rub. No gallop.   Pulmonary:     Breath sounds: No stridor. No wheezing or rales.  Chest:     Chest wall: No tenderness.  Abdominal:     General: There is no distension.     Tenderness: There is no abdominal tenderness. There is no rebound.  Musculoskeletal:         General: Normal range of motion.     Cervical back: Neck supple.  Lymphadenopathy:     Cervical: No cervical adenopathy.  Skin:    Findings: No erythema or rash.  Neurological:     Mental Status: She is alert and oriented to person, place, and time.     Motor: No abnormal muscle tone.     Coordination: Coordination normal.  Psychiatric:        Behavior: Behavior normal.     ED Results / Procedures / Treatments   Labs (all labs ordered are listed, but only abnormal results are displayed) Labs Reviewed  RESPIRATORY PANEL BY RT PCR (FLU A&B, COVID)  BASIC METABOLIC PANEL  CBC  PROTIME-INR  APTT  LIPID PANEL  POC URINE PREG, ED  POC URINE PREG, ED  TROPONIN I (HIGH SENSITIVITY)    EKG EKG Interpretation  Date/Time:  Sunday June 05 2019 12:31:56 EST Ventricular Rate:  76 PR Interval:  156 QRS Duration: 86 QT Interval:  424 QTC Calculation: 477 R Axis:   53 Text Interpretation: Normal sinus rhythm T wave abnormality, consider anterior ischemia Prolonged QT Abnormal ECG Confirmed by Bethann BerkshireZammit, Stephannie Broner [phone]([id number]) on 06/05/2019 12:39:18 PM   Radiology No results found.  Procedures Procedures (including critical care time)  Medications Ordered in ED Medications  sodium chloride flush (NS) 0.9 % injection 3 mL (3 mLs Intravenous Not Given 06/05/19 1244)  0.9 %  sodium chloride infusion (has no administration in time range)  nitroGLYCERIN 0.2 mg/mL in dextrose 5 % infusion (  Not Given 06/05/19 1243)  heparin 25000-0.45 UT/250ML-% infusion (has no administration in time range)  morphine 2 MG/ML injection 2 mg (has no administration in time range)  aspirin chewable tablet 324 mg (324 mg Oral Given 06/05/19 1245)  heparin injection 4,000 Units (4,000 Units Intravenous Given 06/05/19 1243)    ED Course  I have reviewed the triage vital signs and the nursing notes.  Pertinent labs & imaging results that were available during my care of the patient were reviewed by me and  considered in my medical decision making (see chart for details).    MDM Rules/Calculators/A&P     CHA2DS2/VAS Stroke Risk Points      N/A >= 2 Points: High Risk  1 - 1.99 Points: Medium Risk  0 Points: Low Risk    A final score could not be computed because of missing components.: Last  Change: N/A  This score determines the patient's risk of having a stroke if the  patient has atrial fibrillation.      This score is not applicable to this patient. Components are not  calculated.        CRITICAL CARE Performed by: Bethann Berkshire Total critical care time: 31 minutes Critical care time was exclusive of separately billable procedures and treating other patients. Critical care was necessary to treat or prevent imminent or life-threatening deterioration. Critical care was time spent personally by me on the following activities: development of treatment plan with patient and/or surrogate as well as nursing, discussions with consultants, evaluation of patient's response to treatment, examination of patient, obtaining history from patient or surrogate, ordering and performing treatments and interventions, ordering and review of laboratory studies, ordering and review of radiographic studies, pulse oximetry and re-evaluation of patient's condition.             EKG shows significant ST elevation in V1 V2 and some in V3.  STEMI was activated immediately.  I spoke with Dr. Excell Seltzer in cardiology and he reviewed EKG and agree the patient go to the Cath Lab.  She will get heparin and be transported immediately Final Clinical Impression(s) / ED Diagnoses Final diagnoses:  Acute ST elevation myocardial infarction (STEMI) involving left main coronary artery [facility])    Rx / DC Orders ED Discharge Orders    None       Bethann Berkshire, MD 06/05/19 1253

## 2019-06-06 ENCOUNTER — Encounter: Payer: Self-pay | Admitting: Cardiology

## 2019-06-06 ENCOUNTER — Inpatient Hospital Stay (HOSPITAL_COMMUNITY): Payer: BC Managed Care – PPO

## 2019-06-06 ENCOUNTER — Ambulatory Visit (HOSPITAL_COMMUNITY): Payer: BC Managed Care – PPO

## 2019-06-06 DIAGNOSIS — Z880 Allergy status to penicillin: Secondary | ICD-10-CM | POA: Diagnosis not present

## 2019-06-06 DIAGNOSIS — I10 Essential (primary) hypertension: Secondary | ICD-10-CM | POA: Diagnosis present

## 2019-06-06 DIAGNOSIS — R9431 Abnormal electrocardiogram [ECG] [EKG]: Secondary | ICD-10-CM | POA: Diagnosis not present

## 2019-06-06 DIAGNOSIS — I472 Ventricular tachycardia: Secondary | ICD-10-CM | POA: Diagnosis not present

## 2019-06-06 DIAGNOSIS — Z841 Family history of disorders of kidney and ureter: Secondary | ICD-10-CM | POA: Diagnosis not present

## 2019-06-06 DIAGNOSIS — Z6841 Body Mass Index (BMI) 40.0 and over, adult: Secondary | ICD-10-CM | POA: Diagnosis not present

## 2019-06-06 DIAGNOSIS — I319 Disease of pericardium, unspecified: Secondary | ICD-10-CM | POA: Diagnosis present

## 2019-06-06 DIAGNOSIS — Z8349 Family history of other endocrine, nutritional and metabolic diseases: Secondary | ICD-10-CM | POA: Diagnosis not present

## 2019-06-06 DIAGNOSIS — M79661 Pain in right lower leg: Secondary | ICD-10-CM | POA: Diagnosis not present

## 2019-06-06 DIAGNOSIS — K219 Gastro-esophageal reflux disease without esophagitis: Secondary | ICD-10-CM | POA: Diagnosis present

## 2019-06-06 DIAGNOSIS — E669 Obesity, unspecified: Secondary | ICD-10-CM | POA: Diagnosis present

## 2019-06-06 DIAGNOSIS — Z79899 Other long term (current) drug therapy: Secondary | ICD-10-CM | POA: Diagnosis not present

## 2019-06-06 DIAGNOSIS — Z20828 Contact with and (suspected) exposure to other viral communicable diseases: Secondary | ICD-10-CM | POA: Diagnosis present

## 2019-06-06 DIAGNOSIS — I300 Acute nonspecific idiopathic pericarditis: Secondary | ICD-10-CM | POA: Diagnosis not present

## 2019-06-06 DIAGNOSIS — F419 Anxiety disorder, unspecified: Secondary | ICD-10-CM | POA: Diagnosis present

## 2019-06-06 DIAGNOSIS — R079 Chest pain, unspecified: Secondary | ICD-10-CM | POA: Diagnosis present

## 2019-06-06 DIAGNOSIS — Z8249 Family history of ischemic heart disease and other diseases of the circulatory system: Secondary | ICD-10-CM | POA: Diagnosis not present

## 2019-06-06 DIAGNOSIS — E876 Hypokalemia: Secondary | ICD-10-CM | POA: Diagnosis present

## 2019-06-06 DIAGNOSIS — R700 Elevated erythrocyte sedimentation rate: Secondary | ICD-10-CM | POA: Diagnosis not present

## 2019-06-06 LAB — BASIC METABOLIC PANEL
Anion gap: 10 (ref 5–15)
BUN: 10 mg/dL (ref 6–20)
CO2: 27 mmol/L (ref 22–32)
Calcium: 8.9 mg/dL (ref 8.9–10.3)
Chloride: 100 mmol/L (ref 98–111)
Creatinine, Ser: 0.89 mg/dL (ref 0.44–1.00)
GFR calc Af Amer: >60 mL/min (ref >60)
GFR calc non Af Amer: >60 mL/min (ref >60)
Glucose, Bld: 156 mg/dL — ABNORMAL HIGH (ref 70–99)
Potassium: 3.4 mmol/L — ABNORMAL LOW (ref 3.5–5.1)
Sodium: 137 mmol/L (ref 135–145)

## 2019-06-06 LAB — CBC
HCT: 41 % (ref 36.0–46.0)
Hemoglobin: 13.2 g/dL (ref 12.0–15.0)
MCH: 26.6 pg (ref 26.0–34.0)
MCHC: 32.2 g/dL (ref 30.0–36.0)
MCV: 82.7 fL (ref 80.0–100.0)
Platelets: 248 10*3/uL (ref 150–400)
RBC: 4.96 MIL/uL (ref 3.87–5.11)
RDW: 14.8 % (ref 11.5–15.5)
WBC: 4.3 10*3/uL (ref 4.0–10.5)
nRBC: 0 % (ref 0.0–0.2)

## 2019-06-06 LAB — TROPONIN I (HIGH SENSITIVITY)
Troponin I (High Sensitivity): 22 ng/L — ABNORMAL HIGH (ref <18)
Troponin I (High Sensitivity): 20 ng/L — ABNORMAL HIGH (ref <18)

## 2019-06-06 LAB — ECHOCARDIOGRAM COMPLETE
Height: 63 in
Weight: 4100.8 oz

## 2019-06-06 LAB — SEDIMENTATION RATE: Sed Rate: 38 mm/hr — ABNORMAL HIGH (ref 0–22)

## 2019-06-06 LAB — D-DIMER, QUANTITATIVE: D-Dimer, Quant: <0.27 ug/mL-FEU (ref 0.00–0.50)

## 2019-06-06 LAB — MAGNESIUM: Magnesium: 2 mg/dL (ref 1.7–2.4)

## 2019-06-06 LAB — GLUCOSE, CAPILLARY: Glucose-Capillary: 98 mg/dL (ref 70–99)

## 2019-06-06 LAB — C-REACTIVE PROTEIN: CRP: 1.1 mg/dL — ABNORMAL HIGH (ref <1.0)

## 2019-06-06 LAB — RESPIRATORY PANEL BY RT PCR (FLU A&B, COVID)
Influenza A by PCR: NEGATIVE
Influenza B by PCR: NEGATIVE
SARS Coronavirus 2 by RT PCR: NEGATIVE

## 2019-06-06 MED ORDER — IOHEXOL 350 MG/ML SOLN
75.0000 mL | Freq: Once | INTRAVENOUS | Status: AC | PRN
  Administered 2019-06-06: 17:00:00 75 mL via INTRAVENOUS

## 2019-06-06 MED ORDER — COLCHICINE 0.6 MG PO TABS
0.6000 mg | ORAL_TABLET | Freq: Two times a day (BID) | ORAL | Status: DC
  Administered 2019-06-06 – 2019-06-08 (×5): 0.6 mg via ORAL
  Filled 2019-06-06 (×5): qty 1

## 2019-06-06 MED ORDER — POTASSIUM CHLORIDE CRYS ER 20 MEQ PO TBCR
40.0000 meq | EXTENDED_RELEASE_TABLET | Freq: Once | ORAL | Status: AC
  Administered 2019-06-06: 13:00:00 40 meq via ORAL
  Filled 2019-06-06: qty 2

## 2019-06-06 MED FILL — Nitroglycerin IV Soln 100 MCG/ML in D5W: INTRA_ARTERIAL | Qty: 10 | Status: AC

## 2019-06-06 NOTE — Progress Notes (Signed)
  Echocardiogram 2D Echocardiogram has been performed.  Gwendolyn Edwards 06/06/2019, 11:10 AM

## 2019-06-06 NOTE — Progress Notes (Addendum)
The patient has been seen in conjunction with Laverda Page, NP. All aspects of care have been considered and discussed. The patient has been personally interviewed, examined, and all clinical data has been reviewed.   Digital coronary images reviewed.  Official echocardiogram result is pending.  There is a small posterior pericardial effusion.  No pericardial friction rub noted.All EKGs are abnormal with precordial ST elevation and biphasic T waves which progressively improved over 30 minutes in the emergency room.  Plan to await official echocardiogram results, recycle markers, and repeat EKG today.  May have cardiomyopathy.  Chest pain may have no cardiac explanation unless pericarditis.  CT angiogram of the chest to rule out dissection.  Start colchicine for possible inflammatory pericardial process.  Progress Note  Patient Name: Gwendolyn Edwards Date of Encounter: 06/06/2019  Primary Cardiologist: No primary care provider on file.   Subjective   Still with chest heaviness this morning. Had some improvement with Norco.   Inpatient Medications    Scheduled Meds: . amLODipine  5 mg Oral Daily  . famotidine  10 mg Oral BID  . heparin  5,000 Units Subcutaneous Q8H  . hydrochlorothiazide  25 mg Oral Daily  . losartan  100 mg Oral Daily  . metoprolol succinate  50 mg Oral Daily  . sodium chloride flush  3 mL Intravenous Once  . sodium chloride flush  3 mL Intravenous Q12H  . venlafaxine XR  37.5 mg Oral Daily   Continuous Infusions: . sodium chloride    . sodium chloride     PRN Meds: sodium chloride, acetaminophen, HYDROcodone-acetaminophen, morphine injection, ondansetron (ZOFRAN) IV, sodium chloride flush   Vital Signs    Vitals:   06/05/19 2044 06/05/19 2337 06/06/19 0514 06/06/19 0826  BP: 109/66 121/69 125/69 136/69  Pulse: 72 70 75 61  Resp: 18 16 18 16   Temp: 99.1 F (37.3 C) 98.1 F (36.7 C) 97.9 F (36.6 C) 98 F (36.7 C)  TempSrc: Oral  Oral Oral Oral  SpO2: 99% 99% 100% 99%  Weight:   116.3 kg   Height:        Intake/Output Summary (Last 24 hours) at 06/06/2019 0943 Last data filed at 06/06/2019 0200 Gross per 24 hour  Intake 840 ml  Output 700 ml  Net 140 ml   Last 3 Weights 06/06/2019 06/05/2019 05/26/2019  Weight (lbs) 256 lb 4.8 oz 258 lb 265 lb  Weight (kg) 116.257 kg 117.028 kg 120.203 kg      Telemetry    SR - Personally Reviewed  ECG    SR with improving TW changes in anterior leads - Personally Reviewed  Physical Exam  Younger AAF, laying in bed. GEN: No acute distress.   Neck: No JVD Cardiac: RRR, no murmurs, rubs, or gallops.  Respiratory: Clear to auscultation bilaterally. GI: Soft, nontender, non-distended  MS: No edema; No deformity. Neuro:  Nonfocal  Psych: Normal affect   Labs    High Sensitivity Troponin:   Recent Labs  Lab 06/05/19 1300 06/05/19 1526  TROPONINIHS 3 4      Chemistry Recent Labs  Lab 06/05/19 1300 06/05/19 1526  NA 134*  --   K 2.9*  --   CL 95*  --   CO2 23  --   GLUCOSE 128*  --   BUN 14  --   CREATININE 0.93 1.01*  CALCIUM 9.4  --   GFRNONAA >60 >60  GFRAA >60 >60  ANIONGAP 16*  --  Hematology Recent Labs  Lab 06/05/19 1300 06/05/19 1526  WBC 8.2 8.3  RBC 5.04 4.82  HGB 13.3 12.9  HCT 40.9 38.4  MCV 81.2 79.7*  MCH 26.4 26.8  MCHC 32.5 33.6  RDW 14.7 14.5  PLT 308 282    BNPNo results for input(s): BNP, PROBNP in the last 168 hours.   DDimer No results for input(s): DDIMER in the last 168 hours.   Radiology    CARDIAC CATHETERIZATION  Result Date: 06/05/2019 1.  Patent coronary arteries with minimal nonobstructive coronary artery disease, no evidence of a culprit lesion for STEMI. 2.  Hyperdynamic/normal LV systolic function with LVEF greater than 65% and normal LVEDP Suspect noncardiac chest pain.  We will cycle high-sensitivity troponins, check an echocardiogram, and if symptoms resolve and study results are  reassuring, likely discharge home tomorrow.  CXR Portable same day  Result Date: 06/05/2019 CLINICAL DATA:  Inpatient encounter for chest pain at rest EXAM: PORTABLE CHEST 1 VIEW COMPARISON:  Chest radiograph 03/05/2018 FINDINGS: Stable cardiomediastinal contours. Heart size is upper limits of normal which may be secondary to AP technique. The lungs are clear. No pneumothorax or large pleural effusion. No acute finding in the visualized skeleton. IMPRESSION: No acute cardiopulmonary process. Electronically Signed   By: Audie Pinto M.D.   On: 06/05/2019 19:03    Cardiac Studies   Cardiac cath: 06/05/2019  1.  Patent coronary arteries with minimal nonobstructive coronary artery disease, no evidence of a culprit lesion for STEMI. 2.  Hyperdynamic/normal LV systolic function with LVEF greater than 65% and normal LVEDP  Suspect noncardiac chest pain.  We will cycle high-sensitivity troponins, check an echocardiogram, and if symptoms resolve and study results are reassuring, likely discharge home tomorrow.  Patient Profile     43 y.o. female with HTN who presented with EKG concerning for anteroseptal STEMI. Underwent cardiac cath noted above.   Assessment & Plan    1. Chest pain with abnormal EKG: taken to the cath lab but found to have minimal nonobstructive CAD and no culprit lesion for STEMI changes. Hst were negative. Echo is pending. Still with ongoing chest pain this morning radiating through to her back.  -- will check Ddimer, Sed rate and CRP  2. HTN: controlled with current therapy  3. Hypokalemia: K+ 2.9 yesterday. BMET pending this am  For questions or updates, please contact [facility] Please consult [email or web address] for contact info under   Signed, Reino Bellis, NP  06/06/2019, 9:43 AM

## 2019-06-07 DIAGNOSIS — I300 Acute nonspecific idiopathic pericarditis: Secondary | ICD-10-CM

## 2019-06-07 LAB — BASIC METABOLIC PANEL
Anion gap: 10 (ref 5–15)
BUN: 11 mg/dL (ref 6–20)
CO2: 24 mmol/L (ref 22–32)
Calcium: 9 mg/dL (ref 8.9–10.3)
Chloride: 102 mmol/L (ref 98–111)
Creatinine, Ser: 0.82 mg/dL (ref 0.44–1.00)
GFR calc Af Amer: >60 mL/min (ref >60)
GFR calc non Af Amer: >60 mL/min (ref >60)
Glucose, Bld: 88 mg/dL (ref 70–99)
Potassium: 3.6 mmol/L (ref 3.5–5.1)
Sodium: 136 mmol/L (ref 135–145)

## 2019-06-07 MED ORDER — MAGNESIUM HYDROXIDE 400 MG/5ML PO SUSP
15.0000 mL | Freq: Every day | ORAL | Status: DC | PRN
  Filled 2019-06-07: qty 30

## 2019-06-07 MED ORDER — METOPROLOL SUCCINATE ER 25 MG PO TB24
25.0000 mg | ORAL_TABLET | Freq: Every day | ORAL | Status: DC
  Administered 2019-06-08: 09:00:00 25 mg via ORAL
  Filled 2019-06-07: qty 1

## 2019-06-07 MED ORDER — IBUPROFEN 600 MG PO TABS
600.0000 mg | ORAL_TABLET | Freq: Three times a day (TID) | ORAL | Status: DC
  Administered 2019-06-07 – 2019-06-08 (×4): 600 mg via ORAL
  Filled 2019-06-07 (×4): qty 1

## 2019-06-07 MED ORDER — POTASSIUM CHLORIDE CRYS ER 20 MEQ PO TBCR
40.0000 meq | EXTENDED_RELEASE_TABLET | Freq: Once | ORAL | Status: AC
  Administered 2019-06-07: 40 meq via ORAL
  Filled 2019-06-07: qty 2

## 2019-06-07 NOTE — Plan of Care (Signed)
  Problem: Education: Goal: Understanding of cardiac disease, CV risk reduction, and recovery process will improve Outcome: Progressing   Problem: Activity: Goal: Ability to tolerate increased activity will improve Outcome: Progressing   Problem: Cardiac: Goal: Ability to achieve and maintain adequate cardiopulmonary perfusion will improve Outcome: Progressing

## 2019-06-07 NOTE — Plan of Care (Signed)
  Problem: Education: Goal: Understanding of cardiac disease, CV risk reduction, and recovery process will improve 06/07/2019 0420 by Rexford Maus, RN Outcome: Progressing 06/07/2019 0418 by Rexford Maus, RN Outcome: Progressing Goal: Understanding of medication regimen will improve Outcome: Progressing   Problem: Activity: Goal: Ability to tolerate increased activity will improve Outcome: Progressing

## 2019-06-07 NOTE — Progress Notes (Addendum)
The patient has been seen in conjunction with Reino Bellis, NP. All aspects of care have been considered and discussed. The patient has been personally interviewed, examined, and all clinical data has been reviewed.   All clinical data has been reviewed.  Also reviewed the echocardiogram where an inferobasal region of hypokinesis was diagnosed.  I am not so sure about the of that interpretation.  May be tangential echo slices through the mitral valve annulus.  Severe obstructive CAD, pericardial tamponade, and aortic dissection have been excluded  Clinical symptoms could represent inflammatory disease/pericarditis.  If there is a focal inferobasal wall motion abnormality this would further imply myocarditis.  Depending upon course, she may need to have a cardiac MRI to determine if there is evidence of inflammation versus normal.  Colchicine was started yesterday with some improvement.  Will start nonsteroidal anti-inflammatory therapy today.  When she has better symptomatic control she will be eligible for discharge.  It is okay for her to walk in the hall today.  Explained the need for decreased physical activity for the next 7 to 10 days at home allowing the process to resolve.   Progress Note  Patient Name: Gwendolyn Edwards Date of Encounter: 06/07/2019  Primary Cardiologist: No primary care provider on file.   Subjective   Feeling somewhat better today. Still with mild episodes of chest pain. Tried to ambulate this morning and developed chest pressure.  Inpatient Medications    Scheduled Meds: . amLODipine  5 mg Oral Daily  . colchicine  0.6 mg Oral BID  . famotidine  10 mg Oral BID  . heparin  5,000 Units Subcutaneous Q8H  . hydrochlorothiazide  25 mg Oral Daily  . ibuprofen  600 mg Oral TID  . losartan  100 mg Oral Daily  . [START ON 06/08/2019] metoprolol succinate  25 mg Oral Daily  . sodium chloride flush  3 mL Intravenous Once  . sodium chloride flush  3 mL  Intravenous Q12H  . venlafaxine XR  37.5 mg Oral Daily   Continuous Infusions: . sodium chloride    . sodium chloride     PRN Meds: sodium chloride, acetaminophen, HYDROcodone-acetaminophen, morphine injection, ondansetron (ZOFRAN) IV, sodium chloride flush   Vital Signs    Vitals:   06/06/19 1223 06/06/19 2024 06/07/19 0009 06/07/19 0426  BP: 122/63 112/64 (!) 106/53 126/73  Pulse: 66 78 65 66  Resp: 16 18 18 20   Temp: 98.8 F (37.1 C) 98 F (36.7 C) 97.9 F (36.6 C) 97.9 F (36.6 C)  TempSrc: Oral Oral Oral Oral  SpO2: 99% 100% 99% 100%  Weight:    116.6 kg  Height:        Intake/Output Summary (Last 24 hours) at 06/07/2019 1039 Last data filed at 06/07/2019 1014 Gross per 24 hour  Intake 1560 ml  Output 251 ml  Net 1309 ml   Last 3 Weights 06/07/2019 06/06/2019 06/05/2019  Weight (lbs) 257 lb 1.6 oz 256 lb 4.8 oz 258 lb  Weight (kg) 116.62 kg 116.257 kg 117.028 kg      Telemetry    SR with run of NSVT (20 beats) - Personally Reviewed  ECG    No new tracing.  Physical Exam  Pleasant AAF, laying in bed. GEN: No acute distress.   Neck: No JVD Cardiac: RRR, no murmurs, rubs, or gallops.  Respiratory: Clear to auscultation bilaterally. GI: Soft, nontender, non-distended  MS: No edema; No deformity. Right radial site stable.  Neuro:  Nonfocal  Psych: Normal affect   Labs    High Sensitivity Troponin:   Recent Labs  Lab 06/05/19 1300 06/05/19 1526 06/06/19 1200 06/06/19 1426  TROPONINIHS 3 4 22* 20*      Chemistry Recent Labs  Lab 06/05/19 1300 06/05/19 1526 06/06/19 0942  NA 134*  --  137  K 2.9*  --  3.4*  CL 95*  --  100  CO2 23  --  27  GLUCOSE 128*  --  156*  BUN 14  --  10  CREATININE 0.93 1.01* 0.89  CALCIUM 9.4  --  8.9  GFRNONAA >60 >60 >60  GFRAA >60 >60 >60  ANIONGAP 16*  --  10     Hematology Recent Labs  Lab 06/05/19 1300 06/05/19 1526 06/06/19 0942  WBC 8.2 8.3 4.3  RBC 5.04 4.82 4.96  HGB 13.3 12.9 13.2  HCT  40.9 38.4 41.0  MCV 81.2 79.7* 82.7  MCH 26.4 26.8 26.6  MCHC 32.5 33.6 32.2  RDW 14.7 14.5 14.8  PLT 308 282 248    BNPNo results for input(s): BNP, PROBNP in the last 168 hours.   DDimer  Recent Labs  Lab 06/06/19 0981  DDIMER <0.27     Radiology    CARDIAC CATHETERIZATION  Result Date: 06/05/2019 1.  Patent coronary arteries with minimal nonobstructive coronary artery disease, no evidence of a culprit lesion for STEMI. 2.  Hyperdynamic/normal LV systolic function with LVEF greater than 65% and normal LVEDP Suspect noncardiac chest pain.  We will cycle high-sensitivity troponins, check an echocardiogram, and if symptoms resolve and study results are reassuring, likely discharge home tomorrow.  CXR Portable same day  Result Date: 06/05/2019 CLINICAL DATA:  Inpatient encounter for chest pain at rest EXAM: PORTABLE CHEST 1 VIEW COMPARISON:  Chest radiograph 03/05/2018 FINDINGS: Stable cardiomediastinal contours. Heart size is upper limits of normal which may be secondary to AP technique. The lungs are clear. No pneumothorax or large pleural effusion. No acute finding in the visualized skeleton. IMPRESSION: No acute cardiopulmonary process. Electronically Signed   By: Emmaline Kluver M.D.   On: 06/05/2019 19:03   CT ANGIO CHEST AORTA W/CM & OR WO/CM  Result Date: 06/06/2019 CLINICAL DATA:  Chest and back pain.  Aortic dissection suspected. EXAM: CT ANGIOGRAPHY CHEST WITH CONTRAST TECHNIQUE: Multidetector CT imaging of the chest was performed using the standard protocol during bolus administration of intravenous contrast. Multiplanar CT image reconstructions and MIPs were obtained to evaluate the vascular anatomy. CONTRAST:  51mL OMNIPAQUE IOHEXOL 350 MG/ML SOLN COMPARISON:  Chest radiograph 1 day prior. CTA of the chest 03/06/2018. FINDINGS: Cardiovascular: Normal aortic and great vessel caliber, without dissection or aneurysm. Borderline cardiomegaly, without pericardial effusion. No  central pulmonary embolism, on this non-dedicated study. Mediastinum/Nodes: No mediastinal or hilar adenopathy. Lungs/Pleura: No pleural fluid. 3 mm posterior right upper lobe pulmonary nodule on 50/7 is similar to the 2019 exam and can be presumed benign. Right hemidiaphragm elevation. Upper Abdomen: Probable hepatic steatosis. Normal imaged portions of the spleen, stomach, pancreas, adrenal glands, kidneys. Musculoskeletal: Mid and lower thoracic spondylosis. Review of the MIP images confirms the above findings. IMPRESSION: No acute process in the chest. No evidence of aortic aneurysm or dissection. Electronically Signed   By: Jeronimo Greaves M.D.   On: 06/06/2019 17:21   ECHOCARDIOGRAM COMPLETE  Result Date: 06/06/2019   ECHOCARDIOGRAM REPORT   Patient Name:   KELA BACCARI Date of Exam: 06/06/2019 Medical Rec #:  [id number]  Height:       63.0 in Accession #:    [id number]              Weight:       256.3 lb Date of Birth:  [birth year]               BSA:          2.15 m Patient Age:    43 years                BP:           136/68 mmHg Patient Gender: F                       HR:           63 bpm. Exam Location:  Inpatient Procedure: 2D Echo Indications:    chest pain 786.50  History:        Patient has prior history of Echocardiogram examinations, most                 recent 03/21/2019. Risk Factors:Hypertension.  Sonographer:    Celene SkeenVijay Shankar RDCS (AE) Referring Phys: [id number] MICHAEL COOPER  Sonographer Comments: Image acquisition challenging due to patient body habitus. IMPRESSIONS  1. Left ventricular ejection fraction, by visual estimation, is 50 to 55%. The left ventricle has normal function. There is no left ventricular hypertrophy.  2. The left ventricle demonstrates regional wall motion abnormalities.  3. LVEF is normal with inferoseptal hypokinesis.  4. Global right ventricle has mildly reduced systolic function.The right ventricular size is mildly enlarged. No increase in right  ventricular wall thickness.  5. Left atrial size was normal.  6. Right atrial size was normal.  7. The mitral valve is normal in structure. No evidence of mitral valve regurgitation.  8. The tricuspid valve is normal in structure. Tricuspid valve regurgitation is trivial.  9. The aortic valve is normal in structure. Aortic valve regurgitation is not visualized. 10. The pulmonic valve was normal in structure. Pulmonic valve regurgitation is not visualized. 11. The inferior vena cava is dilated in size with <50% respiratory variability, suggesting right atrial pressure of 15 mmHg. FINDINGS  Left Ventricle: Left ventricular ejection fraction, by visual estimation, is 50 to 55%. The left ventricle has normal function. The left ventricle demonstrates regional wall motion abnormalities. There is no left ventricular hypertrophy. Left ventricular diastolic parameters were normal. LVEF is normal with inferoseptal hypokinesis. Right Ventricle: The right ventricular size is mildly enlarged. No increase in right ventricular wall thickness. Global RV systolic function is has mildly reduced systolic function. Left Atrium: Left atrial size was normal in size. Right Atrium: Right atrial size was normal in size Pericardium: There is no evidence of pericardial effusion. Mitral Valve: The mitral valve is normal in structure. No evidence of mitral valve regurgitation. Tricuspid Valve: The tricuspid valve is normal in structure. Tricuspid valve regurgitation is trivial. Aortic Valve: The aortic valve is normal in structure. Aortic valve regurgitation is not visualized. Pulmonic Valve: The pulmonic valve was normal in structure. Pulmonic valve regurgitation is not visualized. Pulmonic regurgitation is not visualized. Aorta: The aortic root is normal in size and structure. Venous: The inferior vena cava is dilated in size with less than 50% respiratory variability, suggesting right atrial pressure of 15 mmHg. IAS/Shunts: No atrial level  shunt detected by color flow Doppler.  LEFT VENTRICLE PLAX 2D LVIDd:         4.30 cm  Diastology LVIDs:         2.80 cm  LV e' lateral:   13.30 cm/s LV PW:         1.14 cm  LV E/e' lateral: 5.4 LV IVS:        0.89 cm  LV e' medial:    8.27 cm/s LVOT diam:     2.20 cm  LV E/e' medial:  8.7 LV SV:         54 ml LV SV Index:   22.86 LVOT Area:     3.80 cm  RIGHT VENTRICLE RV S prime:     15.00 cm/s TAPSE (M-mode): 2.2 cm LEFT ATRIUM             Index       RIGHT ATRIUM           Index LA diam:        3.20 cm 1.49 cm/m  RA Area:     13.20 cm LA Vol (A2C):   38.9 ml 18.10 ml/m RA Volume:   32.70 ml  15.22 ml/m LA Vol (A4C):   40.3 ml 18.76 ml/m LA Biplane Vol: 41.5 ml 19.31 ml/m  AORTIC VALVE LVOT Vmax:   106.00 cm/s LVOT Vmean:  67.700 cm/s LVOT VTI:    0.190 m  AORTA Ao Root diam: 2.90 cm MITRAL VALVE MV Area (PHT): 3.46 cm             SHUNTS MV PHT:        63.51 msec           Systemic VTI:  0.19 m MV Decel Time: 219 msec             Systemic Diam: 2.20 cm MV E velocity: 71.60 cm/s 103 cm/s MV A velocity: 53.10 cm/s 70.3 cm/s MV E/A ratio:  1.35       1.5  Dietrich Pates MD Electronically signed by Dietrich Pates MD Signature Date/Time: 06/06/2019/2:54:49 PM    Final     Cardiac Studies   Cath: 06/05/19  1.  Patent coronary arteries with minimal nonobstructive coronary artery disease, no evidence of a culprit lesion for STEMI. 2.  Hyperdynamic/normal LV systolic function with LVEF greater than 65% and normal LVEDP  Suspect noncardiac chest pain.  We will cycle high-sensitivity troponins, check an echocardiogram, and if symptoms resolve and study results are reassuring, likely discharge home tomorrow.   TTE: 06/06/19  IMPRESSIONS    1. Left ventricular ejection fraction, by visual estimation, is 50 to 55%. The left ventricle has normal function. There is no left ventricular hypertrophy.  2. The left ventricle demonstrates regional wall motion abnormalities.  3. LVEF is normal with inferoseptal  hypokinesis.  4. Global right ventricle has mildly reduced systolic function.The right ventricular size is mildly enlarged. No increase in right ventricular wall thickness.  5. Left atrial size was normal.  6. Right atrial size was normal.  7. The mitral valve is normal in structure. No evidence of mitral valve regurgitation.  8. The tricuspid valve is normal in structure. Tricuspid valve regurgitation is trivial.  9. The aortic valve is normal in structure. Aortic valve regurgitation is not visualized. 10. The pulmonic valve was normal in structure. Pulmonic valve regurgitation is not visualized. 11. The inferior vena cava is dilated in size with <50% respiratory variability, suggesting right atrial pressure of 15 mmHg.  Patient Profile     43 y.o. female with HTN who presented with EKG concerning for anteroseptal STEMI.  Underwent cardiac cath noted above.   Assessment & Plan    1. Chest pain with abnormal EKG: taken to the cath lab but found to have minimal nonobstructive CAD and no culprit lesion for STEMI changes. Hst were negative. Sed Rate was elevated. Now treating for pericarditis with some improvement. Echo with hypokinesis in the inferoseptal wall.  - will continue with colchicine 0.6mg  BID, add Ibuprofen  TID today  2. HTN: controlled with current therapy  3. Hypokalemia: K+3.4, Bmet pending. Mag+ ok yesterday.  4. NSVT: 20 beat run on telemetry this morning. She has been refusing BB as states her PCP had stopped, and switched to losartan. Reviewed indications for therapy this morning, and she is agreeable to take. Will reduce dosing to  daily as blood pressures are controlled at this time.   For questions or updates, please contact [facility] Please consult [email or web address] for contact info under        Signed, Laverda Page, NP  06/07/2019, 10:39 AM

## 2019-06-08 ENCOUNTER — Inpatient Hospital Stay (HOSPITAL_COMMUNITY): Payer: BC Managed Care – PPO

## 2019-06-08 DIAGNOSIS — I319 Disease of pericardium, unspecified: Secondary | ICD-10-CM

## 2019-06-08 DIAGNOSIS — I300 Acute nonspecific idiopathic pericarditis: Secondary | ICD-10-CM

## 2019-06-08 HISTORY — DX: Disease of pericardium, unspecified: I31.9

## 2019-06-08 MED ORDER — IBUPROFEN 600 MG PO TABS: 600.0000 mg | ORAL_TABLET | Freq: Three times a day (TID) | ORAL | 2 refills | Status: DC

## 2019-06-08 MED ORDER — METOPROLOL SUCCINATE ER 25 MG PO TB24: 25.0000 mg | ORAL_TABLET | Freq: Every day | ORAL | 3 refills | Status: DC

## 2019-06-08 MED ORDER — POTASSIUM CHLORIDE CRYS ER 20 MEQ PO TBCR
40.0000 meq | EXTENDED_RELEASE_TABLET | Freq: Once | ORAL | Status: AC
  Administered 2019-06-08: 13:00:00 40 meq via ORAL

## 2019-06-08 MED ORDER — COLCHICINE 0.6 MG PO TABS: 0.6000 mg | ORAL_TABLET | Freq: Two times a day (BID) | ORAL | 2 refills | Status: DC

## 2019-06-08 MED FILL — IBUPROFEN 600 MG TABLET: 600 | 30 days supply | Qty: 90 | Fill #0

## 2019-06-08 MED FILL — MITIGARE 0.6 MG CAPSULE: 0.6 | 30 days supply | Qty: 60 | Fill #0

## 2019-06-08 NOTE — Plan of Care (Signed)
  Problem: Activity: Goal: Ability to tolerate increased activity will improve Outcome: Progressing   Problem: Cardiac: Goal: Ability to achieve and maintain adequate cardiopulmonary perfusion will improve Outcome: Progressing   

## 2019-06-08 NOTE — Progress Notes (Signed)
Progress Note  Patient Name: Gwendolyn Edwards Date of Encounter: 06/08/2019  Primary Cardiologist: No primary care provider on file.   Subjective   She feels much better today.  Anti-inflammatory therapy has had a dramatic impact on chest pain.  We discussed that this likely represents inflammatory disease, probably pericarditis despite absence of a rub and diagnostic EKG.  Does complain of right calf pain.  This is new.  Inpatient Medications    Scheduled Meds: . amLODipine  5 mg Oral Daily  . colchicine  0.6 mg Oral BID  . famotidine  10 mg Oral BID  . heparin  5,000 Units Subcutaneous Q8H  . hydrochlorothiazide  25 mg Oral Daily  . ibuprofen  600 mg Oral TID  . losartan  100 mg Oral Daily  . metoprolol succinate  25 mg Oral Daily  . sodium chloride flush  3 mL Intravenous Once  . sodium chloride flush  3 mL Intravenous Q12H  . venlafaxine XR  37.5 mg Oral Daily   Continuous Infusions: . sodium chloride    . sodium chloride     PRN Meds: sodium chloride, acetaminophen, HYDROcodone-acetaminophen, magnesium hydroxide, morphine injection, ondansetron (ZOFRAN) IV, sodium chloride flush   Vital Signs    Vitals:   06/07/19 1113 06/07/19 1952 06/08/19 0006 06/08/19 0316  BP: 122/77 103/63 (!) 112/55 109/70  Pulse: 64 72 67 62  Resp:  20 20 20   Temp: 98.3 F (36.8 C) 97.7 F (36.5 C) 98.1 F (36.7 C) 97.9 F (36.6 C)  TempSrc: Oral Oral Oral Oral  SpO2: 100% 99% 99%   Weight:    118 kg  Height:        Intake/Output Summary (Last 24 hours) at 06/08/2019 0911 Last data filed at 06/08/2019 0538 Gross per 24 hour  Intake 960 ml  Output 500 ml  Net 460 ml   Last 3 Weights 06/08/2019 06/07/2019 06/06/2019  Weight (lbs) 260 lb 1.6 oz 257 lb 1.6 oz 256 lb 4.8 oz  Weight (kg) 117.981 kg 116.62 kg 116.257 kg      Telemetry    SR with run of NSVT (20 beats) - Personally Reviewed  ECG    No new tracing.  Physical Exam  Pleasant AAF, laying in  bed. Cardiac: No pericardial rub is heard. Lower extremities: Right lower extremity and left lower extremity are not swollen.  There is pain in the right calf.  Labs    High Sensitivity Troponin:   Recent Labs  Lab 06/05/19 1300 06/05/19 1526 06/06/19 1200 06/06/19 1426  TROPONINIHS 3 4 22* 20*      Chemistry Recent Labs  Lab 06/05/19 1300 06/05/19 1526 06/06/19 0942 06/07/19 1023  NA 134*  --  137 136  K 2.9*  --  3.4* 3.6  CL 95*  --  100 102  CO2 23  --  27 24  GLUCOSE 128*  --  156* 88  BUN 14  --  10 11  CREATININE 0.93 1.01* 0.89 0.82  CALCIUM 9.4  --  8.9 9.0  GFRNONAA >60 >60 >60 >60  GFRAA >60 >60 >60 >60  ANIONGAP 16*  --  10 10     Hematology Recent Labs  Lab 06/05/19 1300 06/05/19 1526 06/06/19 0942  WBC 8.2 8.3 4.3  RBC 5.04 4.82 4.96  HGB 13.3 12.9 13.2  HCT 40.9 38.4 41.0  MCV 81.2 79.7* 82.7  MCH 26.4 26.8 26.6  MCHC 32.5 33.6 32.2  RDW 14.7 14.5 14.8  PLT 308 282 248    BNPNo results for input(s): BNP, PROBNP in the last 168 hours.   DDimer  Recent Labs  Lab 06/06/19 [phone]  DDIMER <0.27     Radiology    CT ANGIO CHEST AORTA W/CM & OR WO/CM  Result Date: 06/06/2019 CLINICAL DATA:  Chest and back pain.  Aortic dissection suspected. EXAM: CT ANGIOGRAPHY CHEST WITH CONTRAST TECHNIQUE: Multidetector CT imaging of the chest was performed using the standard protocol during bolus administration of intravenous contrast. Multiplanar CT image reconstructions and MIPs were obtained to evaluate the vascular anatomy. CONTRAST:  65mL OMNIPAQUE IOHEXOL 350 MG/ML SOLN COMPARISON:  Chest radiograph 1 day prior. CTA of the chest 03/06/2018. FINDINGS: Cardiovascular: Normal aortic and great vessel caliber, without dissection or aneurysm. Borderline cardiomegaly, without pericardial effusion. No central pulmonary embolism, on this non-dedicated study. Mediastinum/Nodes: No mediastinal or hilar adenopathy. Lungs/Pleura: No pleural fluid. 3 mm posterior  right upper lobe pulmonary nodule on 50/7 is similar to the 2019 exam and can be presumed benign. Right hemidiaphragm elevation. Upper Abdomen: Probable hepatic steatosis. Normal imaged portions of the spleen, stomach, pancreas, adrenal glands, kidneys. Musculoskeletal: Mid and lower thoracic spondylosis. Review of the MIP images confirms the above findings. IMPRESSION: No acute process in the chest. No evidence of aortic aneurysm or dissection. Electronically Signed   By: Abigail Miyamoto M.D.   On: 06/06/2019 17:21   ECHOCARDIOGRAM COMPLETE  Result Date: 06/06/2019   ECHOCARDIOGRAM REPORT   Patient Name:   AZZURE GARABEDIAN Date of Exam: 06/06/2019 Medical Rec #:  [id number]               Height:       63.0 in Accession #:    [id number]              Weight:       256.3 lb Date of Birth:  21-Dec-1975               BSA:          2.15 m Patient Age:    64 years                BP:           136/68 mmHg Patient Gender: F                       HR:           63 bpm. Exam Location:  Inpatient Procedure: 2D Echo Indications:    chest pain 786.50  History:        Patient has prior history of Echocardiogram examinations, most                 recent 03/21/2019. Risk Factors:Hypertension.  Sonographer:    Jannett Celestine RDCS (AE) Referring Phys: [place]  Sonographer Comments: Image acquisition challenging due to patient body habitus. IMPRESSIONS  1. Left ventricular ejection fraction, by visual estimation, is 50 to 55%. The left ventricle has normal function. There is no left ventricular hypertrophy.  2. The left ventricle demonstrates regional wall motion abnormalities.  3. LVEF is normal with inferoseptal hypokinesis.  4. Global right ventricle has mildly reduced systolic function.The right ventricular size is mildly enlarged. No increase in right ventricular wall thickness.  5. Left atrial size was normal.  6. Right atrial size was normal.  7. The mitral valve is normal in structure. No evidence of mitral  valve regurgitation.  8. The  tricuspid valve is normal in structure. Tricuspid valve regurgitation is trivial.  9. The aortic valve is normal in structure. Aortic valve regurgitation is not visualized. 10. The pulmonic valve was normal in structure. Pulmonic valve regurgitation is not visualized. 11. The inferior vena cava is dilated in size with <50% respiratory variability, suggesting right atrial pressure of 15 mmHg. FINDINGS  Left Ventricle: Left ventricular ejection fraction, by visual estimation, is 50 to 55%. The left ventricle has normal function. The left ventricle demonstrates regional wall motion abnormalities. There is no left ventricular hypertrophy. Left ventricular diastolic parameters were normal. LVEF is normal with inferoseptal hypokinesis. Right Ventricle: The right ventricular size is mildly enlarged. No increase in right ventricular wall thickness. Global RV systolic function is has mildly reduced systolic function. Left Atrium: Left atrial size was normal in size. Right Atrium: Right atrial size was normal in size Pericardium: There is no evidence of pericardial effusion. Mitral Valve: The mitral valve is normal in structure. No evidence of mitral valve regurgitation. Tricuspid Valve: The tricuspid valve is normal in structure. Tricuspid valve regurgitation is trivial. Aortic Valve: The aortic valve is normal in structure. Aortic valve regurgitation is not visualized. Pulmonic Valve: The pulmonic valve was normal in structure. Pulmonic valve regurgitation is not visualized. Pulmonic regurgitation is not visualized. Aorta: The aortic root is normal in size and structure. Venous: The inferior vena cava is dilated in size with less than 50% respiratory variability, suggesting right atrial pressure of 15 mmHg. IAS/Shunts: No atrial level shunt detected by color flow Doppler.  LEFT VENTRICLE PLAX 2D LVIDd:         4.30 cm  Diastology LVIDs:         2.80 cm  LV e' lateral:   13.30 cm/s LV PW:          1.14 cm  LV E/e' lateral: 5.4 LV IVS:        0.89 cm  LV e' medial:    8.27 cm/s LVOT diam:     2.20 cm  LV E/e' medial:  8.7 LV SV:         54 ml LV SV Index:   22.86 LVOT Area:     3.80 cm  RIGHT VENTRICLE RV S prime:     15.00 cm/s TAPSE (M-mode): 2.2 cm LEFT ATRIUM             Index       RIGHT ATRIUM           Index LA diam:        3.20 cm 1.49 cm/m  RA Area:     13.20 cm LA Vol (A2C):   38.9 ml 18.10 ml/m RA Volume:   32.70 ml  15.22 ml/m LA Vol (A4C):   40.3 ml 18.76 ml/m LA Biplane Vol: 41.5 ml 19.31 ml/m  AORTIC VALVE LVOT Vmax:   106.00 cm/s LVOT Vmean:  67.700 cm/s LVOT VTI:    0.190 m  AORTA Ao Root diam: 2.90 cm MITRAL VALVE MV Area (PHT): 3.46 cm             SHUNTS MV PHT:        63.51 msec           Systemic VTI:  0.19 m MV Decel Time: 219 msec             Systemic Diam: 2.20 cm MV E velocity: 71.60 cm/s 103 cm/s MV A velocity: 53.10 cm/s 70.3 cm/s MV E/A ratio:  1.35  1.5  Dietrich Pates MD Electronically signed by Dietrich Pates MD Signature Date/Time: 06/06/2019/2:54:49 PM    Final     Cardiac Studies   Cath: 06/05/19  1.  Patent coronary arteries with minimal nonobstructive coronary artery disease, no evidence of a culprit lesion for STEMI. 2.  Hyperdynamic/normal LV systolic function with LVEF greater than 65% and normal LVEDP  Suspect noncardiac chest pain.  We will cycle high-sensitivity troponins, check an echocardiogram, and if symptoms resolve and study results are reassuring, likely discharge home tomorrow.   TTE: 06/06/19  IMPRESSIONS    1. Left ventricular ejection fraction, by visual estimation, is 50 to 55%. The left ventricle has normal function. There is no left ventricular hypertrophy.  2. The left ventricle demonstrates regional wall motion abnormalities.  3. LVEF is normal with inferoseptal hypokinesis.  4. Global right ventricle has mildly reduced systolic function.The right ventricular size is mildly enlarged. No increase in right ventricular wall  thickness.  5. Left atrial size was normal.  6. Right atrial size was normal.  7. The mitral valve is normal in structure. No evidence of mitral valve regurgitation.  8. The tricuspid valve is normal in structure. Tricuspid valve regurgitation is trivial.  9. The aortic valve is normal in structure. Aortic valve regurgitation is not visualized. 10. The pulmonic valve was normal in structure. Pulmonic valve regurgitation is not visualized. 11. The inferior vena cava is dilated in size with <50% respiratory variability, suggesting right atrial pressure of 15 mmHg.  Patient Profile     43 y.o. female with HTN who presented with EKG concerning for anteroseptal STEMI. Underwent cardiac cath noted above.   Assessment & Plan    1. Chest pain with abnormal EKG:  Symptoms are improved on combined colchicine and ibuprofen therapy. 2. HTN: controlled with current therapy  3. Hypokalemia: 3.6 yesterday. 4.  Right calf discomfort: Exclude DVT.  If the venous Doppler of the right lower extremity is negative for clot, she can be discharged today.  I would continue combined ibuprofen and colchicine therapy for at least 3 days beyond resolution of symptoms.  At that point we could decrease ibuprofen therapy/discontinue with a relatively slow wean over days.  Would recommend continuing colchicine beyond discontinuation of ibuprofen and if no discomfort after ibuprofen is discontinued colchicine can be decreased to once per day.  If Doppler study positive for right lower extremity clot will need anticoagulation therapy which will increase the risk of having pericardial bleeding.  For questions or updates, please contact [facility] Please consult [email or web address] for contact info under        Signed, Lesleigh Noe, MD  06/08/2019, 9:11 AM     The patient has been seen in conjunction with Laverda Page, NP. All aspects of care have been considered and discussed. The patient has been personally  interviewed, examined, and all clinical data has been reviewed.   All clinical data has been reviewed.  Also reviewed the echocardiogram where an inferobasal region of hypokinesis was diagnosed.  I am not so sure about the of that interpretation.  May be tangential echo slices through the mitral valve annulus.  Severe obstructive CAD, pericardial tamponade, and aortic dissection have been excluded  Clinical symptoms could represent inflammatory disease/pericarditis.  If there is a focal inferobasal wall motion abnormality this would further imply myocarditis.  Depending upon course, she may need to have a cardiac MRI to determine if there is evidence of inflammation versus normal.  Colchicine was started  yesterday with some improvement.  Will start nonsteroidal anti-inflammatory therapy today.  When she has better symptomatic control she will be eligible for discharge.  It is okay for her to walk in the hall today.  Explained the need for decreased physical activity for the next 7 to 10 days at home allowing the process to resolve.   Progress Note  Patient Name: Gwendolyn Edwards Date of Encounter: 06/08/2019  Primary Cardiologist: No primary care provider on file.   Subjective   Feeling somewhat better today. Still with mild episodes of chest pain. Tried to ambulate this morning and developed chest pressure.  Inpatient Medications    Scheduled Meds: . amLODipine  5 mg Oral Daily  . colchicine  0.6 mg Oral BID  . famotidine  10 mg Oral BID  . heparin  5,000 Units Subcutaneous Q8H  . hydrochlorothiazide  25 mg Oral Daily  . ibuprofen  600 mg Oral TID  . losartan  100 mg Oral Daily  . metoprolol succinate  25 mg Oral Daily  . sodium chloride flush  3 mL Intravenous Once  . sodium chloride flush  3 mL Intravenous Q12H  . venlafaxine XR  37.5 mg Oral Daily   Continuous Infusions: . sodium chloride    . sodium chloride     PRN Meds: sodium chloride, acetaminophen,  HYDROcodone-acetaminophen, magnesium hydroxide, morphine injection, ondansetron (ZOFRAN) IV, sodium chloride flush   Vital Signs    Vitals:   06/07/19 1113 06/07/19 1952 06/08/19 0006 06/08/19 0316  BP: 122/77 103/63 (!) 112/55 109/70  Pulse: 64 72 67 62  Resp:  20 20 20   Temp: 98.3 F (36.8 C) 97.7 F (36.5 C) 98.1 F (36.7 C) 97.9 F (36.6 C)  TempSrc: Oral Oral Oral Oral  SpO2: 100% 99% 99%   Weight:    118 kg  Height:        Intake/Output Summary (Last 24 hours) at 06/08/2019 0911 Last data filed at 06/08/2019 0538 Gross per 24 hour  Intake 960 ml  Output 500 ml  Net 460 ml   Last 3 Weights 06/08/2019 06/07/2019 06/06/2019  Weight (lbs) 260 lb 1.6 oz 257 lb 1.6 oz 256 lb 4.8 oz  Weight (kg) 117.981 kg 116.62 kg 116.257 kg      Telemetry    SR with run of NSVT (20 beats) - Personally Reviewed  ECG    No new tracing.  Physical Exam  Pleasant AAF, laying in bed. GEN: No acute distress.   Neck: No JVD Cardiac: RRR, no murmurs, rubs, or gallops.  Respiratory: Clear to auscultation bilaterally. GI: Soft, nontender, non-distended  MS: No edema; No deformity. Right radial site stable.  Neuro:  Nonfocal  Psych: Normal affect   Labs    High Sensitivity Troponin:   Recent Labs  Lab 06/05/19 1300 06/05/19 1526 06/06/19 1200 06/06/19 1426  TROPONINIHS 3 4 22* 20*      Chemistry Recent Labs  Lab 06/05/19 1300 06/05/19 1526 06/06/19 0942 06/07/19 1023  NA 134*  --  137 136  K 2.9*  --  3.4* 3.6  CL 95*  --  100 102  CO2 23  --  27 24  GLUCOSE 128*  --  156* 88  BUN 14  --  10 11  CREATININE 0.93 1.01* 0.89 0.82  CALCIUM 9.4  --  8.9 9.0  GFRNONAA >60 >60 >60 >60  GFRAA >60 >60 >60 >60  ANIONGAP 16*  --  10 10     Hematology  Recent Labs  Lab 06/05/19 1300 06/05/19 1526 06/06/19 0942  WBC 8.2 8.3 4.3  RBC 5.04 4.82 4.96  HGB 13.3 12.9 13.2  HCT 40.9 38.4 41.0  MCV 81.2 79.7* 82.7  MCH 26.4 26.8 26.6  MCHC 32.5 33.6 32.2  RDW 14.7 14.5  14.8  PLT 308 282 248    BNPNo results for input(s): BNP, PROBNP in the last 168 hours.   DDimer  Recent Labs  Lab 06/06/19 [phone]  DDIMER <0.27     Radiology    CT ANGIO CHEST AORTA W/CM & OR WO/CM  Result Date: 06/06/2019 CLINICAL DATA:  Chest and back pain.  Aortic dissection suspected. EXAM: CT ANGIOGRAPHY CHEST WITH CONTRAST TECHNIQUE: Multidetector CT imaging of the chest was performed using the standard protocol during bolus administration of intravenous contrast. Multiplanar CT image reconstructions and MIPs were obtained to evaluate the vascular anatomy. CONTRAST:  75mL OMNIPAQUE IOHEXOL 350 MG/ML SOLN COMPARISON:  Chest radiograph 1 day prior. CTA of the chest 03/06/2018. FINDINGS: Cardiovascular: Normal aortic and great vessel caliber, without dissection or aneurysm. Borderline cardiomegaly, without pericardial effusion. No central pulmonary embolism, on this non-dedicated study. Mediastinum/Nodes: No mediastinal or hilar adenopathy. Lungs/Pleura: No pleural fluid. 3 mm posterior right upper lobe pulmonary nodule on 50/7 is similar to the 2019 exam and can be presumed benign. Right hemidiaphragm elevation. Upper Abdomen: Probable hepatic steatosis. Normal imaged portions of the spleen, stomach, pancreas, adrenal glands, kidneys. Musculoskeletal: Mid and lower thoracic spondylosis. Review of the MIP images confirms the above findings. IMPRESSION: No acute process in the chest. No evidence of aortic aneurysm or dissection. Electronically Signed   By: Jeronimo Greaves M.D.   On: 06/06/2019 17:21   ECHOCARDIOGRAM COMPLETE  Result Date: 06/06/2019   ECHOCARDIOGRAM REPORT   Patient Name:   VERDELLE VALTIERRA Date of Exam: 06/06/2019 Medical Rec #:  [id number]               Height:       63.0 in Accession #:    [id number]              Weight:       256.3 lb Date of Birth:  04/20/1976               BSA:          2.15 m Patient Age:    43 years                BP:           136/68 mmHg Patient  Gender: F                       HR:           63 bpm. Exam Location:  Inpatient Procedure: 2D Echo Indications:    chest pain 786.50  History:        Patient has prior history of Echocardiogram examinations, most                 recent 03/21/2019. Risk Factors:Hypertension.  Sonographer:    Celene Skeen RDCS (AE) Referring Phys: 59 MICHAEL COOPER  Sonographer Comments: Image acquisition challenging due to patient body habitus. IMPRESSIONS  1. Left ventricular ejection fraction, by visual estimation, is 50 to 55%. The left ventricle has normal function. There is no left ventricular hypertrophy.  2. The left ventricle demonstrates regional wall motion abnormalities.  3. LVEF is normal with inferoseptal hypokinesis.  4. Global right  ventricle has mildly reduced systolic function.The right ventricular size is mildly enlarged. No increase in right ventricular wall thickness.  5. Left atrial size was normal.  6. Right atrial size was normal.  7. The mitral valve is normal in structure. No evidence of mitral valve regurgitation.  8. The tricuspid valve is normal in structure. Tricuspid valve regurgitation is trivial.  9. The aortic valve is normal in structure. Aortic valve regurgitation is not visualized. 10. The pulmonic valve was normal in structure. Pulmonic valve regurgitation is not visualized. 11. The inferior vena cava is dilated in size with <50% respiratory variability, suggesting right atrial pressure of 15 mmHg. FINDINGS  Left Ventricle: Left ventricular ejection fraction, by visual estimation, is 50 to 55%. The left ventricle has normal function. The left ventricle demonstrates regional wall motion abnormalities. There is no left ventricular hypertrophy. Left ventricular diastolic parameters were normal. LVEF is normal with inferoseptal hypokinesis. Right Ventricle: The right ventricular size is mildly enlarged. No increase in right ventricular wall thickness. Global RV systolic function is has mildly  reduced systolic function. Left Atrium: Left atrial size was normal in size. Right Atrium: Right atrial size was normal in size Pericardium: There is no evidence of pericardial effusion. Mitral Valve: The mitral valve is normal in structure. No evidence of mitral valve regurgitation. Tricuspid Valve: The tricuspid valve is normal in structure. Tricuspid valve regurgitation is trivial. Aortic Valve: The aortic valve is normal in structure. Aortic valve regurgitation is not visualized. Pulmonic Valve: The pulmonic valve was normal in structure. Pulmonic valve regurgitation is not visualized. Pulmonic regurgitation is not visualized. Aorta: The aortic root is normal in size and structure. Venous: The inferior vena cava is dilated in size with less than 50% respiratory variability, suggesting right atrial pressure of 15 mmHg. IAS/Shunts: No atrial level shunt detected by color flow Doppler.  LEFT VENTRICLE PLAX 2D LVIDd:         4.30 cm  Diastology LVIDs:         2.80 cm  LV e' lateral:   13.30 cm/s LV PW:         1.14 cm  LV E/e' lateral: 5.4 LV IVS:        0.89 cm  LV e' medial:    8.27 cm/s LVOT diam:     2.20 cm  LV E/e' medial:  8.7 LV SV:         54 ml LV SV Index:   22.86 LVOT Area:     3.80 cm  RIGHT VENTRICLE RV S prime:     15.00 cm/s TAPSE (M-mode): 2.2 cm LEFT ATRIUM             Index       RIGHT ATRIUM           Index LA diam:        3.20 cm 1.49 cm/m  RA Area:     13.20 cm LA Vol (A2C):   38.9 ml 18.10 ml/m RA Volume:   32.70 ml  15.22 ml/m LA Vol (A4C):   40.3 ml 18.76 ml/m LA Biplane Vol: 41.5 ml 19.31 ml/m  AORTIC VALVE LVOT Vmax:   106.00 cm/s LVOT Vmean:  67.700 cm/s LVOT VTI:    0.190 m  AORTA Ao Root diam: 2.90 cm MITRAL VALVE MV Area (PHT): 3.46 cm             SHUNTS MV PHT:        63.51 msec  Systemic VTI:  0.19 m MV Decel Time: 219 msec             Systemic Diam: 2.20 cm MV E velocity: 71.60 cm/s 103 cm/s MV A velocity: 53.10 cm/s 70.3 cm/s MV E/A ratio:  1.35       1.5  Dietrich Pates MD Electronically signed by Dietrich Pates MD Signature Date/Time: 06/06/2019/2:54:49 PM    Final     Cardiac Studies   Cath: 06/05/19  1.  Patent coronary arteries with minimal nonobstructive coronary artery disease, no evidence of a culprit lesion for STEMI. 2.  Hyperdynamic/normal LV systolic function with LVEF greater than 65% and normal LVEDP  Suspect noncardiac chest pain.  We will cycle high-sensitivity troponins, check an echocardiogram, and if symptoms resolve and study results are reassuring, likely discharge home tomorrow.   TTE: 06/06/19  IMPRESSIONS    1. Left ventricular ejection fraction, by visual estimation, is 50 to 55%. The left ventricle has normal function. There is no left ventricular hypertrophy.  2. The left ventricle demonstrates regional wall motion abnormalities.  3. LVEF is normal with inferoseptal hypokinesis.  4. Global right ventricle has mildly reduced systolic function.The right ventricular size is mildly enlarged. No increase in right ventricular wall thickness.  5. Left atrial size was normal.  6. Right atrial size was normal.  7. The mitral valve is normal in structure. No evidence of mitral valve regurgitation.  8. The tricuspid valve is normal in structure. Tricuspid valve regurgitation is trivial.  9. The aortic valve is normal in structure. Aortic valve regurgitation is not visualized. 10. The pulmonic valve was normal in structure. Pulmonic valve regurgitation is not visualized. 11. The inferior vena cava is dilated in size with <50% respiratory variability, suggesting right atrial pressure of 15 mmHg.  Patient Profile     43 y.o. female with HTN who presented with EKG concerning for anteroseptal STEMI. Underwent cardiac cath noted above.   Assessment & Plan    1. Chest pain with abnormal EKG: taken to the cath lab but found to have minimal nonobstructive CAD and no culprit lesion for STEMI changes. Hst were negative. Sed Rate was  elevated. Now treating for pericarditis with some improvement. Echo with hypokinesis in the inferoseptal wall.  - will continue with colchicine 0.6mg  BID, add Ibuprofen  TID today  2. HTN: controlled with current therapy  3. Hypokalemia: K+3.4, Bmet pending. Mag+ ok yesterday.  4. NSVT: 20 beat run on telemetry this morning. She has been refusing BB as states her PCP had stopped, and switched to losartan. Reviewed indications for therapy this morning, and she is agreeable to take. Will reduce dosing to  daily as blood pressures are controlled at this time.   For questions or updates, please contact [facility] Please consult [email or web address] for contact info under        Signed, Lesleigh Noe, MD  06/08/2019, 9:11 AM

## 2019-06-08 NOTE — Discharge Summary (Addendum)
The patient has been seen in conjunction with Laverda Page, NP. All aspects of care have been considered and discussed. The patient has been personally interviewed, examined, and all clinical data has been reviewed.  Suspect pericarditis based on clinical response to therapy.  No definite heart data (no effusion, no rub, no diagnostic EKG, minimal elevation in inflammatory markers). Continue ibuprofen for at least 3 days after complete resolution of symptoms.  After discontinuation of ibuprofen, goal 1 additional week with colchicine twice daily before decreasing to 1/day.  1 tablet/day will be continued for 2 to 3 months.   Discharge Summary    Patient ID: Gwendolyn Edwards,  MRN: [id number], DOB/AGE: [birth year] 43 y.o.  Admit date: 06/05/2019 Discharge date: 06/08/2019  Primary Care Provider: Ignatius Specking Primary Cardiologist: Tonny Bollman, MD  Discharge Diagnoses    Principal Problem:   Pericarditis Active Problems:   Chest pain at rest   Allergies Allergies  Allergen Reactions   Augmentin [Amoxicillin-Pot Clavulanate] Swelling and Rash    Tongue swells     Diagnostic Studies/Procedures    Cath: 06/05/19   1.  Patent coronary arteries with minimal nonobstructive coronary artery disease, no evidence of a culprit lesion for STEMI. 2.  Hyperdynamic/normal LV systolic function with LVEF greater than 65% and normal LVEDP   Suspect noncardiac chest pain.  We will cycle high-sensitivity troponins, check an echocardiogram, and if symptoms resolve and study results are reassuring, likely discharge home tomorrow.     TTE: 06/06/19   IMPRESSIONS      1. Left ventricular ejection fraction, by visual estimation, is 50 to 55%. The left ventricle has normal function. There is no left ventricular hypertrophy.  2. The left ventricle demonstrates regional wall motion abnormalities.  3. LVEF is normal with inferoseptal hypokinesis.  4. Global right ventricle has  mildly reduced systolic function.The right ventricular size is mildly enlarged. No increase in right ventricular wall thickness.  5. Left atrial size was normal.  6. Right atrial size was normal.  7. The mitral valve is normal in structure. No evidence of mitral valve regurgitation.  8. The tricuspid valve is normal in structure. Tricuspid valve regurgitation is trivial.  9. The aortic valve is normal in structure. Aortic valve regurgitation is not visualized. 10. The pulmonic valve was normal in structure. Pulmonic valve regurgitation is not visualized. 11. The inferior vena cava is dilated in size with <50% respiratory variability, suggesting right atrial pressure of 15 mmHg. _____________   History of Present Illness     Gwendolyn Edwards is a 43 year old woman with no history of cardiac disease.  She has been followed for hypertension and was recently evaluated by Dr. Diona Browner for a heart murmur which was demonstrated to be benign.  She has had no prodrome of any exertional chest pain or pressure.  The day of admission at 11:30 AM she developed severe substernal chest pain that began abruptly at rest.  Her pain has persisted and she was initially evaluated at [facility] emergency room where her EKG demonstrated findings suggestive of an anteroseptal STEMI.  A code STEMI was called and she was transported to [facility] for emergency cardiac catheterization by [facility].   She complained of 7/10 chest pain.  It was described as a pressure-like sensation radiating to the back.  There was associated diaphoresis and shortness of breath, no heart palpitations, nausea, or vomiting.  No past history of any similar chest pains.   The patient lives with  her 45-year-old daughter.  She works as a [employer] manager.  She is a lifelong non-smoker.  She has no family history of coronary artery disease.  She has no personal history of diabetes or hyperlipidemia.  Hospital Course     1. Chest pain with abnormal  EKG: taken to the cath lab but found to have minimal nonobstructive CAD and no culprit lesion for STEMI changes. Hst were negative. CT angio of chest was negative for dissection. Ddimer negative. Sed Rate was elevated. Now treating for pericarditis with some improvement. Echo with hypokinesis in the inferoseptal wall concerning for possible myocarditis.  - she was started on colchicine 0.6mg  BID, added Ibuprofen 600mg  TID with significant improvement in her symptoms. Able to ambulate without recurrent chest pain.    2. HTN: controlled with current therapy of losartan, amlodipine and metoprolol.    3. Hypokalemia: corrected with supplement   4. NSVT: 20 beat run on telemetry, asymptomatic. She had been refusing BB as stated her PCP had stopped, and switched to losartan. Reviewed indications for therapy, and she was agreeable to take.  -- started on metoprolol 25mg  daily. Can titrate as an outpatient as needed.   5. Right calf pain: doppler negative for DVT.    Gwendolyn Edwards was seen by Dr. Tamala Julian and determined stable for discharge home. Follow up in the office has been arranged. Medications are listed below.   _____________  Discharge Vitals Blood pressure 109/70, pulse 62, temperature 97.9 F (36.6 C), temperature source Oral, resp. rate 20, height 5\' 3"  (1.6 m), weight 118 kg, last menstrual period 05/22/2019, SpO2 99 %.  Filed Weights   06/06/19 0514 06/07/19 0426 06/08/19 0316  Weight: 116.3 kg 116.6 kg 118 kg    Labs & Radiologic Studies    CBC Recent Labs    06/05/19 1526 06/06/19 0942  WBC 8.3 4.3  HGB 12.9 13.2  HCT 38.4 41.0  MCV 79.7* 82.7  PLT 282 253   Basic Metabolic Panel Recent Labs    06/06/19 0942 06/07/19 1023  NA 137 136  K 3.4* 3.6  CL 100 102  CO2 27 24  GLUCOSE 156* 88  BUN 10 11  CREATININE 0.89 0.82  CALCIUM 8.9 9.0  MG 2.0  --    Liver Function Tests No results for input(s): AST, ALT, ALKPHOS, BILITOT, PROT, ALBUMIN in the last  72 hours. No results for input(s): LIPASE, AMYLASE in the last 72 hours. Cardiac Enzymes No results for input(s): CKTOTAL, CKMB, CKMBINDEX, TROPONINI in the last 72 hours. BNP Invalid input(s): POCBNP D-Dimer Recent Labs    06/06/19 0942  DDIMER <0.27   Hemoglobin A1C No results for input(s): HGBA1C in the last 72 hours. Fasting Lipid Panel No results for input(s): CHOL, HDL, LDLCALC, TRIG, CHOLHDL, LDLDIRECT in the last 72 hours. Thyroid Function Tests No results for input(s): TSH, T4TOTAL, T3FREE, THYROIDAB in the last 72 hours.  Invalid input(s): FREET3 _____________  CARDIAC CATHETERIZATION  Result Date: 06/05/2019 1.  Patent coronary arteries with minimal nonobstructive coronary artery disease, no evidence of a culprit lesion for STEMI. 2.  Hyperdynamic/normal LV systolic function with LVEF greater than 65% and normal LVEDP Suspect noncardiac chest pain.  We will cycle high-sensitivity troponins, check an echocardiogram, and if symptoms resolve and study results are reassuring, likely discharge home tomorrow.  CXR Portable same day  Result Date: 06/05/2019 CLINICAL DATA:  Inpatient encounter for chest pain at rest EXAM: PORTABLE CHEST 1 VIEW COMPARISON:  Chest radiograph 03/05/2018 FINDINGS: Stable cardiomediastinal  contours. Heart size is upper limits of normal which may be secondary to AP technique. The lungs are clear. No pneumothorax or large pleural effusion. No acute finding in the visualized skeleton. IMPRESSION: No acute cardiopulmonary process. Electronically Signed   By: Emmaline Kluver M.D.   On: 06/05/2019 19:03   CT ANGIO CHEST AORTA W/CM & OR WO/CM  Result Date: 06/06/2019 CLINICAL DATA:  Chest and back pain.  Aortic dissection suspected. EXAM: CT ANGIOGRAPHY CHEST WITH CONTRAST TECHNIQUE: Multidetector CT imaging of the chest was performed using the standard protocol during bolus administration of intravenous contrast. Multiplanar CT image reconstructions and  MIPs were obtained to evaluate the vascular anatomy. CONTRAST:  75mL OMNIPAQUE IOHEXOL 350 MG/ML SOLN COMPARISON:  Chest radiograph 1 day prior. CTA of the chest 03/06/2018. FINDINGS: Cardiovascular: Normal aortic and great vessel caliber, without dissection or aneurysm. Borderline cardiomegaly, without pericardial effusion. No central pulmonary embolism, on this non-dedicated study. Mediastinum/Nodes: No mediastinal or hilar adenopathy. Lungs/Pleura: No pleural fluid. 3 mm posterior right upper lobe pulmonary nodule on 50/7 is similar to the 2019 exam and can be presumed benign. Right hemidiaphragm elevation. Upper Abdomen: Probable hepatic steatosis. Normal imaged portions of the spleen, stomach, pancreas, adrenal glands, kidneys. Musculoskeletal: Mid and lower thoracic spondylosis. Review of the MIP images confirms the above findings. IMPRESSION: No acute process in the chest. No evidence of aortic aneurysm or dissection. Electronically Signed   By: Jeronimo Greaves M.D.   On: 06/06/2019 17:21   ECHOCARDIOGRAM COMPLETE  Result Date: 06/06/2019   ECHOCARDIOGRAM REPORT   Patient Name:   Gwendolyn Edwards Date of Exam: 06/06/2019 Medical Rec #:  [id number]               Height:       63.0 in Accession #:    [id number]              Weight:       256.3 lb Date of Birth:  06-Jan-1976               BSA:          2.15 m Patient Age:    43 years                BP:           136/68 mmHg Patient Gender: F                       HR:           63 bpm. Exam Location:  Inpatient Procedure: 2D Echo Indications:    chest pain 786.50  History:        Patient has prior history of Echocardiogram examinations, most                 recent 03/21/2019. Risk Factors:Hypertension.  Sonographer:    Celene Skeen RDCS (AE) Referring Phys: [id number] MICHAEL COOPER  Sonographer Comments: Image acquisition challenging due to patient body habitus. IMPRESSIONS  1. Left ventricular ejection fraction, by visual estimation, is 50 to 55%. The left  ventricle has normal function. There is no left ventricular hypertrophy.  2. The left ventricle demonstrates regional wall motion abnormalities.  3. LVEF is normal with inferoseptal hypokinesis.  4. Global right ventricle has mildly reduced systolic function.The right ventricular size is mildly enlarged. No increase in right ventricular wall thickness.  5. Left atrial size was normal.  6. Right atrial size was normal.  7. The mitral  valve is normal in structure. No evidence of mitral valve regurgitation.  8. The tricuspid valve is normal in structure. Tricuspid valve regurgitation is trivial.  9. The aortic valve is normal in structure. Aortic valve regurgitation is not visualized. 10. The pulmonic valve was normal in structure. Pulmonic valve regurgitation is not visualized. 11. The inferior vena cava is dilated in size with <50% respiratory variability, suggesting right atrial pressure of 15 mmHg. FINDINGS  Left Ventricle: Left ventricular ejection fraction, by visual estimation, is 50 to 55%. The left ventricle has normal function. The left ventricle demonstrates regional wall motion abnormalities. There is no left ventricular hypertrophy. Left ventricular diastolic parameters were normal. LVEF is normal with inferoseptal hypokinesis. Right Ventricle: The right ventricular size is mildly enlarged. No increase in right ventricular wall thickness. Global RV systolic function is has mildly reduced systolic function. Left Atrium: Left atrial size was normal in size. Right Atrium: Right atrial size was normal in size Pericardium: There is no evidence of pericardial effusion. Mitral Valve: The mitral valve is normal in structure. No evidence of mitral valve regurgitation. Tricuspid Valve: The tricuspid valve is normal in structure. Tricuspid valve regurgitation is trivial. Aortic Valve: The aortic valve is normal in structure. Aortic valve regurgitation is not visualized. Pulmonic Valve: The pulmonic valve was normal  in structure. Pulmonic valve regurgitation is not visualized. Pulmonic regurgitation is not visualized. Aorta: The aortic root is normal in size and structure. Venous: The inferior vena cava is dilated in size with less than 50% respiratory variability, suggesting right atrial pressure of 15 mmHg. IAS/Shunts: No atrial level shunt detected by color flow Doppler.  LEFT VENTRICLE PLAX 2D LVIDd:         4.30 cm  Diastology LVIDs:         2.80 cm  LV e' lateral:   13.30 cm/s LV PW:         1.14 cm  LV E/e' lateral: 5.4 LV IVS:        0.89 cm  LV e' medial:    8.27 cm/s LVOT diam:     2.20 cm  LV E/e' medial:  8.7 LV SV:         54 ml LV SV Index:   22.86 LVOT Area:     3.80 cm  RIGHT VENTRICLE RV S prime:     15.00 cm/s TAPSE (M-mode): 2.2 cm LEFT ATRIUM             Index       RIGHT ATRIUM           Index LA diam:        3.20 cm 1.49 cm/m  RA Area:     13.20 cm LA Vol (A2C):   38.9 ml 18.10 ml/m RA Volume:   32.70 ml  15.22 ml/m LA Vol (A4C):   40.3 ml 18.76 ml/m LA Biplane Vol: 41.5 ml 19.31 ml/m  AORTIC VALVE LVOT Vmax:   106.00 cm/s LVOT Vmean:  67.700 cm/s LVOT VTI:    0.190 m  AORTA Ao Root diam: 2.90 cm MITRAL VALVE MV Area (PHT): 3.46 cm             SHUNTS MV PHT:        63.51 msec           Systemic VTI:  0.19 m MV Decel Time: 219 msec             Systemic Diam: 2.20 cm MV E velocity: 71.60 cm/s  103 cm/s MV A velocity: 53.10 cm/s 70.3 cm/s MV E/A ratio:  1.35       1.5  Dietrich Pates MD Electronically signed by Dietrich Pates MD Signature Date/Time: 06/06/2019/2:54:49 PM    Final    VAS [place] LOWER EXTREMITY VENOUS (DVT)  Result Date: 06/08/2019  Lower Venous Study Indications: Pain, and Edema.  Comparison Study: no prior Performing Technologist: Blanch Media RVS  Examination Guidelines: A complete evaluation includes B-mode imaging, spectral Doppler, color Doppler, and power Doppler as needed of all accessible portions of each vessel. Bilateral testing is considered an integral part of a complete  examination. Limited examinations for reoccurring indications may be performed as noted.  +---------+---------------+---------+-----------+----------+--------------+ RIGHT    CompressibilityPhasicitySpontaneityPropertiesThrombus Aging +---------+---------------+---------+-----------+----------+--------------+ CFV      Full           Yes      Yes                                 +---------+---------------+---------+-----------+----------+--------------+ SFJ      Full                                                        +---------+---------------+---------+-----------+----------+--------------+ FV Prox  Full                                                        +---------+---------------+---------+-----------+----------+--------------+ FV Mid   Full                                                        +---------+---------------+---------+-----------+----------+--------------+ FV DistalFull                                                        +---------+---------------+---------+-----------+----------+--------------+ PFV      Full                                                        +---------+---------------+---------+-----------+----------+--------------+ POP      Full           Yes      Yes                                 +---------+---------------+---------+-----------+----------+--------------+ PTV      Full                                                        +---------+---------------+---------+-----------+----------+--------------+  PERO                                                  Not visualized +---------+---------------+---------+-----------+----------+--------------+   +----+---------------+---------+-----------+----------+--------------+ LEFTCompressibilityPhasicitySpontaneityPropertiesThrombus Aging +----+---------------+---------+-----------+----------+--------------+ CFV                                               Not visualized +----+---------------+---------+-----------+----------+--------------+     Summary: Right: There is no evidence of deep vein thrombosis in the lower extremity. No cystic structure found in the popliteal fossa.  *See table(s) above for measurements and observations. Electronically signed by Sherald Hesshristopher Clark MD on 06/08/2019 at 12:49:19 PM.    Final    Disposition   Pt is being discharged home today in good condition.  Follow-up Plans & Appointments    Follow-up Information     Berton BonHammond, Janine, NP Follow up on 07/01/2019.   Specialty: Cardiology Why: at 9:30am for your follow up appt. Contact information: [address] [place] [address] [phone]           Discharge Instructions     Diet - low sodium heart healthy   Complete by: As directed    Increase activity slowly   Complete by: As directed         Discharge Medications     Medication List     TAKE these medications    amLODipine 5 MG tablet Commonly known as: NORVASC Take 5 mg by mouth daily.   colchicine 0.6 MG tablet Take 1 tablet (0.6 mg total) by mouth 2 (two) times daily.   famotidine 20 MG tablet Commonly known as: PEPCID Take 20 mg by mouth daily as needed for heartburn.   ibuprofen 600 MG tablet Commonly known as: ADVIL Take 1 tablet (600 mg total) by mouth 3 (three) times daily.   losartan-hydrochlorothiazide 100-25 MG tablet Commonly known as: HYZAAR Take 1 tablet by mouth daily.   metoprolol succinate 25 MG 24 hr tablet Commonly known as: TOPROL-XL Take 1 tablet (25 mg total) by mouth daily. Start taking on: June 09, 2019 What changed:  medication strength how much to take additional instructions   venlafaxine XR 37.5 MG 24 hr capsule Commonly known as: EFFEXOR-XR Take 37.5 mg by mouth daily with breakfast.        No                               Did the patient have a percutaneous coronary intervention (stent / angioplasty)?:  No.       Outstanding Labs/Studies   N/a   Duration of Discharge Encounter   Greater than 30 minutes including physician time.  Signed, Laverda PageLindsay Roberts NP-C 06/08/2019, 1:20 PM

## 2019-06-14 ENCOUNTER — Other Ambulatory Visit: Payer: Self-pay

## 2019-06-14 ENCOUNTER — Encounter (HOSPITAL_COMMUNITY): Payer: Self-pay | Admitting: Emergency Medicine

## 2019-06-14 ENCOUNTER — Emergency Department (HOSPITAL_COMMUNITY)
Admission: EM | Admit: 2019-06-14 | Discharge: 2019-06-14 | Disposition: A | Discharge status: Home / Self Care | Payer: BC Managed Care – PPO | Attending: Emergency Medicine | Admitting: Emergency Medicine

## 2019-06-14 DIAGNOSIS — R45851 Suicidal ideations: Secondary | ICD-10-CM | POA: Diagnosis not present

## 2019-06-14 DIAGNOSIS — F329 Major depressive disorder, single episode, unspecified: Secondary | ICD-10-CM | POA: Diagnosis not present

## 2019-06-14 DIAGNOSIS — I119 Hypertensive heart disease without heart failure: Secondary | ICD-10-CM | POA: Insufficient documentation

## 2019-06-14 DIAGNOSIS — Z008 Encounter for other general examination: Secondary | ICD-10-CM | POA: Diagnosis present

## 2019-06-14 DIAGNOSIS — F32A Depression, unspecified: Secondary | ICD-10-CM

## 2019-06-14 LAB — RAPID URINE DRUG SCREEN, HOSP PERFORMED
Amphetamines: NOT DETECTED
Barbiturates: NOT DETECTED
Benzodiazepines: NOT DETECTED
Cocaine: NOT DETECTED
Opiates: NOT DETECTED
Tetrahydrocannabinol: NOT DETECTED

## 2019-06-14 LAB — CBC
HCT: 39.6 % (ref 36.0–46.0)
Hemoglobin: 12.8 g/dL (ref 12.0–15.0)
MCH: 26.8 pg (ref 26.0–34.0)
MCHC: 32.3 g/dL (ref 30.0–36.0)
MCV: 83 fL (ref 80.0–100.0)
Platelets: 333 10*3/uL (ref 150–400)
RBC: 4.77 MIL/uL (ref 3.87–5.11)
RDW: 15.3 % (ref 11.5–15.5)
WBC: 7.5 10*3/uL (ref 4.0–10.5)
nRBC: 0 % (ref 0.0–0.2)

## 2019-06-14 LAB — COMPREHENSIVE METABOLIC PANEL
ALT: 47 U/L — ABNORMAL HIGH (ref 0–44)
AST: 22 U/L (ref 15–41)
Albumin: 4.1 g/dL (ref 3.5–5.0)
Alkaline Phosphatase: 79 U/L (ref 38–126)
Anion gap: 12 (ref 5–15)
BUN: 10 mg/dL (ref 6–20)
CO2: 22 mmol/L (ref 22–32)
Calcium: 9.1 mg/dL (ref 8.9–10.3)
Chloride: 103 mmol/L (ref 98–111)
Creatinine, Ser: 0.84 mg/dL (ref 0.44–1.00)
GFR calc Af Amer: >60 mL/min (ref >60)
GFR calc non Af Amer: >60 mL/min (ref >60)
Glucose, Bld: 119 mg/dL — ABNORMAL HIGH (ref 70–99)
Potassium: 3.2 mmol/L — ABNORMAL LOW (ref 3.5–5.1)
Sodium: 137 mmol/L (ref 135–145)
Total Bilirubin: 0.6 mg/dL (ref 0.3–1.2)
Total Protein: 7.6 g/dL (ref 6.5–8.1)

## 2019-06-14 LAB — SALICYLATE LEVEL: Salicylate Lvl: <7 mg/dL — ABNORMAL LOW (ref 7.0–30.0)

## 2019-06-14 LAB — ETHANOL: Alcohol, Ethyl (B): <10 mg/dL (ref <10)

## 2019-06-14 LAB — ACETAMINOPHEN LEVEL: Acetaminophen (Tylenol), Serum: <10 ug/mL — ABNORMAL LOW (ref 10–30)

## 2019-06-14 LAB — PREGNANCY, URINE: Preg Test, Ur: NEGATIVE

## 2019-06-14 NOTE — ED Provider Notes (Addendum)
[facility]-EMERGENCY DEPT [facility] Emergency Department Provider Note MRN:  [id number]  Arrival date & time: 06/14/19     Chief Complaint   Suicidal ideation History of Present Illness   Gwendolyn Edwards is a 43 y.o. year-old female with a history of hypertension presenting to the ED with chief complaint of suicidal ideation.  Patient explains that her mother died near Christmas several years ago and so she is normally mildly depressed during the holidays.  Her husband is in jail and she has very little support system.  A few weeks ago she came to the emergency department with chest pain and was rushed to the neighboring hospital with concern for STEMI.  She has been having issues with mood ever since.  This evening, after putting her daughter to bed, she thought seriously about overdosing on her prescribed medications.  Came here instead.  Review of Systems  A complete 10 system review of systems was obtained and all systems are negative except as noted in the HPI and PMH.   Patient's Health History    Past Medical History:  Diagnosis Date  . Acid reflux   . Anxiety   . Essential hypertension   . Hypertensive heart disease 12/01/2018    Past Surgical History:  Procedure Laterality Date  . LEFT HEART CATH AND CORONARY ANGIOGRAPHY N/A 06/05/2019   Procedure: LEFT HEART CATH AND CORONARY ANGIOGRAPHY;  Surgeon: Tonny Bollmanooper, Masahiro Iglesia, MD;  [facility];  Service: Cardiovascular;  Laterality: N/A;  . WISDOM TOOTH EXTRACTION      Family History  Problem Relation Age of Onset  . Kidney disease Mother   . Hepatitis C Mother   . Hypertension Mother   . Cancer Father        Osteosarcoma   . Thyroid disease Sister     Social History   Socioeconomic History  . Marital status: Married    Spouse name: Not on file  . Number of children: Not on file  . Years of education: Not on file  . Highest education level: Not on file  Occupational History  . Not on file    Tobacco Use  . Smoking status: Never Smoker  . Smokeless tobacco: Never Used  Substance and Sexual Activity  . Alcohol use: No  . Drug use: No  . Sexual activity: Not on file  Other Topics Concern  . Not on file  Social History Narrative  . Not on file   Social Determinants of Health   Financial Resource Strain:   . Difficulty of Paying Living Expenses: Not on file  Food Insecurity:   . Worried About Programme researcher, broadcasting/film/videounning Out of Food in the Last Year: Not on file  . Ran Out of Food in the Last Year: Not on file  Transportation Needs:   . Lack of Transportation (Medical): Not on file  . Lack of Transportation (Non-Medical): Not on file  Physical Activity:   . Days of Exercise per Week: Not on file  . Minutes of Exercise per Session: Not on file  Stress:   . Feeling of Stress : Not on file  Social Connections:   . Frequency of Communication with Friends and Family: Not on file  . Frequency of Social Gatherings with Friends and Family: Not on file  . Attends Religious Services: Not on file  . Active Member of Clubs or Organizations: Not on file  . Attends BankerClub or Organization Meetings: Not on file  . Marital Status: Not on file  Intimate  Partner Violence:   . Fear of Current or Ex-Partner: Not on file  . Emotionally Abused: Not on file  . Physically Abused: Not on file  . Sexually Abused: Not on file     Physical Exam  Vital Signs and Nursing Notes reviewed Vitals:   06/14/19 2041  BP: (!) 159/86  Pulse: 97  Resp: 18  Temp: 98.7 F (37.1 C)  SpO2: 97%    CONSTITUTIONAL: Well-appearing, NAD NEURO:  Alert and oriented x 3, no focal deficits EYES:  eyes equal and reactive ENT/NECK:  no LAD, no JVD CARDIO: Regular rate, well-perfused, normal S1 and S2 PULM:  CTAB no wheezing or rhonchi GI/GU:  normal bowel sounds, non-distended, non-tender MSK/SPINE:  No gross deformities, no edema SKIN:  no rash, atraumatic PSYCH:  Appropriate speech and behavior  Diagnostic and  Interventional Summary    EKG Interpretation  Date/Time:    Ventricular Rate:    PR Interval:    QRS Duration:   QT Interval:    QTC Calculation:   R Axis:     Text Interpretation:        Labs Reviewed  COMPREHENSIVE METABOLIC PANEL - Abnormal; Notable for the following components:      Result Value   Potassium 3.2 (*)    Glucose, Bld 119 (*)    ALT 47 (*)    All other components within normal limits  SALICYLATE LEVEL - Abnormal; Notable for the following components:   Salicylate Lvl <7.0 (*)    All other components within normal limits  ACETAMINOPHEN LEVEL - Abnormal; Notable for the following components:   Acetaminophen (Tylenol), Serum <10 (*)    All other components within normal limits  ETHANOL  CBC  RAPID URINE DRUG SCREEN, HOSP PERFORMED  PREGNANCY, URINE    No orders to display    Medications - No data to display   Procedures  /  Critical Care Procedures  ED Course and Medical Decision Making  I have reviewed the triage vital signs and the nursing notes.  Pertinent labs & imaging results that were available during my care of the patient were reviewed by me and considered in my medical decision making (see below for details).     Concern for major depressive disorder with suicidal ideation, will consult TTS.  No toxidrome features on exam currently.  11 PM update: Patient is requesting to leave, she does not want to stay for TTS evaluation.  I had a long conversation with the patient, who further explains that her suicidal thoughts were very brief and she no longer has these thoughts.  This is never happened to her before, she knows that she needs outpatient help and she intends to follow-up with her regular depression medication prescriber earlier this week.  She explains that she will be going home with her best friend who will watch over her through the night.  At this point I feel the patient is very low risk to herself or others, there is not indication to  keep her here against her will, and I was able to contract her for safety.  She agrees to return to the emergency department prior to acting on any self harming thoughts.  Elmer Sow. Pilar Plate, MD [facility] Emergency Medicine [facility] [email or web address]  Final Clinical Impressions(s) / ED Diagnoses     ICD-10-CM   1. Depression, unspecified depression type  F32.9   2. Suicidal thoughts  R45.851     ED Discharge Orders  None       Discharge Instructions Discussed with and Provided to Patient:     Discharge Instructions     You were evaluated in the Emergency Department and after careful evaluation, we did not find any emergent condition requiring admission or further testing in the hospital.  Your exam/testing today was overall reassuring.  As discussed, it is very important that you follow-up with your primary care doctor as well as a psychiatrist to continue to receive outpatient assistance.  Please return to the Emergency Department if you experience any worsening of your condition.  We encourage you to follow up with a primary care provider.  Thank you for allowing [place] to be a part of your care.        Maudie Flakes, MD 06/14/19 2241    Maudie Flakes, MD 06/14/19 [phone]

## 2019-06-14 NOTE — ED Notes (Signed)
Pt to be dc. Pt understands importance of followup

## 2019-06-14 NOTE — ED Notes (Signed)
Pt daughter Vivien Rota [phone]

## 2019-06-14 NOTE — ED Triage Notes (Signed)
Pt states she has been having si thoughts x 3 days. Pt states she feels worthless and her husband is incarcerated. Pt plans to take a bunch of pill when her daughter goes to sleep.

## 2019-06-14 NOTE — ED Notes (Signed)
EDP at bedside  

## 2019-06-14 NOTE — ED Notes (Signed)
Pt wanting to go home. Pt states she is tired and irritable. Pt informed of process for [facility] and awaiting telepsych assessment. Pt still wanting to leave. Dr Sedonia Small notified and to come speak to patient.

## 2019-06-14 NOTE — Discharge Instructions (Addendum)
You were evaluated in the Emergency Department and after careful evaluation, we did not find any emergent condition requiring admission or further testing in the hospital.  Your exam/testing today was overall reassuring.  As discussed, it is very important that you follow-up with your primary care doctor as well as a psychiatrist to continue to receive outpatient assistance.  Please return to the Emergency Department if you experience any worsening of your condition.  We encourage you to follow up with a primary care provider.  Thank you for allowing [place] to be a part of your care.

## 2019-06-22 ENCOUNTER — Encounter: Payer: Self-pay | Admitting: *Deleted

## 2019-06-22 DIAGNOSIS — I10 Essential (primary) hypertension: Secondary | ICD-10-CM | POA: Insufficient documentation

## 2019-06-22 DIAGNOSIS — K219 Gastro-esophageal reflux disease without esophagitis: Secondary | ICD-10-CM | POA: Insufficient documentation

## 2019-06-22 DIAGNOSIS — F419 Anxiety disorder, unspecified: Secondary | ICD-10-CM | POA: Insufficient documentation

## 2019-07-01 ENCOUNTER — Ambulatory Visit (INDEPENDENT_AMBULATORY_CARE_PROVIDER_SITE_OTHER): Payer: Self-pay | Admitting: Cardiology

## 2019-07-01 ENCOUNTER — Other Ambulatory Visit: Payer: Self-pay

## 2019-07-01 ENCOUNTER — Encounter: Payer: Self-pay | Admitting: Cardiology

## 2019-07-01 VITALS — BP 126/70 | HR 88 | Ht 63.0 in | Wt 261.4 lb

## 2019-07-01 DIAGNOSIS — R0601 Orthopnea: Secondary | ICD-10-CM

## 2019-07-01 DIAGNOSIS — I300 Acute nonspecific idiopathic pericarditis: Secondary | ICD-10-CM

## 2019-07-01 DIAGNOSIS — I10 Essential (primary) hypertension: Secondary | ICD-10-CM

## 2019-07-01 DIAGNOSIS — I472 Ventricular tachycardia: Secondary | ICD-10-CM

## 2019-07-01 DIAGNOSIS — I4729 Other ventricular tachycardia: Secondary | ICD-10-CM

## 2019-07-01 MED ORDER — METOPROLOL SUCCINATE ER 50 MG PO TB24: 50.0000 mg | ORAL_TABLET | Freq: Every day | ORAL | 3 refills | Status: DC

## 2019-07-01 NOTE — Progress Notes (Signed)
Cardiology Office Note:    Date:  07/01/2019   ID:  Gwendolyn Edwards, DOB 1976/05/17, MRN [id number]  PCP:  Glenda Chroman, MD  Cardiologist:  Rozann Lesches, MD  Referring MD: Glenda Chroman, MD   Chief Complaint  Patient presents with  . Hospitalization Follow-up    Pericarditis    History of Present Illness:    Gwendolyn Edwards is a 44 y.o. female with no past cardiac history, but has a history of hypertension.  She was recently evaluated by Dr. Domenic Polite for a heart murmur which was demonstrated to be benign.  On 06/05/2019 she developed substernal chest pain that began abruptly at rest.  She presented to [facility] where her EKG demonstrated findings suggestive of anterior STEMI.  She was transferred to [facility] for emergency cardiac catheterization.  The cath demonstrated only minimal nonobstructive CAD and no culprit lesion for STEMI changes.  High-sensitivity troponins were negative.  CT angio of the chest was negative for dissection.  D-dimer was negative.  Sed rate was elevated.  She was treated for pericarditis with some improvement.  Echocardiogram showed hypokinesis in the inferior septal wall concerning for possible myocarditis.  She had a 20 beat run of NSVT while in the hospital and was asymptomatic.  She had been refusing beta-blocker as an outpatient.  She was agreeable to resume metoprolol 25 mg daily.  Gwendolyn Edwards is here today for hospital follow-up. She is reporting a lot of fatigue since her hospitalization. She denies chest pain, but having a "nagging" dull pain across her upper back that she had with her event. No shortness of breath. She has occ quick flutter up to 6-7 times per day. She had these flutters prior to the hospital, but not as frequent. She has no associated symptoms with these and they don't bother her. She has to prop up on 3 pillows to sleep which was new with this episode. She occ gets up to a recliner during the night.  No edema. She has been getting a lot of cramping in her legs.    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  She has no family history of coronary artery disease and no personal history of diabetes or hyperlipidemia.  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 She has felt fatigued but is improving on a daily basis. She feels that she can go back to work in 2 weeks.   Cardiac studies   Echocardiogram 06/06/2019 IMPRESSIONS   1. Left ventricular ejection fraction, by visual estimation, is 50 to 55%. The left ventricle has normal function. There is no left ventricular hypertrophy.  2. The left ventricle demonstrates regional wall motion abnormalities.  3. LVEF is normal with inferoseptal hypokinesis.  4. Global right ventricle has mildly reduced systolic function.The right ventricular size is mildly enlarged. No increase in right ventricular wall thickness.  5. Left atrial size was normal.  6. Right atrial size was normal.  7. The mitral valve is normal in structure. No evidence of mitral valve regurgitation.  8. The tricuspid valve is normal in structure. Tricuspid valve regurgitation is trivial.  9. The aortic valve is normal in structure. Aortic valve regurgitation is not visualized. 10. The pulmonic valve was normal in structure. Pulmonic valve regurgitation is not visualized. 11. The inferior vena cava is dilated in size with <50% respiratory variability, suggesting right atrial pressure of 15 mmHg.  Past  Medical History:  Diagnosis Date  . Abscess of neck 02/08/2018   Last Assessment & Plan:  Counseling: We will schedule the patient for I&D of neck abscess under MAC-Local anesthesia today.  Risks, benefits and alternatives of the proposed surgery were discussed with the patient and these included but were not limited to bleeding, infection, scar tissue, chronic pain, injury to nearby structures such as nearby  vessels and nerves, bowel, conversion to open techni  . Acid reflux   . Anxiety   . Chest pain at rest 06/05/2019  . Essential hypertension   . Hypertensive heart disease 12/01/2018  . Pericarditis 06/08/2019    Past Surgical History:  Procedure Laterality Date  . LEFT HEART CATH AND CORONARY ANGIOGRAPHY N/A 06/05/2019   Procedure: LEFT HEART CATH AND CORONARY ANGIOGRAPHY;  Surgeon: Sherren Mocha, MD;  Location: [facility] CV LAB;  Service: Cardiovascular;  Laterality: N/A;  . WISDOM TOOTH EXTRACTION      Current Medications: Current Meds  Medication Sig  . amLODipine (NORVASC) 5 MG tablet Take 5 mg by mouth daily.  . colchicine 0.6 MG tablet Take 1 tablet (0.6 mg total) by mouth 2 (two) times daily.  . famotidine (PEPCID) 20 MG tablet Take 20 mg by mouth daily.   Marland Kitchen ibuprofen (ADVIL) 600 MG tablet Take 1 tablet (600 mg total) by mouth 3 (three) times daily.  Marland Kitchen losartan-hydrochlorothiazide (HYZAAR) 100-25 MG tablet Take 1 tablet by mouth daily.  . metoprolol succinate (TOPROL-XL) 25 MG 24 hr tablet Take 1 tablet (25 mg total) by mouth daily.  Marland Kitchen venlafaxine XR (EFFEXOR-XR) 75 MG 24 hr capsule Take 75 mg by mouth daily with breakfast.     Allergies:   Augmentin [amoxicillin-pot clavulanate]   Social History   Socioeconomic History  . Marital status: Married    Spouse name: Not on file  . Number of children: Not on file  . Years of education: Not on file  . Highest education level: Not on file  Occupational History  . Not on file  Tobacco Use  . Smoking status: Never Smoker  . Smokeless tobacco: Never Used  Substance and Sexual Activity  . Alcohol use: No  . Drug use: No  . Sexual activity: Not on file  Other Topics Concern  . Not on file  Social History Narrative  . Not on file   Social Determinants of Health   Financial Resource Strain:   . Difficulty of Paying Living Expenses: Not on file  Food Insecurity:   . Worried About Charity fundraiser in the Last  Year: Not on file  . Ran Out of Food in the Last Year: Not on file  Transportation Needs:   . Lack of Transportation (Medical): Not on file  . Lack of Transportation (Non-Medical): Not on file  Physical Activity:   . Days of Exercise per Week: Not on file  . Minutes of Exercise per Session: Not on file  Stress:   . Feeling of Stress : Not on file  Social Connections:   . Frequency of Communication with Friends and Family: Not on file  . Frequency of Social Gatherings with Friends and Family: Not on file  . Attends Religious Services: Not on file  . Active Member of Clubs or Organizations: Not on file  . Attends Archivist Meetings: Not on file  . Marital Status: Not on file     Family History: The patient's family history includes Cancer in her father; Hepatitis C in her  mother; Hypertension in her mother; Kidney disease in her mother; Thyroid disease in her sister. ROS:   Please see the history of present illness.     All other systems reviewed and are negative.   EKG:  EKG is not ordered today.  Recent Labs: 06/06/2019: Magnesium 2.0 06/14/2019: ALT 47; BUN 10; Creatinine, Ser 0.84; Hemoglobin 12.8; Platelets 333; Potassium 3.2; Sodium 137   Recent Lipid Panel    Component Value Date/Time   CHOL 182 06/05/2019 1303   TRIG 43 06/05/2019 1303   HDL 50 06/05/2019 1303   CHOLHDL 3.6 06/05/2019 1303   VLDL 9 06/05/2019 1303   LDLCALC 123 (H) 06/05/2019 1303    Physical Exam:    VS:  BP 126/70   Pulse 88   Ht _0  (1.6 m)   Wt 261 lb 6.4 oz (118.6 kg)   SpO2 99%   BMI 46.30 kg/m     Wt Readings from Last 6 Encounters:  07/01/19 261 lb 6.4 oz (118.6 kg)  06/14/19 260 lb (117.9 kg)  06/08/19 260 lb 1.6 oz (118 kg)  05/26/19 265 lb (120.2 kg)  12/01/18 285 lb (129.3 kg)  03/05/18 280 lb (127 kg)     Physical Exam  Constitutional: She is oriented to person, place, and time. She appears well-developed and well-nourished. No distress.  HENT:  Head:  Normocephalic and atraumatic.  Neck: No JVD present.  Cardiovascular: Normal rate, regular rhythm, normal heart sounds and intact distal pulses. Exam reveals no gallop and no friction rub.  No murmur heard. Pulmonary/Chest: Effort normal and breath sounds normal. She has no wheezes. She has no rales.  Abdominal: Soft. Bowel sounds are normal.  Musculoskeletal:        General: No edema. Normal range of motion.     Cervical back: Normal range of motion and neck supple.  Neurological: She is alert and oriented to person, place, and time.  Skin: Skin is warm and dry.  Psychiatric: She has a normal mood and affect. Her behavior is normal. Judgment and thought content normal.  Vitals reviewed.   ASSESSMENT:    1. Acute idiopathic pericarditis   2. Essential hypertension   3. NSVT (nonsustained ventricular tachycardia) (HCC)    PLAN:    In order of problems listed above:  Pericarditis, possible myocarditis -Patient presented to the hospital with chest pain and had ST elevations.  Cardiac cath showed only minimal nonobstructive CAD.  Sed rate was elevated. -Echocardiogram with hypokinesis in the inferior septal wall concerning for possible myocarditis. -Patient was started on colchicine 0.6 mg twice daily and a course of ibuprofen 600 mg 3 times daily with significant improvement in symptoms. -Pt is slowly improving, still has some "nagging" dull ache across her upper back, no chest pain.  -She has 3 pillow orthopnea.  No dyspnea during the day or edema.  Possibly related to pericarditis. Will check BNP and provide a short course of lasix if elevated. She takes HCTZ for HTN. -I think she will be able to return to work on 07/15/2019.  We will provide a note for work.  Essential hypertension -Controlled with losartan, amlodipine and metoprolol. -BP well controlled.   NSVT -Patient had a 20 beat run while in the hospital but has been started on beta-blocker. -Pt has had brief flutters for  a couple of years, more frequent since the pericarditis. She is interested in increasing beta blocker to help control this. I advised her that this may exacerbate depression or fatigue.  She will give the medication 3 to 4 weeks to adjust. -Patient had hypokalemia at discharge.  I will check a metabolic panel today.  She does report some cramping in her calves.   Medication Adjustments/Labs and Tests Ordered: Current medicines are reviewed at length with the patient today.  Concerns regarding medicines are outlined above. Labs and tests ordered and medication changes are outlined in the patient instructions below:  Patient Instructions  Lifestyle Modifications to Prevent and Treat Heart Disease -Recommend heart healthy/Mediterranean diet, with whole grains, fruits, vegetables, fish, lean meats, nuts, olive oil and avocado oil.  -Limit salt intake to less than 2000 mg per day.  -Recommend moderate walking, starting slowly with a few minutes and working up to 3-5 times/week for 30-50 minutes each session. Aim for at least 150 minutes.week. Goal should be pace of 3 miles/hours, or walking 1.5 miles in 30 minutes -Recommend avoidance of tobacco products. Avoid excess alcohol. -Keep blood pressure well controlled, ideally less than 130/80.      Signed, Daune Perch, NP  07/01/2019 10:01 AM    [place]

## 2019-07-01 NOTE — Patient Instructions (Addendum)
Medication Instructions:   Your physician has recommended you make the following change in your medication:   1) Increase Metoprolol to 50MG , 1 tablet by mouth once a day  *If you need a refill on your cardiac medications before your next appointment, please call your pharmacy*  Lab Work:  You will have labs drawn today: BNP and BMET  If you have labs (blood work) drawn today and your tests are completely normal, you will receive your results only by: MyChart Message (if you have MyChart) OR . A paper copy in the mail If you have any lab test that is abnormal or we need to change your treatment, we will call you to review the results.  Testing/Procedures:  None ordered today  Follow-Up: At [facility], you and your health needs are our priority.  As part of our continuing mission to provide you with exceptional heart care, we have created designated Provider Care Teams.  These Care Teams include your primary Cardiologist (physician) and Advanced Practice Providers (APPs -  Physician Assistants and Nurse Practitioners) who all work together to provide you with the care you need, when you need it.  Your next appointment:   3 month(s)  The format for your next appointment:   In Person  Provider:   You may see [facility], MD or one of the following Advanced Practice Providers on your designated Care Team:    Nona Dell, PA-C   Randall An, PA-C      Lifestyle Modifications to Prevent and Treat Heart Disease -Recommend heart healthy/Mediterranean diet, with whole grains, fruits, vegetables, fish, lean meats, nuts, olive oil and avocado oil.  -Limit salt intake to less than 2000 mg per day.  -Recommend moderate walking, starting slowly with a few minutes and working up to 3-5 times/week for 30-50 minutes each session. Aim for at least 150 minutes.week. Goal should be pace of 3 miles/hours, or walking 1.5 miles in 30 minutes -Recommend avoidance of tobacco  products. Avoid excess alcohol. -Keep blood pressure well controlled, ideally less than 130/80.

## 2019-07-02 LAB — BASIC METABOLIC PANEL
BUN/Creatinine Ratio: 12 (ref 9–23)
BUN: 10 mg/dL (ref 6–24)
CO2: 24 mmol/L (ref 20–29)
Calcium: 9.1 mg/dL (ref 8.7–10.2)
Chloride: 101 mmol/L (ref 96–106)
Creatinine, Ser: 0.86 mg/dL (ref 0.57–1.00)
GFR calc Af Amer: 94 mL/min/{1.73_m2} (ref >59)
GFR calc non Af Amer: 82 mL/min/{1.73_m2} (ref >59)
Glucose: 113 mg/dL — ABNORMAL HIGH (ref 65–99)
Potassium: 3.7 mmol/L (ref 3.5–5.2)
Sodium: 139 mmol/L (ref 134–144)

## 2019-07-02 LAB — PRO B NATRIURETIC PEPTIDE: NT-Pro BNP: 47 pg/mL (ref 0–249)

## 2019-08-01 ENCOUNTER — Other Ambulatory Visit: Payer: Self-pay

## 2019-08-01 ENCOUNTER — Ambulatory Visit: Payer: Self-pay | Attending: Internal Medicine

## 2019-08-01 DIAGNOSIS — Z20822 Contact with and (suspected) exposure to covid-19: Secondary | ICD-10-CM | POA: Insufficient documentation

## 2019-08-02 ENCOUNTER — Telehealth: Payer: Self-pay | Admitting: *Deleted

## 2019-08-02 LAB — NOVEL CORONAVIRUS, NAA: SARS-CoV-2, NAA: NOT DETECTED

## 2019-08-02 NOTE — Telephone Encounter (Signed)
Pt calling for covid results, negative, verbalizes understanding. 

## 2019-09-28 ENCOUNTER — Ambulatory Visit: Payer: BC Managed Care – PPO | Admitting: Cardiology

## 2019-09-28 ENCOUNTER — Encounter: Payer: Self-pay | Admitting: Cardiology

## 2019-09-28 ENCOUNTER — Other Ambulatory Visit: Payer: Self-pay

## 2019-09-28 VITALS — BP 122/84 | HR 74 | Temp 98.3°F | Ht 63.0 in | Wt 257.0 lb

## 2019-09-28 DIAGNOSIS — Z8679 Personal history of other diseases of the circulatory system: Secondary | ICD-10-CM

## 2019-09-28 DIAGNOSIS — I10 Essential (primary) hypertension: Secondary | ICD-10-CM | POA: Diagnosis not present

## 2019-09-28 DIAGNOSIS — I472 Ventricular tachycardia: Secondary | ICD-10-CM

## 2019-09-28 DIAGNOSIS — I4729 Other ventricular tachycardia: Secondary | ICD-10-CM

## 2019-09-28 NOTE — Patient Instructions (Signed)
Medication Instructions:  STOP COLCHICINE   Labwork: NONE  Testing/Procedures: Your physician has requested that you have an echocardiogram. Echocardiography is a painless test that uses sound waves to create images of your heart. It provides your doctor with information about the size and shape of your heart and how well your heart's chambers and valves are working. This procedure takes approximately one hour. There are no restrictions for this procedure. (JUST BEFORE 6 MONTH VISIT)     Follow-Up: Your physician wants you to follow-up in: 6 MONTHS.  You will receive a reminder letter in the mail two months in advance. If you don't receive a letter, please call our office to schedule the follow-up appointment.   Any Other Special Instructions Will Be Listed Below (If Applicable).     If you need a refill on your cardiac medications before your next appointment, please call your pharmacy.

## 2019-09-28 NOTE — Progress Notes (Signed)
Cardiology Office Note  Date: 09/28/2019   ID: Gwendolyn Edwards, DOB Apr 06, 1976, MRN [id number]  PCP:  Glenda Chroman, MD  Cardiologist:  Rozann Lesches, MD Electrophysiologist:  None   Chief Complaint  Patient presents with  . Cardiac follow-up    History of Present Illness: Gwendolyn Edwards is a 44 y.o. female last seen in January by Ms. Phylliss Bob NP.  I reviewed interval records and updated the chart. She presents today for a follow-up visit, states that she has been doing well overall, back at work full-time.  We went over her medications.  She has been on colchicine for 3 months course and we discussed stopping this at this point.  Otherwise no changes to her baseline medications, blood pressure and heart rate are well controlled today.  I did review her echocardiogram report from December 2020.  We will plan a follow-up study around the time of her next visit.  Past Medical History:  Diagnosis Date  . Abscess of neck 2019  . Acid reflux   . Anxiety   . Essential hypertension   . Pericarditis    Possible pericarditis/myocarditis December 2020    Past Surgical History:  Procedure Laterality Date  . LEFT HEART CATH AND CORONARY ANGIOGRAPHY N/A 06/05/2019   Procedure: LEFT HEART CATH AND CORONARY ANGIOGRAPHY;  Surgeon: Sherren Mocha, MD;  Location: [facility];  Service: Cardiovascular;  Laterality: N/A;  . WISDOM TOOTH EXTRACTION      Current Outpatient Medications  Medication Sig Dispense Refill  . amLODipine (NORVASC) 5 MG tablet Take 5 mg by mouth daily.    . famotidine (PEPCID) 20 MG tablet Take 20 mg by mouth daily.     . furosemide (LASIX) 20 MG tablet TAKE 1 TABLET BY MOUTH TWICE DAILY FOR 2 DAYS THEN 1 TAB ONCE DAILY AS NEEDED    . losartan-hydrochlorothiazide (HYZAAR) 100-25 MG tablet Take 1 tablet by mouth daily.    . metoprolol succinate (TOPROL-XL) 50 MG 24 hr tablet Take 1 tablet (50 mg total) by mouth daily. 90 tablet 3  .  venlafaxine XR (EFFEXOR-XR) 75 MG 24 hr capsule Take 75 mg by mouth daily with breakfast.     No current facility-administered medications for this visit.   Allergies:  Augmentin [amoxicillin-pot clavulanate]   ROS:   No palpitations or syncope.  Physical Exam: VS:  BP 122/84   Pulse 74   Temp 98.3 F (36.8 C)   Ht 5\' 3"  (1.6 m)   Wt 257 lb (116.6 kg)   SpO2 96%   BMI 45.53 kg/m , BMI Body mass index is 45.53 kg/m.  Wt Readings from Last 3 Encounters:  09/28/19 257 lb (116.6 kg)  07/01/19 261 lb 6.4 oz (118.6 kg)  06/14/19 260 lb (117.9 kg)    General: Patient appears comfortable at rest. HEENT: Conjunctiva and lids normal, wearing a mask. Neck: Supple, no elevated JVP or carotid bruits, no thyromegaly. Lungs: Clear to auscultation, nonlabored breathing at rest. Cardiac: Regular rate and rhythm, no S3, soft significant systolic murmur, no pericardial rub. Abdomen: Soft, nontender, bowel sounds present. Extremities: No pitting edema, distal pulses 2+.  ECG:  An ECG dated 06/06/2019 was personally reviewed today and demonstrated:  Normal sinus rhythm with anteroseptal ST-T wave abnormalities.  Recent Labwork: 06/06/2019: Magnesium 2.0 06/14/2019: ALT 47; AST 22; Hemoglobin 12.8; Platelets 333 07/01/2019: BUN 10; Creatinine, Ser 0.86; NT-Pro BNP 47; Potassium 3.7; Sodium 139     Component Value Date/Time  CHOL 182 06/05/2019 1303   TRIG 43 06/05/2019 1303   HDL 50 06/05/2019 1303   CHOLHDL 3.6 06/05/2019 1303   VLDL 9 06/05/2019 1303   LDLCALC 123 (H) 06/05/2019 1303    Other Studies Reviewed Today:  Echocardiogram 06/06/2019: 1. Left ventricular ejection fraction, by visual estimation, is 50 to  55%. The left ventricle has normal function. There is no left ventricular  hypertrophy.  2. The left ventricle demonstrates regional wall motion abnormalities.  3. LVEF is normal with inferoseptal hypokinesis.  4. Global right ventricle has mildly reduced systolic  function.The right  ventricular size is mildly enlarged. No increase in right ventricular wall  thickness.  5. Left atrial size was normal.  6. Right atrial size was normal.  7. The mitral valve is normal in structure. No evidence of mitral valve  regurgitation.  8. The tricuspid valve is normal in structure. Tricuspid valve  regurgitation is trivial.  9. The aortic valve is normal in structure. Aortic valve regurgitation is  not visualized.  10. The pulmonic valve was normal in structure. Pulmonic valve  regurgitation is not visualized.  11. The inferior vena cava is dilated in size with <50% respiratory  variability, suggesting right atrial pressure of 15 mmHg.   Cardiac catheterization 06/05/2019: 1.  Patent coronary arteries with minimal nonobstructive coronary artery disease, no evidence of a culprit lesion for STEMI. 2.  Hyperdynamic/normal LV systolic function with LVEF greater than 65% and normal LVEDP  Assessment and Plan:  1.  History of pericarditis/myocarditis in December 2020, symptomatically improved.  Plan to stop colchicine at this point and observe.  We will obtain a follow-up echocardiogram around the time of her next visit in 6 months.  2.  Essential hypertension, blood pressure is well controlled today.  Continue Norvasc and Hyzaar.  3.  NSVT noted during evaluation of problem #1.  She does not describe any palpitations or syncope.  Continue observation on beta-blocker.  Medication Adjustments/Labs and Tests Ordered: Current medicines are reviewed at length with the patient today.  Concerns regarding medicines are outlined above.   Tests Ordered: Orders Placed This Encounter  Procedures  . ECHOCARDIOGRAM COMPLETE    Medication Changes: No orders of the defined types were placed in this encounter.   Disposition:  Follow up 6 months in the [place] office.  Signed, Jonelle Sidle, MD, [facility] 09/28/2019 9:00 AM    [facility]  [facility] at [facility] [address], [place], [place] [address] Phone: [phone]; Fax: [phone]

## 2019-10-12 IMAGING — CT CT ANGIO CHEST-ABD-PELV FOR DISSECTION W/ AND WO/W CM
2 of 7 series · 14 of 46 positions shown, 16 images · IV contrast (Isovue)
Comparison: Radiographs yesterday.

CLINICAL DATA: Acute onset of chest pain.

EXAM:
CT ANGIOGRAPHY CHEST, ABDOMEN AND PELVIS
TECHNIQUE: Multidetector CT imaging through the chest, abdomen and pelvis was
performed using the standard protocol during bolus administration of
intravenous contrast. Multiplanar reconstructed images and MIPs were
obtained and reviewed to evaluate the vascular anatomy.
CONTRAST:  100mL [id number] IOPAMIDOL (KR96WP-L2F) INJECTION 76%

[Series 5: dissection axial arterial · axial · arterial · 0.73mm/px · z∈[+1103,+1643]mm · 11 of 212 slices shown, 13 images]
[im 16/212  soft-tissue]
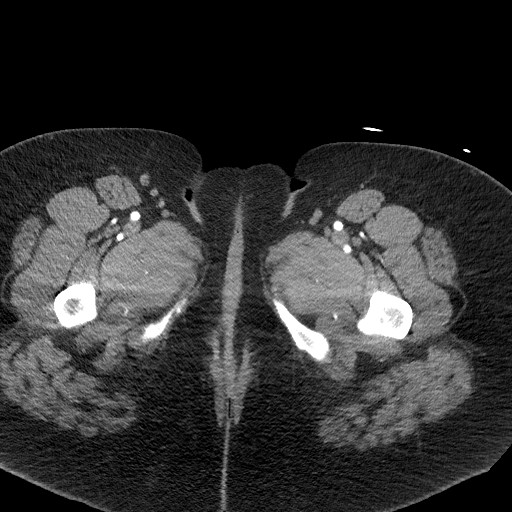
[im 16/212  bone]
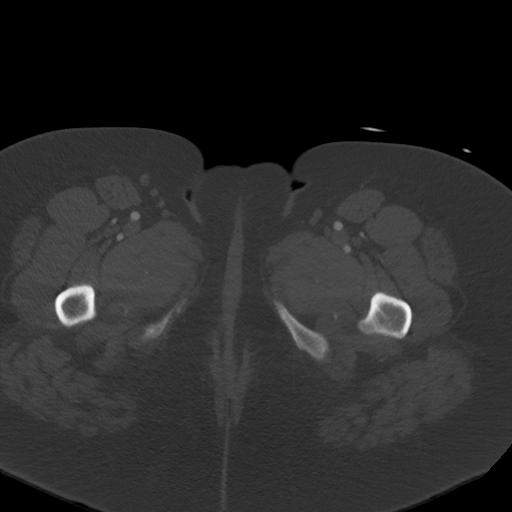
[im 31/212  soft-tissue]
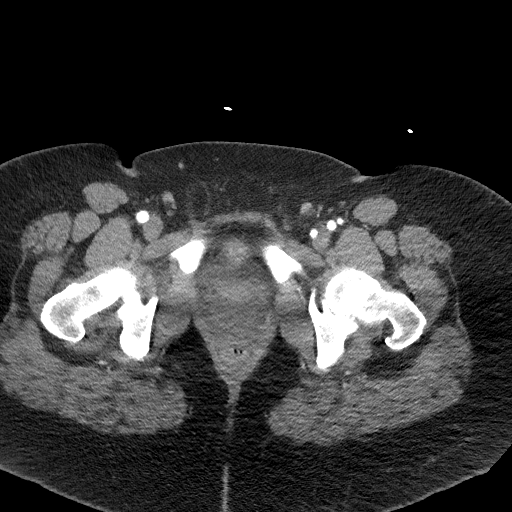
[im 46/212  soft-tissue]
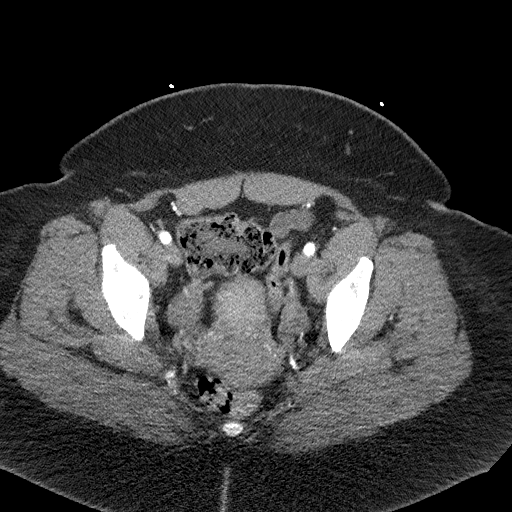
[im 76/212  soft-tissue]
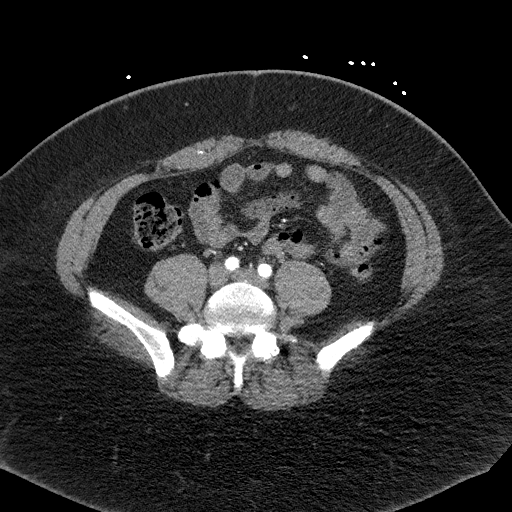
[im 91/212  soft-tissue]
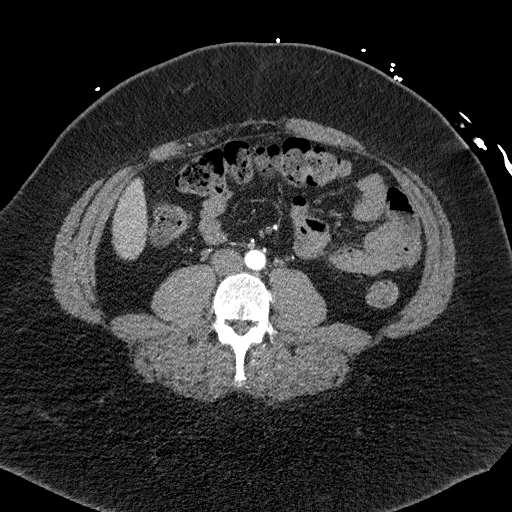
[im 106/212  soft-tissue]
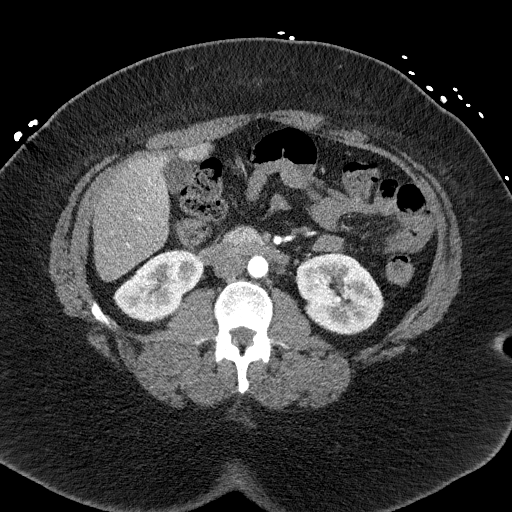
[im 121/212  soft-tissue]
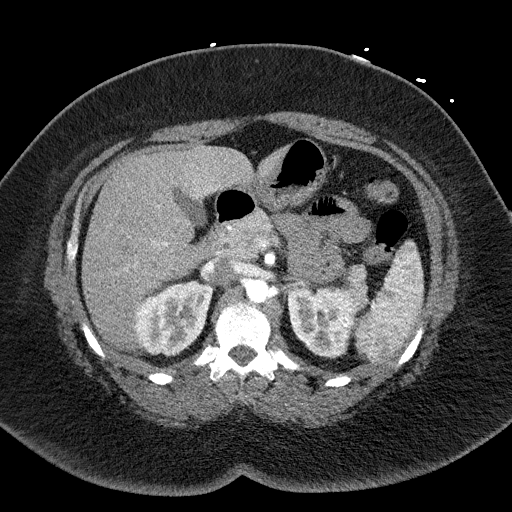
[im 136/212  soft-tissue]
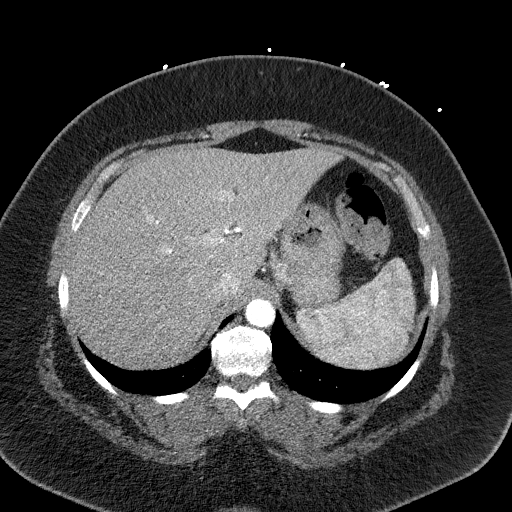
[im 166/212  soft-tissue]
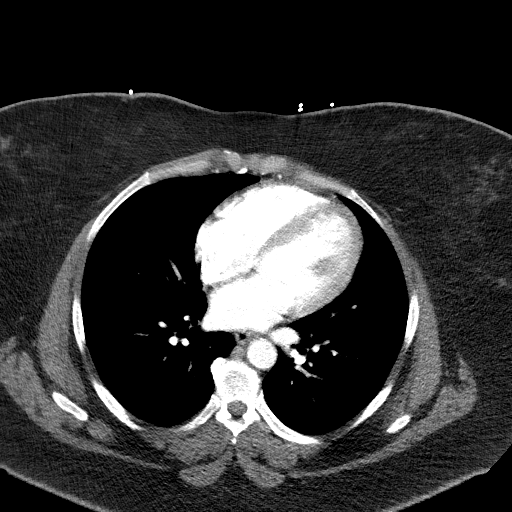
[im 166/212  bone]
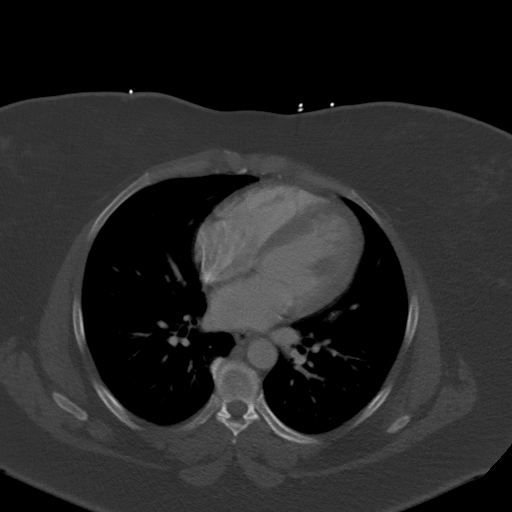
[im 181/212  soft-tissue]
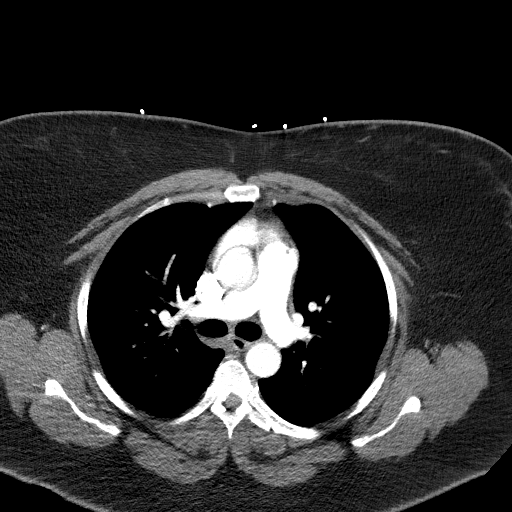
[im 196/212  soft-tissue]
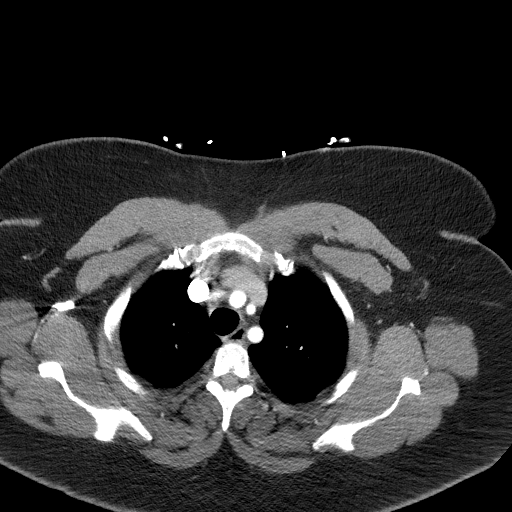

[Series 9: coronals · coronal · 0.79mm/px · 3 of 171 slices shown]
[im 43/171  soft-tissue]
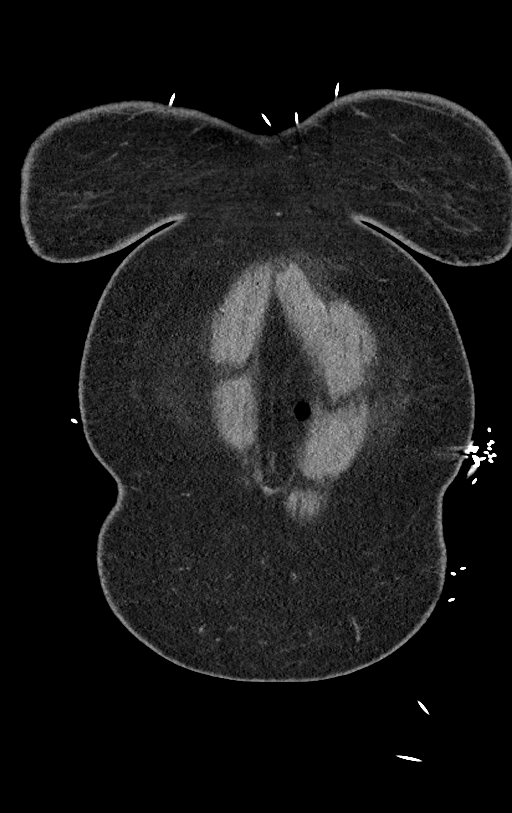
[im 86/171  soft-tissue]
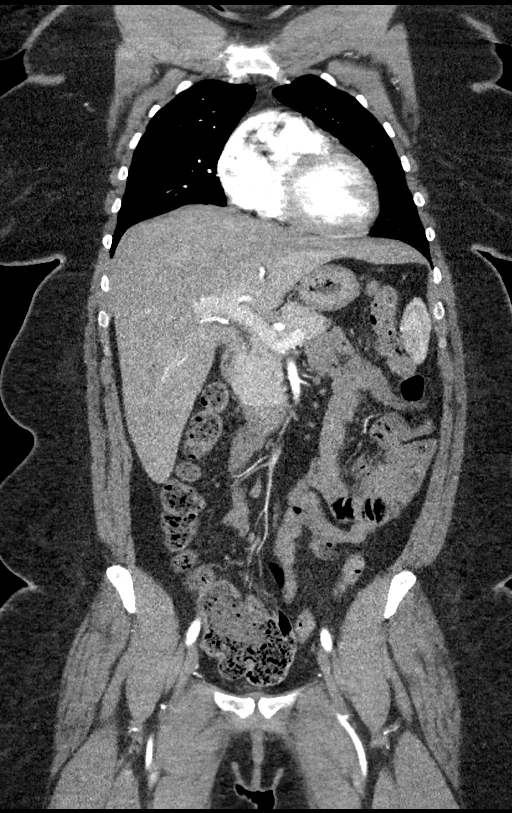
[im 128/171  soft-tissue]
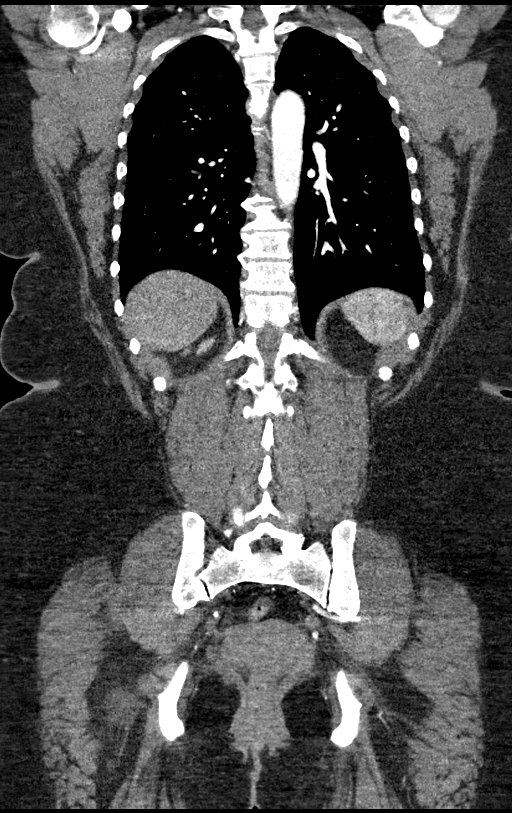

[14 of 46 positions shown; findings below may reference images not displayed]

FINDINGS: CTA CHEST FINDINGS

Cardiovascular: Normal caliber thoracic aorta without dissection,
aortic hematoma, aneurysm, acute aortic syndrome or significant
atherosclerosis. No filling defects in the central most pulmonary
arteries to suggest pulmonary embolus. Heart is normal in size. No
pericardial effusion.

Mediastinum/Nodes: No enlarged mediastinal or hilar lymph nodes. No
thyroid nodule. Esophagus is nondistended.

Lungs/Pleura: No confluent consolidation, pulmonary edema or pleural
fluid. Subsegmental linear atelectasis in the left lower lobe.

Musculoskeletal: Diffuse endplate spurring throughout thoracic
spine. There are no acute or suspicious osseous abnormalities.

Review of the MIP images confirms the above findings.

CTA ABDOMEN AND PELVIS FINDINGS

VASCULAR

Aorta: Normal caliber aorta without aneurysm, dissection, vasculitis
or significant stenosis.

Celiac: Patent without evidence of aneurysm, dissection, vasculitis
or significant stenosis.

SMA: Patent without evidence of aneurysm, dissection, vasculitis or
significant stenosis.

Renals: Both renal arteries are patent without evidence of aneurysm,
dissection, vasculitis, fibromuscular dysplasia or significant
stenosis.

IMA: Patent without evidence of aneurysm, dissection, vasculitis or
significant stenosis.

Inflow: Patent without evidence of aneurysm, dissection, vasculitis
or significant stenosis.

Veins: No obvious venous abnormality within the limitations of this
arterial phase study.

Review of the MIP images confirms the above findings.

NON-VASCULAR

Hepatobiliary: Liver is enlarged spanning 21 cm cranial caudal.
Borderline hepatic steatosis. No focal lesion on arterial phase
imaging. Gallbladder physiologically distended, no calcified stone.
No biliary dilatation.

Pancreas: No ductal dilatation or inflammation.

Spleen: Normal in size and arterial phase enhancement.

Adrenals/Urinary Tract: No adrenal nodule. No hydronephrosis or
perinephric edema. Homogeneous renal enhancement. Urinary bladder is
nondistended and not well evaluated.

Stomach/Bowel: Stomach is within normal limits. Appendix appears
normal. No evidence of bowel wall thickening, distention, or
inflammatory changes.

Lymphatic: No abdominopelvic adenopathy.

Reproductive: Uterus and bilateral adnexa are unremarkable.

Other: No free fluid or free air.

Musculoskeletal: Hemi transitional lumbosacral anatomy. Mild facet
hypertrophy lower lumbar spine.

Review of the MIP images confirms the above findings.
IMPRESSION: 1. Normal thoracoabdominal aorta without acute aortic abnormality or
aneurysm.
2. No acute findings in the chest, abdomen, or pelvis.
3. Mild hepatomegaly and borderline hepatic steatosis.

## 2019-11-16 ENCOUNTER — Other Ambulatory Visit: Payer: Self-pay

## 2019-11-16 ENCOUNTER — Emergency Department (HOSPITAL_COMMUNITY): Payer: BC Managed Care – PPO

## 2019-11-16 ENCOUNTER — Emergency Department (HOSPITAL_COMMUNITY)
Admission: EM | Admit: 2019-11-16 | Discharge: 2019-11-16 | Disposition: A | Discharge status: Home / Self Care | Payer: BC Managed Care – PPO | Attending: Emergency Medicine | Admitting: Emergency Medicine

## 2019-11-16 ENCOUNTER — Encounter (HOSPITAL_COMMUNITY): Payer: Self-pay

## 2019-11-16 DIAGNOSIS — Z79899 Other long term (current) drug therapy: Secondary | ICD-10-CM | POA: Diagnosis not present

## 2019-11-16 DIAGNOSIS — Z20822 Contact with and (suspected) exposure to covid-19: Secondary | ICD-10-CM | POA: Diagnosis not present

## 2019-11-16 DIAGNOSIS — I10 Essential (primary) hypertension: Secondary | ICD-10-CM | POA: Insufficient documentation

## 2019-11-16 DIAGNOSIS — R52 Pain, unspecified: Secondary | ICD-10-CM | POA: Insufficient documentation

## 2019-11-16 DIAGNOSIS — R42 Dizziness and giddiness: Secondary | ICD-10-CM | POA: Insufficient documentation

## 2019-11-16 LAB — CBC WITH DIFFERENTIAL/PLATELET
Abs Immature Granulocytes: 0.02 10*3/uL (ref 0.00–0.07)
Basophils Absolute: 0 10*3/uL (ref 0.0–0.1)
Basophils Relative: 0 %
Eosinophils Absolute: 0.2 10*3/uL (ref 0.0–0.5)
Eosinophils Relative: 2 %
HCT: 38.8 % (ref 36.0–46.0)
Hemoglobin: 12.1 g/dL (ref 12.0–15.0)
Immature Granulocytes: 0 %
Lymphocytes Relative: 34 %
Lymphs Abs: 2.8 10*3/uL (ref 0.7–4.0)
MCH: 26.7 pg (ref 26.0–34.0)
MCHC: 31.2 g/dL (ref 30.0–36.0)
MCV: 85.7 fL (ref 80.0–100.0)
Monocytes Absolute: 0.6 10*3/uL (ref 0.1–1.0)
Monocytes Relative: 7 %
Neutro Abs: 4.5 10*3/uL (ref 1.7–7.7)
Neutrophils Relative %: 57 %
Platelets: 286 10*3/uL (ref 150–400)
RBC: 4.53 MIL/uL (ref 3.87–5.11)
RDW: 15.2 % (ref 11.5–15.5)
WBC: 8.1 10*3/uL (ref 4.0–10.5)
nRBC: 0 % (ref 0.0–0.2)

## 2019-11-16 LAB — COMPREHENSIVE METABOLIC PANEL
ALT: 15 U/L (ref 0–44)
AST: 13 U/L — ABNORMAL LOW (ref 15–41)
Albumin: 3.4 g/dL — ABNORMAL LOW (ref 3.5–5.0)
Alkaline Phosphatase: 78 U/L (ref 38–126)
Anion gap: 8 (ref 5–15)
BUN: 13 mg/dL (ref 6–20)
CO2: 25 mmol/L (ref 22–32)
Calcium: 8.4 mg/dL — ABNORMAL LOW (ref 8.9–10.3)
Chloride: 104 mmol/L (ref 98–111)
Creatinine, Ser: 0.78 mg/dL (ref 0.44–1.00)
GFR calc Af Amer: >60 mL/min (ref >60)
GFR calc non Af Amer: >60 mL/min (ref >60)
Glucose, Bld: 114 mg/dL — ABNORMAL HIGH (ref 70–99)
Potassium: 3.9 mmol/L (ref 3.5–5.1)
Sodium: 137 mmol/L (ref 135–145)
Total Bilirubin: 0.3 mg/dL (ref 0.3–1.2)
Total Protein: 7 g/dL (ref 6.5–8.1)

## 2019-11-16 LAB — I-STAT BETA HCG BLOOD, ED (MC, WL, AP ONLY): I-stat hCG, quantitative: <5 m[IU]/mL (ref <5)

## 2019-11-16 LAB — TROPONIN I (HIGH SENSITIVITY): Troponin I (High Sensitivity): 3 ng/L (ref <18)

## 2019-11-16 LAB — LACTIC ACID, PLASMA: Lactic Acid, Venous: 1 mmol/L (ref 0.5–1.9)

## 2019-11-16 LAB — D-DIMER, QUANTITATIVE: D-Dimer, Quant: 0.49 ug/mL-FEU (ref 0.00–0.50)

## 2019-11-16 LAB — SARS CORONAVIRUS 2 BY RT PCR (HOSPITAL ORDER, PERFORMED IN ~~LOC~~ HOSPITAL LAB): SARS Coronavirus 2: NEGATIVE

## 2019-11-16 MED ORDER — KETOROLAC TROMETHAMINE 30 MG/ML IJ SOLN
15.0000 mg | Freq: Once | INTRAMUSCULAR | Status: AC
  Administered 2019-11-16: 15 mg via INTRAMUSCULAR
  Filled 2019-11-16: qty 1

## 2019-11-16 MED ORDER — ACETAMINOPHEN 325 MG PO TABS: 650.0000 mg | ORAL_TABLET | Freq: Four times a day (QID) | ORAL | 0 refills | Status: AC | PRN

## 2019-11-16 MED ORDER — IBUPROFEN 400 MG PO TABS: 400.0000 mg | ORAL_TABLET | Freq: Three times a day (TID) | ORAL | 0 refills | Status: AC

## 2019-11-16 MED FILL — IBUPROFEN 400 MG TABS: 400 | 3 days supply | Qty: 10 | Fill #0

## 2019-11-16 MED FILL — ACETAMINOPHEN 325 MG TABS: 325 | 5 days supply | Qty: 15 | Fill #0

## 2019-11-16 NOTE — Discharge Instructions (Addendum)
As discussed, your evaluation today has been largely reassuring.  But, it is important that you monitor your condition carefully, and do not hesitate to return to the ED if you develop new, or concerning changes in your condition.  A Covid test has been collected and sent for further investigation.  Otherwise, please follow-up with your physician for appropriate ongoing care.

## 2019-11-16 NOTE — ED Provider Notes (Signed)
[facility] EMERGENCY DEPARTMENT Provider Note   CSN: [id number] Arrival date & time: 11/16/19  5009     History Chief Complaint  Patient presents with  . Generalized Body Aches  . Dizziness    Gwendolyn Edwards is a 44 y.o. female.  HPI    Patient presents with concern of pain between her shoulder blades, fatigue, dyspnea. Patient has multiple medical issues, most notably episode of pericarditis and of last year. However, she notes that she was in her usual state of health until yesterday.  The day prior she drove to [place] and back to pick up her child.  Otherwise no recent changes in activity, diet. She does not smoke, drinks only socially. Since onset yesterday symptoms been persistent, with soreness in her legs bilaterally, habitus in general, and pain between her shoulder blades.  There is associated dyspnea.  She feels warm, but has no objective fever. It is unclear if she is taking any medication for pain relief, and symptoms are worse with activity. Past Medical History:  Diagnosis Date  . Abscess of neck 2019  . Acid reflux   . Anxiety   . Essential hypertension   . Pericarditis    Possible pericarditis/myocarditis December 2020    Patient Active Problem List   Diagnosis Date Noted  . Essential hypertension   . Acid reflux   . Anxiety   . Pericarditis 06/08/2019  . Chest pain at rest 06/05/2019  . Hypertensive heart disease 12/01/2018  . Screening for colorectal cancer 12/01/2018  . Encounter for gynecological examination with Papanicolaou smear of cervix 12/01/2018  . Abscess of neck 02/08/2018    Past Surgical History:  Procedure Laterality Date  . LEFT HEART CATH AND CORONARY ANGIOGRAPHY N/A 06/05/2019   Procedure: LEFT HEART CATH AND CORONARY ANGIOGRAPHY;  Surgeon: Tonny Bollman, MD;  Location: [facility] INVASIVE [facility];  Service: Cardiovascular;  Laterality: N/A;  . WISDOM TOOTH EXTRACTION       OB History    Gravida    5   Para  2   Term  2   Preterm      AB  3   Living  2     SAB      TAB      Ectopic      Multiple      Live Births              Family History  Problem Relation Age of Onset  . Kidney disease Mother   . Hepatitis C Mother   . Hypertension Mother   . Cancer Father        Osteosarcoma   . Thyroid disease Sister     Social History   Tobacco Use  . Smoking status: Never Smoker  . Smokeless tobacco: Never Used  Substance Use Topics  . Alcohol use: No  . Drug use: No    Home Medications Prior to Admission medications   Medication Sig Start Date End Date Taking? Authorizing Provider  amLODipine (NORVASC) 5 MG tablet Take 5 mg by mouth daily. 05/21/19  Yes [provider]  BIOTIN PO Take 1 tablet by mouth daily.   Yes [provider]  famotidine (PEPCID) 20 MG tablet Take 20 mg by mouth daily.    Yes [provider]  furosemide (LASIX) 20 MG tablet Take 20 mg by mouth daily as needed for fluid.  09/09/19  Yes [provider]  losartan-hydrochlorothiazide (HYZAAR) 100-25 MG tablet Take 1  tablet by mouth daily.   Yes [provider]  metoprolol succinate (TOPROL-XL) 50 MG 24 hr tablet Take 1 tablet (50 mg total) by mouth daily. 07/01/19  Yes Daune Perch, NP  Multiple Vitamin (MULTIVITAMIN PO) Take 1 tablet by mouth daily.   Yes [provider]  venlafaxine XR (EFFEXOR-XR) 75 MG 24 hr capsule Take 75 mg by mouth daily with breakfast.   Yes [provider]    Allergies    Doxycycline and Augmentin [amoxicillin-pot clavulanate]  Review of Systems   Review of Systems  Constitutional:       Per HPI, otherwise negative  HENT:       Per HPI, otherwise negative  Respiratory:       Per HPI, otherwise negative  Cardiovascular:       Per HPI, otherwise negative  Gastrointestinal: Negative for vomiting.  Endocrine:       Negative aside from HPI  Genitourinary:       Neg aside from HPI    Musculoskeletal:       Per HPI, otherwise negative  Skin: Negative.   Neurological: Negative for syncope.    Physical Exam Updated Vital Signs BP (!) 110/96   Pulse 73   Temp 98.7 F (37.1 C) (Oral)   Resp 19   Ht 5\' 3"  (1.6 m)   Wt 118.8 kg   LMP 10/21/2019   SpO2 100%   BMI 46.41 kg/m   Physical Exam Vitals and nursing note reviewed.  Constitutional:      General: She is not in acute distress.    Appearance: She is well-developed. She is obese.  HENT:     Head: Normocephalic and atraumatic.  Eyes:     Conjunctiva/sclera: Conjunctivae normal.  Cardiovascular:     Rate and Rhythm: Normal rate and regular rhythm.  Pulmonary:     Effort: Pulmonary effort is normal. No respiratory distress.     Breath sounds: Normal breath sounds. No stridor.  Abdominal:     General: There is no distension.  Musculoskeletal:     Right lower leg: No edema.     Left lower leg: No edema.  Skin:    General: Skin is warm and dry.  Neurological:     Mental Status: She is alert and oriented to person, place, and time.     Cranial Nerves: No cranial nerve deficit.     ED Results / Procedures / Treatments   Labs (all labs ordered are listed, but only abnormal results are displayed) Labs Reviewed  COMPREHENSIVE METABOLIC PANEL - Abnormal; Notable for the following components:      Result Value   Glucose, Bld 114 (*)    Calcium 8.4 (*)    Albumin 3.4 (*)    AST 13 (*)    All other components within normal limits  CBC WITH DIFFERENTIAL/PLATELET  LACTIC ACID, PLASMA  D-DIMER, QUANTITATIVE (NOT AT [facility])  I-STAT BETA HCG BLOOD, ED ([facility], [facility], [facility] ONLY)  TROPONIN I (HIGH SENSITIVITY)    EKG EKG Interpretation  Date/Time:  Wednesday Nov 16 2019 08:26:30 EDT Ventricular Rate:  69 PR Interval:  148 QRS Duration: 80 QT Interval:  386 QTC Calculation: 413 R Axis:   67 Text Interpretation: Normal sinus rhythm Anterior injury pattern T wave abnormality Abnormal ECG Confirmed by  Carmin Muskrat [phone]) on 11/16/2019 11:57:48 AM   Radiology DG Chest 2 View  Result Date: 11/16/2019 CLINICAL DATA:  Shortness of breath on exertion. EXAM: CHEST - 2 VIEW COMPARISON:  06/05/2019 FINDINGS: The cardiac silhouette, mediastinal and hilar contours are normal. The lungs are clear. No pleural effusion or pulmonary nodules. The bony thorax is intact. IMPRESSION: No acute cardiopulmonary findings. Electronically Signed   By: Rudie Meyer M.D.   On: 11/16/2019 09:03    Procedures Procedures (including critical care time)  Medications Ordered in ED Medications  ketorolac (TORADOL) 30 MG/ML injection 15 mg (has no administration in time range)    ED Course  I have reviewed the triage vital signs and the nursing notes.  Pertinent labs & imaging results that were available during my care of the patient were reviewed by me and considered in my medical decision making (see chart for details). After the initial evaluation I reviewed the patient's chart Including documentation from her episode of pericarditis last year. Patient has had uneventful cardiology follow-up at that time.  3:27 PM Patient awake, alert, speaking clearly, laughing with a companion in the room. We reviewed all labs, imaging results.  Patient's labs notable for negative D-dimer, troponin that is negligible, labs reassuring. No evidence for PE, atypical ACS, pneumonia, or other acute findings.  Given the patient's absence of substantial ongoing symptoms, her upright positioning, laughing, talking in a normal manner, there is further reassurance. We discussed considerations including prolonged time in the car contributing to her illness, occult Covid, though test has been sent, and the importance of home monitoring, anti-inflammatories, following up as needed. Absent other alarming findings, patient discharged in stable condition. Final Clinical Impression(s) / ED Diagnoses Final diagnoses:  Pain    Rx / DC  Orders ED Discharge Orders         Ordered    ibuprofen (ADVIL) 400 MG tablet  3 times daily     11/16/19 1530    acetaminophen (TYLENOL) 325 MG tablet  Every 6 hours PRN     11/16/19 1530           Gerhard Munch, MD 11/16/19 1531

## 2019-11-16 NOTE — ED Triage Notes (Signed)
Pt reports generalized body aches, feeling lightheaded and facial swelling. Pt states "it feels like when I was here in December for pericarditis". Pt having some SOB on exertion.

## 2019-11-16 NOTE — ED Notes (Signed)
Visitor at bedside.

## 2019-11-16 NOTE — ED Notes (Signed)
Pt d/c home per MD order. Discharge summary reviewed with pt- pt verbalizes understanding. Off unit via WC- no s/s of acute distress noted. Discharged home with visitor.

## 2020-04-04 ENCOUNTER — Ambulatory Visit (HOSPITAL_COMMUNITY)
Admission: RE | Admit: 2020-04-04 | Discharge: 2020-04-04 | Disposition: A | Discharge status: Home / Self Care | Payer: BC Managed Care – PPO | Source: Ambulatory Visit | Attending: Cardiology | Admitting: Cardiology

## 2020-04-04 ENCOUNTER — Other Ambulatory Visit: Payer: Self-pay

## 2020-04-04 DIAGNOSIS — Z8679 Personal history of other diseases of the circulatory system: Secondary | ICD-10-CM | POA: Insufficient documentation

## 2020-04-04 DIAGNOSIS — I10 Essential (primary) hypertension: Secondary | ICD-10-CM | POA: Diagnosis not present

## 2020-04-04 LAB — ECHOCARDIOGRAM COMPLETE
Area-P 1/2: 3.26 cm2
S' Lateral: 2.38 cm

## 2020-04-04 NOTE — Progress Notes (Signed)
*  PRELIMINARY RESULTS* Echocardiogram 2D Echocardiogram has been performed.  Stacey Drain 04/04/2020, 10:08 AM

## 2020-04-09 NOTE — Progress Notes (Deleted)
Cardiology Office Note  Date: 04/09/2020   ID: Gwendolyn Edwards, DOB Oct 10, 1975, MRN [id number]  PCP:  Ignatius Specking, MD  Cardiologist:  Nona Dell, MD Electrophysiologist:  None   No chief complaint on file.   History of Present Illness: Gwendolyn Edwards is a 44 y.o. female last seen in April.  Recent echocardiogram showed LVEF 60 to 65%, no regional wall motion abnormalities.  Past Medical History:  Diagnosis Date  . Abscess of neck 2019  . Acid reflux   . Anxiety   . Essential hypertension   . Pericarditis    Possible pericarditis/myocarditis December 2020    Past Surgical History:  Procedure Laterality Date  . LEFT HEART CATH AND CORONARY ANGIOGRAPHY N/A 06/05/2019   Procedure: LEFT HEART CATH AND CORONARY ANGIOGRAPHY;  Surgeon: Tonny Bollman, MD;  Location: [facility] INVASIVE CV LAB;  Service: Cardiovascular;  Laterality: N/A;  . WISDOM TOOTH EXTRACTION      Current Outpatient Medications  Medication Sig Dispense Refill  . amLODipine (NORVASC) 5 MG tablet Take 5 mg by mouth daily.    Marland Kitchen BIOTIN PO Take 1 tablet by mouth daily.    . famotidine (PEPCID) 20 MG tablet Take 20 mg by mouth daily.     . furosemide (LASIX) 20 MG tablet Take 20 mg by mouth daily as needed for fluid.     Marland Kitchen losartan-hydrochlorothiazide (HYZAAR) 100-25 MG tablet Take 1 tablet by mouth daily.    . metoprolol succinate (TOPROL-XL) 50 MG 24 hr tablet Take 1 tablet (50 mg total) by mouth daily. 90 tablet 3  . Multiple Vitamin (MULTIVITAMIN PO) Take 1 tablet by mouth daily.    Marland Kitchen venlafaxine XR (EFFEXOR-XR) 75 MG 24 hr capsule Take 75 mg by mouth daily with breakfast.     No current facility-administered medications for this visit.   Allergies:  Doxycycline and Augmentin [amoxicillin-pot clavulanate]   Social History: The patient  reports that she has never smoked. She has never used smokeless tobacco. She reports that she does not drink alcohol and does not use drugs.   Family  History: The patient's family history includes Cancer in her father; Hepatitis C in her mother; Hypertension in her mother; Kidney disease in her mother; Thyroid disease in her sister.   ROS:  Please see the history of present illness. Otherwise, complete review of systems is positive for {NONE DEFAULTED:18576::"none"}.  All other systems are reviewed and negative.   Physical Exam: VS:  There were no vitals taken for this visit., BMI There is no height or weight on file to calculate BMI.  Wt Readings from Last 3 Encounters:  11/16/19 262 lb (118.8 kg)  09/28/19 257 lb (116.6 kg)  07/01/19 261 lb 6.4 oz (118.6 kg)    General: Patient appears comfortable at rest. HEENT: Conjunctiva and lids normal, oropharynx clear with moist mucosa. Neck: Supple, no elevated JVP or carotid bruits, no thyromegaly. Lungs: Clear to auscultation, nonlabored breathing at rest. Cardiac: Regular rate and rhythm, no S3 or significant systolic murmur, no pericardial rub. Abdomen: Soft, nontender, no hepatomegaly, bowel sounds present, no guarding or rebound. Extremities: No pitting edema, distal pulses 2+. Skin: Warm and dry. Musculoskeletal: No kyphosis. Neuropsychiatric: Alert and oriented x3, affect grossly appropriate.  ECG:  An ECG dated 11/16/2019 was personally reviewed today and demonstrated:  Sinus rhythm.  Recent Labwork: 06/06/2019: Magnesium 2.0 07/01/2019: NT-Pro BNP 47 11/16/2019: ALT 15; AST 13; BUN 13; Creatinine, Ser 0.78; Hemoglobin 12.1; Platelets 286; Potassium 3.9; Sodium  137     Component Value Date/Time   CHOL 182 06/05/2019 1303   TRIG 43 06/05/2019 1303   HDL 50 06/05/2019 1303   CHOLHDL 3.6 06/05/2019 1303   VLDL 9 06/05/2019 1303   LDLCALC 123 (H) 06/05/2019 1303    Other Studies Reviewed Today:  Cardiac catheterization 06/05/2019: 1. Patent coronary arteries with minimal nonobstructive coronary artery disease, no evidence of a culprit lesion for STEMI. 2.  Hyperdynamic/normal LV systolic function with LVEF greater than 65% and normal LVEDP  Echocardiogram 04/04/2020: 1. Left ventricular ejection fraction, by estimation, is 60 to 65%. The  left ventricle has normal function. The left ventricle has no regional  wall motion abnormalities. There is mild left ventricular hypertrophy.  Left ventricular diastolic parameters  were normal.  2. Right ventricular systolic function is normal. The right ventricular  size is normal. Tricuspid regurgitation signal is inadequate for assessing  PA pressure.  3. The mitral valve is grossly normal. No evidence of mitral valve  regurgitation.  4. The aortic valve is tricuspid. Aortic valve regurgitation is not  visualized.  5. The inferior vena cava is normal in size with greater than 50%  respiratory variability, suggesting right atrial pressure of 3 mmHg.  Assessment and Plan:    Medication Adjustments/Labs and Tests Ordered: Current medicines are reviewed at length with the patient today.  Concerns regarding medicines are outlined above.   Tests Ordered: No orders of the defined types were placed in this encounter.   Medication Changes: No orders of the defined types were placed in this encounter.   Disposition:  Follow up {follow up:15908}  Signed, Jonelle Sidle, MD, [facility] 04/09/2020 8:31 PM    [facility] [address] [place], [place], [place] [address] Phone: [phone]; Fax: [phone]

## 2020-04-10 ENCOUNTER — Ambulatory Visit: Payer: BC Managed Care – PPO | Admitting: Cardiology

## 2020-04-10 DIAGNOSIS — Z8679 Personal history of other diseases of the circulatory system: Secondary | ICD-10-CM

## 2020-04-18 ENCOUNTER — Telehealth: Payer: Self-pay | Admitting: Orthopedic Surgery

## 2020-04-18 ENCOUNTER — Encounter (HOSPITAL_COMMUNITY): Payer: Self-pay

## 2020-04-18 ENCOUNTER — Emergency Department (HOSPITAL_COMMUNITY)
Admission: EM | Admit: 2020-04-18 | Discharge: 2020-04-18 | Disposition: A | Discharge status: Home / Self Care | Payer: Worker's Compensation | Attending: Emergency Medicine | Admitting: Emergency Medicine

## 2020-04-18 ENCOUNTER — Other Ambulatory Visit: Payer: Self-pay

## 2020-04-18 DIAGNOSIS — I10 Essential (primary) hypertension: Secondary | ICD-10-CM | POA: Diagnosis not present

## 2020-04-18 DIAGNOSIS — X501XXA Overexertion from prolonged static or awkward postures, initial encounter: Secondary | ICD-10-CM | POA: Diagnosis not present

## 2020-04-18 DIAGNOSIS — M25562 Pain in left knee: Secondary | ICD-10-CM | POA: Diagnosis present

## 2020-04-18 DIAGNOSIS — Z79899 Other long term (current) drug therapy: Secondary | ICD-10-CM | POA: Diagnosis not present

## 2020-04-18 MED ORDER — OXYCODONE-ACETAMINOPHEN 5-325 MG PO TABS: 1.0000 | ORAL_TABLET | Freq: Four times a day (QID) | ORAL | 0 refills | Status: DC | PRN

## 2020-04-18 NOTE — ED Provider Notes (Signed)
[facility] EMERGENCY DEPARTMENT Provider Note   CSN: [id number] Arrival date & time: 04/18/20  1027     History Chief Complaint  Patient presents with  . Knee Pain    Gwendolyn Edwards is a 44 y.o. female.  HPI Patient is a 44 year old female with past medical history of HTN and anxiety presented today with several days of left knee pain after she stepped her toe and twisted her knee.  She denies any trauma to her knee however.  She states she felt a pop during this incident.  She states the pain is achy, constant, seems to be worse when she is laying down at night.  She states she has been having trouble sleeping because the tramadol makes her itchy.  She is also been using Aleve and diclofenac gel.  No other associated symptoms.  No aggravating mitigating factors apart from those described above.  No other complaints at this time.    Past Medical History:  Diagnosis Date  . Abscess of neck 2019  . Acid reflux   . Anxiety   . Essential hypertension   . Pericarditis    Possible pericarditis/myocarditis December 2020    Patient Active Problem List   Diagnosis Date Noted  . Essential hypertension   . Acid reflux   . Anxiety   . Pericarditis 06/08/2019  . Chest pain at rest 06/05/2019  . Hypertensive heart disease 12/01/2018  . Screening for colorectal cancer 12/01/2018  . Encounter for gynecological examination with Papanicolaou smear of cervix 12/01/2018  . Abscess of neck 02/08/2018    Past Surgical History:  Procedure Laterality Date  . LEFT HEART CATH AND CORONARY ANGIOGRAPHY N/A 06/05/2019   Procedure: LEFT HEART CATH AND CORONARY ANGIOGRAPHY;  Surgeon: Tonny Bollman, MD;  Location: [facility] INVASIVE CV LAB;  Service: Cardiovascular;  Laterality: N/A;  . WISDOM TOOTH EXTRACTION       OB History    Gravida  5   Para  2   Term  2   Preterm      AB  3   Living  2     SAB      TAB      Ectopic      Multiple      Live Births               Family History  Problem Relation Age of Onset  . Kidney disease Mother   . Hepatitis C Mother   . Hypertension Mother   . Cancer Father        Osteosarcoma   . Thyroid disease Sister     Social History   Tobacco Use  . Smoking status: Never Smoker  . Smokeless tobacco: Never Used  Vaping Use  . Vaping Use: Never used  Substance Use Topics  . Alcohol use: No  . Drug use: No    Home Medications Prior to Admission medications   Medication Sig Start Date End Date Taking? Authorizing Provider  amLODipine (NORVASC) 5 MG tablet Take 5 mg by mouth daily. 05/21/19   [provider]  BIOTIN PO Take 1 tablet by mouth daily.    [provider]  famotidine (PEPCID) 20 MG tablet Take 20 mg by mouth daily.     [provider]  furosemide (LASIX) 20 MG tablet Take 20 mg by mouth daily as needed for fluid.  09/09/19   [provider]  losartan-hydrochlorothiazide (HYZAAR) 100-25 MG tablet Take 1 tablet by mouth daily.  [provider]  metoprolol succinate (TOPROL-XL) 50 MG 24 hr tablet Take 1 tablet (50 mg total) by mouth daily. 07/01/19   Berton Bon, NP  Multiple Vitamin (MULTIVITAMIN PO) Take 1 tablet by mouth daily.    [provider]  oxyCODONE-acetaminophen (PERCOCET/ROXICET) 5-325 MG tablet Take 1-2 tablets by mouth every 6 (six) hours as needed for severe pain (hold tylenol if using percocet). 04/18/20   Gailen Shelter, PA  venlafaxine XR (EFFEXOR-XR) 75 MG 24 hr capsule Take 75 mg by mouth daily with breakfast.    [provider]    Allergies    Doxycycline and Augmentin [amoxicillin-pot clavulanate]  Review of Systems   Review of Systems  Constitutional: Negative for fever.  Respiratory: Negative for shortness of breath.   Cardiovascular: Negative for chest pain.  Gastrointestinal: Negative for abdominal distention.  Musculoskeletal:       Knee pain left  Neurological: Negative for dizziness and  headaches.    Physical Exam Updated Vital Signs BP 137/81 (BP Location: Right Arm)   Pulse 81   Temp 98.7 F (37.1 C) (Oral)   Resp 18   Ht 5\' 3"  (1.6 m)   Wt 118.4 kg   SpO2 100%   BMI 46.23 kg/m   Physical Exam Vitals and nursing note reviewed.  Constitutional:      General: She is not in acute distress.    Appearance: Normal appearance. She is not ill-appearing.  HENT:     Head: Normocephalic and atraumatic.  Eyes:     General: No scleral icterus.       Right eye: No discharge.        Left eye: No discharge.     Conjunctiva/sclera: Conjunctivae normal.  Cardiovascular:     Comments: Bilateral DP PT pulse 3+ and symmetric Pulmonary:     Effort: Pulmonary effort is normal.     Breath sounds: No stridor.  Musculoskeletal:     Right lower leg: No edema.     Left lower leg: No edema.     Comments: Patient deferred range of motion due to pain in the left knee.  There is tenderness to palpation of the left knee joint.  No significant bony tenderness of the lateral anterior knee.  No bruising or deformity.  No calf swelling or edema or tenderness to palpation of the calf  Skin:    General: Skin is warm and dry.     Capillary Refill: Capillary refill takes less than 2 seconds.  Neurological:     Mental Status: She is alert and oriented to person, place, and time. Mental status is at baseline.     Comments: Sensation intact in bilateral lower extremities  Psychiatric:        Mood and Affect: Mood normal.        Behavior: Behavior normal.     ED Results / Procedures / Treatments   Labs (all labs ordered are listed, but only abnormal results are displayed) Labs Reviewed - No data to display  EKG None  Radiology No results found.  Procedures Procedures (including critical care time)  Medications Ordered in ED Medications - No data to display  ED Course  I have reviewed the triage vital signs and the nursing notes.  Pertinent labs & imaging results that  were available during my care of the patient were reviewed by me and considered in my medical decision making (see chart for details).    MDM Rules/Calculators/A&P  Patient had atraumatic left knee pain she did twisted 2 days ago was seen by urgent care and had a negative x-ray.  Still having pain.  Physical exam somewhat limited because of patient discomfort suspect the patient has meniscal injury given the mechanism and exam.  We will provide patient with a short course of Percocet for nighttime use and have follow-up with orthopedics.  He is already in leg immobilizer.  Final Clinical Impression(s) / ED Diagnoses Final diagnoses:  Acute pain of left knee    Rx / DC Orders ED Discharge Orders         Ordered    oxyCODONE-acetaminophen (PERCOCET/ROXICET) 5-325 MG tablet  Every 6 hours PRN        04/18/20 1229           Solon Augusta [place], [place] 04/18/20 1256    Vanetta Mulders, MD 04/20/20 2204

## 2020-04-18 NOTE — Discharge Instructions (Addendum)
Given your history and the mechanism for which she rendered I have some concern for an injury of the meniscus of your knee.  Please follow-up with the orthopedic doctor.  I have given you his information here (Dr. Dallas Schimke). Continue to wear the knee immobilizer   Please use Tylenol 1000 mg every 6 hours in addition to the diclofenac cream and Aleve.  I have also given you a short course of Percocet which you may use please refrain from using Tylenol if you remember take this.

## 2020-04-18 NOTE — Telephone Encounter (Signed)
Patient called twice while we were at lunch.  I called her back.  She said she went to the ED and wanted to schedule an appointment.  I offered an appointment for Friday morning with Dr. Dallas Schimke.  As went continued to talk, she told me that this problem happened while at work.  I asked her if she reported it and she said she did.  I told her that there was no mention of WC injury in her ED note.  She said it was to be covered under WC. I told her that in order for [place] to file [facility], we would need to get approval from someone in her company's HR department or from the [facility] insurance company themselves. She said she would call her HR department and have them get back to [place] regarding approval to her to be seen.  She said she would this right away.  We will await her call.

## 2020-04-18 NOTE — ED Triage Notes (Signed)
Pt to er, pt states that she was at work and hurt her knee, states that she went to urgent care and they did an x ray and nothing was broken, states that she was given tramadol and that is only making her itchy.  States that she hasn't been able to sleep because of the pain.

## 2020-04-19 NOTE — Telephone Encounter (Signed)
Gwendolyn Edwards called back this morning stating that she was given a list of providers that was in her network and Dr Romeo Apple was on that list .I did ask her again if she wanted to file this with her private insurance and she said no it should be under workman comp.  She said she thought this was a Audiological scientist.  I told her that normally our office must speak with someone from the workplace to get the [facility] insurance information and approval or an adjustor from the [facility] insurance would have to call our office to set up the appointment.  She said she would check into this again and call us back.

## 2020-04-19 NOTE — Telephone Encounter (Signed)
Call received from worker's comp representative Hervey Ard, [phone] / fax#[phone]; states not yet approved for scheduling appointment at our clinic. Okayed our office to fax our workers comp set up form. Sent; appointment pending. Called patient to relay status - reached voice mail, left message.

## 2020-04-25 NOTE — Telephone Encounter (Signed)
I received a call from [facility] Adjustor Benedict Needy.  She is going to fax notes to our office from [facility] and I have faxed her WC forms that need to be filled out.   Juliette Alcide Lockridge's phone number is [phone] and fax number is [phone].  I told her that once we have received this information back with her signature giving permission for [place] to treat this patient then I would call Karrah to schedule an appointment.

## 2020-04-27 NOTE — Telephone Encounter (Signed)
I spoke to patient and I left a message for the adjustor.  We are awaiting appointment date and time for the pt's MRI.  Once this is scheduled, she will call us and we will schedule her here with one of our providers.  She was told that she will need to bring the MRI disc and the report with her to the appointment here.  She understood this.  They will call me once MRI is scheduled.

## 2020-04-30 NOTE — Telephone Encounter (Signed)
Patient called me back stating her MRI has been scheduled at [facility] in [place] on Thursday, 05/10/20.  She has scheduled an appointment with Dr. Dallas Schimke for Tuesday, 05/15/20 at 9:30.  She knows to bring the CD with the images on it to her appointment.  I told her to check in with her WC adjustor, Benedict Needy and let her know about the MRI on the 18th and the appointment with Dr. Dallas Schimke on the 23rd.    She said she would do this.

## 2020-05-07 NOTE — Telephone Encounter (Signed)
As of 04/30/20, adjuster, and also patient, called to schedule - approved and scheduled following the scheduled MRI per Worker's comp. Patient aware of appointment on 05/15/20.

## 2020-05-15 ENCOUNTER — Ambulatory Visit: Payer: Self-pay | Admitting: Orthopedic Surgery

## 2020-05-22 ENCOUNTER — Encounter: Payer: Self-pay | Admitting: Orthopedic Surgery

## 2020-05-22 ENCOUNTER — Other Ambulatory Visit: Payer: Self-pay

## 2020-05-22 ENCOUNTER — Ambulatory Visit (INDEPENDENT_AMBULATORY_CARE_PROVIDER_SITE_OTHER): Payer: Worker's Compensation | Admitting: Orthopedic Surgery

## 2020-05-22 VITALS — BP 159/88 | HR 98 | Ht 63.0 in | Wt 265.0 lb

## 2020-05-22 DIAGNOSIS — S83412A Sprain of medial collateral ligament of left knee, initial encounter: Secondary | ICD-10-CM

## 2020-05-22 DIAGNOSIS — Z6841 Body Mass Index (BMI) 40.0 and over, adult: Secondary | ICD-10-CM | POA: Diagnosis not present

## 2020-05-22 NOTE — Progress Notes (Signed)
New Patient Visit  Assessment: Gwendolyn Edwards is a 44 y.o. female with the following: Left knee, MCL sprain   Plan: Mrs. Gwendolyn Edwards has sustained an injury to her left knee.  MRI demonstrates an MCL sprain, as well as some fraying of her medial meniscus, posterior horn.  Her pain has significantly improved in recent weeks.  She does not need to wear the knee immobilizer, and no restrictions on her activities.  We provided her with a referral for PT to begin immediately.  Continue with Aleve BID at a prescription dose, as needed.  We briefly discussed an injection if her pain persists, but I think time and focused strengthening will help.  No follow up needed.  Imaging CDs returned to the patient.    Follow-up: Return if symptoms worsen or fail to improve.  Subjective:  Chief Complaint  Patient presents with  . Knee Injury    Lt knee DOI 04/16/20    History of Present Illness: Gwendolyn Edwards is a 44 y.o. female who presents for evaluation of her left knee.  She states she was working about a month ago, when she was leaving a patient's room and tripped over end of the bed.  She fell and hurt her knee.  She was placed in a knee immobilizer and allowed to ambulate.  She presents today with an MRI.  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  She has been taking Naproxen BID with good effect.  No PT.  No injections.  Pain is mostly medial .   Review of Systems: No fevers or chills No numbness or tingling No chest pain No shortness of breath No bowel or bladder dysfunction No GI distress No headaches   Medical History:  Past Medical History:  Diagnosis Date  . Abscess of neck 2019  . Acid reflux   . Anxiety   . Essential hypertension   . Pericarditis    Possible pericarditis/myocarditis December 2020    Past Surgical History:  Procedure Laterality Date  . LEFT HEART CATH AND CORONARY ANGIOGRAPHY N/A  06/05/2019   Procedure: LEFT HEART CATH AND CORONARY ANGIOGRAPHY;  Surgeon: Tonny Bollman, MD;  Location: [facility] INVASIVE CV LAB;  Service: Cardiovascular;  Laterality: N/A;  . WISDOM TOOTH EXTRACTION      Family History  Problem Relation Age of Onset  . Kidney disease Mother   . Hepatitis C Mother   . Hypertension Mother   . Cancer Father        Osteosarcoma   . Thyroid disease Sister    Social History   Tobacco Use  . Smoking status: Never Smoker  . Smokeless tobacco: Never Used  Vaping Use  . Vaping Use: Never used  Substance Use Topics  . Alcohol use: No  . Drug use: No    Allergies  Allergen Reactions  . Doxycycline     Hives and itching   . Augmentin [Amoxicillin-Pot Clavulanate] Swelling and Rash    Tongue swells     Current Meds  Medication Sig  . amLODipine (NORVASC) 5 MG tablet Take 5 mg by mouth daily.  Marland Kitchen BIOTIN PO Take 1 tablet by mouth daily.  . famotidine (PEPCID) 20 MG tablet Take 20 mg by mouth daily.   . furosemide (LASIX) 20 MG tablet Take 20 mg by mouth daily as needed for fluid.   Marland Kitchen losartan-hydrochlorothiazide (HYZAAR) 100-25 MG tablet Take 1 tablet by mouth daily.  Marland Kitchen  metoprolol succinate (TOPROL-XL) 50 MG 24 hr tablet Take 1 tablet (50 mg total) by mouth daily.  . Multiple Vitamin (MULTIVITAMIN PO) Take 1 tablet by mouth daily.  Marland Kitchen oxyCODONE-acetaminophen (PERCOCET/ROXICET) 5-325 MG tablet Take 1-2 tablets by mouth every 6 (six) hours as needed for severe pain (hold tylenol if using percocet).  . venlafaxine XR (EFFEXOR-XR) 75 MG 24 hr capsule Take 75 mg by mouth daily with breakfast.    Objective: BP (!) 159/88   Pulse 98   Ht 5\' 3"  (1.6 m)   Wt 265 lb (120.2 kg)   BMI 46.94 kg/m   Physical Exam:  General: Obese female,  No acute distress.  Gait: Mild, left sided antalgic gait  Left knee without effusion. Grade 1 laxity to valgus stress at 30 degrees.  Mild TTP medial joint line.  Full ROM, some pain at extremes.  Negative Lachman.       IMAGING: I personally reviewed images previously obtained in clinic   XR of left knee - mild degenerative changes, specifically within the medial compartment  MRI of left knee shows incidental hyperintense lesion within distal femur, which does not show up on XR, MCL sprain and some fraying of the posterior horn medial meniscus   New Medications:  No orders of the defined types were placed in this encounter.     , MD  05/22/2020 3:40 PM

## 2020-05-23 ENCOUNTER — Telehealth: Payer: Self-pay | Admitting: Orthopedic Surgery

## 2020-05-23 MED ORDER — DICLOFENAC SODIUM 75 MG PO TBEC
75.0000 mg | DELAYED_RELEASE_TABLET | Freq: Two times a day (BID) | ORAL | 0 refills | Status: DC
Start: 2020-05-23 — End: 2020-12-17

## 2020-05-23 NOTE — Telephone Encounter (Signed)
Patient has just called following her office visit yesterday; relays she returned to work and is having increased pain in her left knee cap area; asking if Dr Dallas Schimke can prescribe something for the pain. If so, patient uses Psychologist, forensic in [place].

## 2020-05-23 NOTE — Telephone Encounter (Signed)
Thank you  Diclofenac prescription sent to pharmacy on file.  Please let the patient know if she is not already aware.   Thanks   Betsaida [place] A. Dallas Schimke, MD MS [facility] [address] [place],  [place]  [address] Phone: [phone] Fax: [phone]

## 2020-05-24 NOTE — Telephone Encounter (Signed)
I called her to advise / 

## 2020-05-25 ENCOUNTER — Ambulatory Visit: Payer: Self-pay | Admitting: Orthopedic Surgery

## 2020-05-31 ENCOUNTER — Ambulatory Visit (HOSPITAL_COMMUNITY): Payer: Self-pay | Admitting: Physical Therapy

## 2020-06-27 ENCOUNTER — Telehealth: Payer: Self-pay | Admitting: Orthopedic Surgery

## 2020-06-27 ENCOUNTER — Encounter (HOSPITAL_COMMUNITY): Payer: Self-pay

## 2020-06-27 ENCOUNTER — Other Ambulatory Visit: Payer: Self-pay

## 2020-06-27 ENCOUNTER — Ambulatory Visit (HOSPITAL_COMMUNITY): Payer: Worker's Compensation | Attending: Orthopedic Surgery

## 2020-06-27 DIAGNOSIS — M25662 Stiffness of left knee, not elsewhere classified: Secondary | ICD-10-CM | POA: Diagnosis present

## 2020-06-27 DIAGNOSIS — S83412A Sprain of medial collateral ligament of left knee, initial encounter: Secondary | ICD-10-CM

## 2020-06-27 DIAGNOSIS — M25562 Pain in left knee: Secondary | ICD-10-CM | POA: Diagnosis present

## 2020-06-27 DIAGNOSIS — M6281 Muscle weakness (generalized): Secondary | ICD-10-CM | POA: Diagnosis present

## 2020-06-27 NOTE — Therapy (Signed)
[place], [place], [address] Phone: [phone]   Fax:  [phone]  Physical Therapy Evaluation  Patient Details  Name: Gwendolyn Edwards MRN: [id number] Date of Birth: 1976/05/29 Referring Provider (PT): Dr. Larena Glassman   Encounter Date: 06/27/2020   PT End of Session - 06/27/20 1629    Visit Number 1    Number of Visits 12    Date for PT Re-Evaluation 08/08/20    Authorization Type Worker's Comp    Authorization - Visit Number 1    Authorization - Number of Visits 12    Progress Note Due on Visit 10    PT Start Time 6568    PT Stop Time 1640    PT Time Calculation (min) 52 min    Activity Tolerance Patient limited by pain    Behavior During Therapy [facility] for tasks assessed/performed           Past Medical History:  Diagnosis Date  . Abscess of neck 2019  . Acid reflux   . Anxiety   . Essential hypertension   . Pericarditis    Possible pericarditis/myocarditis December 2020    Past Surgical History:  Procedure Laterality Date  . LEFT HEART CATH AND CORONARY ANGIOGRAPHY N/A 06/05/2019   Procedure: LEFT HEART CATH AND CORONARY ANGIOGRAPHY;  Surgeon: Sherren Mocha, MD;  Location: [place] [facility];  Service: Cardiovascular;  Laterality: N/A;  . WISDOM TOOTH EXTRACTION      There were no vitals filed for this visit.    Subjective Assessment - 06/27/20 1603    Subjective Patient experienced a fall at work on 04/16/20 when she slipped and twisted her knee and experienced Grade I MCL sprain.  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 Patient reports increased pain as she has transitioned back to her work.    How long can you walk comfortably? 3 hours during her shift    Currently in Pain? Yes    Pain Score 8     Pain Location Knee    Pain Orientation Left    Pain Descriptors / Indicators Aching;Burning    Pain Type  Chronic pain    Pain Radiating Towards along medial lower leg    Aggravating Factors  walking, kneeling, squatting,              [facility] PT Assessment - 06/27/20 0001      Assessment   Medical Diagnosis Left MCL sprain    Referring Provider (PT) Dr. Larena Glassman    Onset Date/Surgical Date 04/16/20      Balance Screen   Has the patient fallen in the past 6 months Yes    How many times? 1    Has the patient had a decrease in activity level because of a fear of falling?  Yes    Is the patient reluctant to leave their home because of a fear of falling?  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 Pes anserine TTP      Ambulation/Gait   Ambulation/Gait Yes    Ambulation/Gait Assistance 7: Independent    Ambulation Distance (Feet) 250 Feet    Assistive device None    Gait Pattern Antalgic    Ambulation Surface Level    Stairs Yes    Stairs Assistance 6: Modified independent (Device/Increase time)    Stair Management Technique Two rails;Step to pattern    Gait Comments                      Objective measurements  completed on examination: See above findings.               PT Education - 06/27/20 1628    Education Details pt education in HEP details and MCL anatomy.  Education/discussion in hinged knee brace for medial support    Person(s) Educated Patient    Methods Explanation    Comprehension Verbalized understanding            PT Short Term Goals - 06/27/20 1648      PT SHORT TERM GOAL #1   Title Patient will report at least 25% improvement in symptoms for improved quality of life.    Time 3    Period Weeks    Status New    Target Date 07/18/20      PT SHORT TERM GOAL #2   Title Patient will be independent with HEP in order to improve functional outcomes.    Time 3    Period Weeks    Status New    Target Date 07/18/20      PT SHORT TERM GOAL #3   Title Patient will be able to ambulate at least 300 feet in in order to demonstrate improved gait speed for community ambulation with pain not exceeding 4/10 left knee    Baseline 250 with 8/10 left knee pain    Time 3    Period Weeks    Status New    Target Date 07/18/20      PT SHORT TERM GOAL #4   Title Patient will demo proper squat technique 5/5 observations to improve body mechanics and reduce risk for reinjury    Baseline poor    Time 3    Period Weeks    Status New    Target Date 07/18/20             PT Long Term Goals - 06/27/20 1650      PT LONG TERM GOAL #1   Title Patient will report at least 75% improvement in symptoms for improved quality of life.    Time 6    Period Weeks    Status New    Target Date 08/08/20  PT LONG TERM GOAL #2   Title Patient will improve on FOTO score to meet predicted outcomes to improve functional independence    Baseline 47% function    Time 6    Period Weeks    Status New    Target Date 08/08/20      PT LONG TERM GOAL #3   Title Patient will manifest increased LLE strength and decrease in pain as evidenced by ability to climb/desend 10 stairs with  reciprocal pattern    Baseline step-to-step    Time 6    Period Weeks    Status New    Target Date 08/08/20                  Plan - 06/27/20 1643    Clinical Impression Statement Patient is a 45 yo lady presenting to physical therapy with left knee pain/instability following work-related injury of MCL sprain.  She presents with pain and deficits in LLE strength, ROM, endurance, gait deviations and functional mobility with ADL. She is having to modify and restrict ADL as indicated by FOTO score as well as subjective information and objective measures which is affecting overall participation. Patient will benefit from skilled physical therapy in order to improve function and reduce impairment.    Personal Factors and Comorbidities Comorbidity 1;Profession;Time since onset of injury/illness/exacerbation    Comorbidities PMH    Examination-Activity Limitations Stairs;Squat;Locomotion Level;Stand;Lift    Examination-Participation Restrictions Community Activity;Occupation    Stability/Clinical Decision Making Stable/Uncomplicated    Clinical Decision Making Low    Rehab Potential Good    PT Frequency 2x / week    PT Duration 6 weeks    PT Treatment/Interventions ADLs/Self Care Home Management;Aquatic Therapy;Biofeedback;Cryotherapy;Electrical Stimulation;Ultrasound;Traction;Moist Heat;DME Instruction;Gait training;Stair training;Functional mobility training;Therapeutic activities;Therapeutic exercise;Patient/family education;Neuromuscular re-education;Balance training;Manual techniques;Taping;Energy conservation;Dry needling;Passive range of motion;Joint Manipulations    PT Next Visit Plan Squat form/lifting mechanics, open chain LE PRE (3-way SLR)    PT Home Exercise Plan QS, heel slides, hamstring isometric           Patient will benefit from skilled therapeutic intervention in order to improve the following deficits and impairments:  Abnormal gait,Decreased activity  tolerance,Decreased balance,Decreased knowledge of precautions,Decreased endurance,Decreased knowledge of use of DME,Decreased mobility,Decreased range of motion,Difficulty walking,Decreased strength,Impaired perceived functional ability,Obesity,Improper body mechanics,Pain  Visit Diagnosis: Left knee pain, unspecified chronicity  Muscle weakness (generalized)  Stiffness of left knee, not elsewhere classified     Problem List Patient Active Problem List   Diagnosis Date Noted  . Essential hypertension   . Acid reflux   . Anxiety   . Pericarditis 06/08/2019  . Chest pain at rest 06/05/2019  . Hypertensive heart disease 12/01/2018  . Screening for colorectal cancer 12/01/2018  . Encounter for gynecological examination with Papanicolaou smear of cervix 12/01/2018  . Abscess of neck 02/08/2018    4:56 PM, 06/27/20 M. Shary Decamp, PT, DPT Physical Therapist- [place] Office Number: [phone]  [facility] [address] [place], [place], [address] Phone: [phone]   Fax:  [phone]  Name: Gwendolyn Edwards MRN: [id number] Date of Birth: [birth year]

## 2020-06-27 NOTE — Telephone Encounter (Signed)
Patient called with question - states had physical therapy visit at [facility] rehab, [facility], as approved by [employer]. States that a brace was recommended. Please advise per therapy visit notes if patient needs to come back in for office visit for brace?

## 2020-06-27 NOTE — Patient Instructions (Signed)
Access Code: [email or web address] URL: [email or web address] Date: 06/27/2020 Prepared by: Shary Decamp  Exercises Supine Quad Set - 1 x daily - 7 x weekly - 3 sets - 10 reps - 2 sec hold Supine Heel Slide with Strap - 1 x daily - 7 x weekly - 3 sets - 10 reps - 5 sec hold Supine Isometric Hamstring Set - 1 x daily - 7 x weekly - 3 sets - 10 reps - 2 sec hold

## 2020-06-28 NOTE — Telephone Encounter (Signed)
I spoke with the patient and she was asking about a brace with metal clips from therapy.  Orders placed for the brace.

## 2020-06-29 ENCOUNTER — Encounter (HOSPITAL_COMMUNITY): Payer: Self-pay

## 2020-06-29 ENCOUNTER — Other Ambulatory Visit: Payer: Self-pay

## 2020-06-29 ENCOUNTER — Ambulatory Visit (HOSPITAL_COMMUNITY): Payer: Self-pay | Attending: Orthopedic Surgery

## 2020-06-29 DIAGNOSIS — M25562 Pain in left knee: Secondary | ICD-10-CM | POA: Insufficient documentation

## 2020-06-29 DIAGNOSIS — M6281 Muscle weakness (generalized): Secondary | ICD-10-CM | POA: Insufficient documentation

## 2020-06-29 DIAGNOSIS — M25662 Stiffness of left knee, not elsewhere classified: Secondary | ICD-10-CM | POA: Insufficient documentation

## 2020-06-29 NOTE — Therapy (Signed)
[place], [place], [address] Phone: [phone]   Fax:  [phone]  Physical Therapy Treatment  Patient Details  Name: Gwendolyn Edwards MRN: [id number] Date of Birth: [birth year] Referring Provider (PT): Dr. Larena Glassman   Encounter Date: 06/29/2020   PT End of Session - 06/29/20 1607    Visit Number 2    Number of Visits 12    Date for PT Re-Evaluation 08/08/20    Authorization Type Worker's Comp    Authorization - Visit Number 2    Authorization - Number of Visits 12    Progress Note Due on Visit 10    PT Start Time 1600    PT Stop Time 1640    PT Time Calculation (min) 40 min    Activity Tolerance Patient limited by pain    Behavior During Therapy [facility] for tasks assessed/performed           Past Medical History:  Diagnosis Date  . Abscess of neck 2019  . Acid reflux   . Anxiety   . Essential hypertension   . Pericarditis    Possible pericarditis/myocarditis December 2020    Past Surgical History:  Procedure Laterality Date  . LEFT HEART CATH AND CORONARY ANGIOGRAPHY N/A 06/05/2019   Procedure: LEFT HEART CATH AND CORONARY ANGIOGRAPHY;  Surgeon: Sherren Mocha, MD;  Location: [place] [facility];  Service: Cardiovascular;  Laterality: N/A;  . WISDOM TOOTH EXTRACTION      There were no vitals filed for this visit.   Subjective Assessment - 06/29/20 1604    Subjective Patient reports continued soreness and pain in left knee especially after coming off her work shift.  Patient reports continued pain/tenderness along left medial knee. Patient reports continued pain with walking and job duties    How long can you walk comfortably? 3 hours during her shift    Currently in Pain? Yes    Pain Score 9     Pain Location Knee    Pain Orientation Left    Pain Descriptors / Indicators Aching;Burning    Pain Type Chronic pain                             [facility] Adult PT  Treatment/Exercise - 06/29/20 0001      Exercises   Exercises Knee/Hip      Knee/Hip Exercises: Standing   Other Standing Knee Exercises side stepping with red t-loop x 2 min      Knee/Hip Exercises: Seated   Long Arc Quad Strengthening;Left;2 sets;10 reps   red t-loop   Hamstring Curl Strengthening;Left;2 sets;10 reps   red t-loop     Knee/Hip Exercises: Supine   Quad Sets Strengthening;Left;2 sets;10 reps    Heel Slides AAROM;Left;2 sets;10 reps    Straight Leg Raises Strengthening;Left;2 sets;10 reps      Modalities   Modalities Cryotherapy      Cryotherapy   Number Minutes Cryotherapy 10 Minutes    Cryotherapy Location Knee   left   Type of Cryotherapy Ice pack                  PT Education - 06/29/20 1625    Education Details Pt education on continuation of HEP and emphasis on quad/hamstring strength to improve knee function and compensate for MCL deficiency    Person(s) Educated Patient    Methods Explanation;Demonstration    Comprehension Verbalized understanding;Returned demonstration  PT Short Term Goals - 06/27/20 1648      PT SHORT TERM GOAL #1   Title Patient will report at least 25% improvement in symptoms for improved quality of life.    Time 3    Period Weeks    Status New    Target Date 07/18/20      PT SHORT TERM GOAL #2   Title Patient will be independent with HEP in order to improve functional outcomes.    Time 3    Period Weeks    Status New    Target Date 07/18/20      PT SHORT TERM GOAL #3   Title Patient will be able to ambulate at least 300 feet in in order to demonstrate improved gait speed for community ambulation with pain not exceeding 4/10 left knee    Baseline 250 with 8/10 left knee pain    Time 3    Period Weeks    Status New    Target Date 07/18/20      PT SHORT TERM GOAL #4   Title Patient will demo proper squat technique 5/5 observations to improve body mechanics and reduce risk for reinjury     Baseline poor    Time 3    Period Weeks    Status New    Target Date 07/18/20             PT Long Term Goals - 06/27/20 1650      PT LONG TERM GOAL #1   Title Patient will report at least 75% improvement in symptoms for improved quality of life.    Time 6    Period Weeks    Status New    Target Date 08/08/20      PT LONG TERM GOAL #2   Title Patient will improve on FOTO score to meet predicted outcomes to improve functional independence    Baseline 47% function    Time 6    Period Weeks    Status New    Target Date 08/08/20      PT LONG TERM GOAL #3   Title Patient will manifest increased LLE strength and decrease in pain as evidenced by ability to climb/desend 10 stairs with reciprocal pattern    Baseline step-to-step    Time 6    Period Weeks    Status New    Target Date 08/08/20                 Plan - 06/29/20 1608    Clinical Impression Statement 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  Patient demo good HEP recall and tolerating left knee exercises well with rest periods as needed. Patient educated on benefits of using ice during her shift to ameliorate some pain.  Continues to demo antalgic gait pattern with decreased weight acceptance LLE in stance phase.  Patient reports she has ortho MD f/u next Friday 07/06/20 to inquire about a knee brace or other tx options.    Personal Factors and Comorbidities Comorbidity 1;Profession;Time since onset of injury/illness/exacerbation    Comorbidities PMH    Examination-Activity Limitations Stairs;Squat;Locomotion Level;Stand;Lift    Examination-Participation Restrictions Community Activity;Occupation    Stability/Clinical Decision Making Stable/Uncomplicated    Rehab Potential Good    PT Frequency 2x / week    PT Duration 6 weeks    PT Treatment/Interventions ADLs/Self Care  Home Management;Aquatic  Therapy;Biofeedback;Cryotherapy;Electrical Stimulation;Ultrasound;Traction;Moist Heat;DME Instruction;Gait training;Stair training;Functional mobility training;Therapeutic activities;Therapeutic exercise;Patient/family education;Neuromuscular re-education;Balance training;Manual techniques;Taping;Energy conservation;Dry needling;Passive range of motion;Joint Manipulations    PT Next Visit Plan Squat form/lifting mechanics, open chain LE PRE (3-way SLR)    PT Home Exercise Plan QS, heel slides, hamstring isometric           Patient will benefit from skilled therapeutic intervention in order to improve the following deficits and impairments:  Abnormal gait,Decreased activity tolerance,Decreased balance,Decreased knowledge of precautions,Decreased endurance,Decreased knowledge of use of DME,Decreased mobility,Decreased range of motion,Difficulty walking,Decreased strength,Impaired perceived functional ability,Obesity,Improper body mechanics,Pain  Visit Diagnosis: Left knee pain, unspecified chronicity  Muscle weakness (generalized)  Stiffness of left knee, not elsewhere classified     Problem List Patient Active Problem List   Diagnosis Date Noted  . Essential hypertension   . Acid reflux   . Anxiety   . Pericarditis 06/08/2019  . Chest pain at rest 06/05/2019  . Hypertensive heart disease 12/01/2018  . Screening for colorectal cancer 12/01/2018  . Encounter for gynecological examination with Papanicolaou smear of cervix 12/01/2018  . Abscess of neck 02/08/2018    4:46 PM, 06/29/20 M. Shary Decamp, PT, DPT Physical Therapist- [place] Office Number: [phone]  [facility] [address] [place], [place], [address] Phone: [phone]   Fax:  [phone]  Name: Gwendolyn Edwards MRN: [id number] Date of Birth: Nov 10, 1975

## 2020-07-03 ENCOUNTER — Other Ambulatory Visit: Payer: Self-pay

## 2020-07-03 ENCOUNTER — Ambulatory Visit (HOSPITAL_COMMUNITY): Payer: Self-pay | Attending: Orthopedic Surgery | Admitting: Physical Therapy

## 2020-07-03 DIAGNOSIS — M6281 Muscle weakness (generalized): Secondary | ICD-10-CM

## 2020-07-03 DIAGNOSIS — M25662 Stiffness of left knee, not elsewhere classified: Secondary | ICD-10-CM

## 2020-07-03 DIAGNOSIS — M25562 Pain in left knee: Secondary | ICD-10-CM

## 2020-07-03 NOTE — Therapy (Signed)
[facility] [address] [place], [place], [address] Phone: [phone]   Fax:  [phone]  Physical Therapy Treatment  Patient Details  Name: SHAUGHNESSY GETHERS MRN: [id number] Date of Birth: March 21, 1976 Referring Provider (PT): Dr. Thane Edu   Encounter Date: 07/03/2020   PT End of Session - 07/03/20 1652    Visit Number 3    Number of Visits 12    Date for PT Re-Evaluation 08/08/20    Authorization Type Worker's Comp    Authorization - Visit Number 3    Authorization - Number of Visits 12    Progress Note Due on Visit 10    PT Start Time 1610    PT Stop Time 1655    PT Time Calculation (min) 45 min    Activity Tolerance Patient limited by pain    Behavior During Therapy [facility] for tasks assessed/performed           Past Medical History:  Diagnosis Date  . Abscess of neck 2019  . Acid reflux   . Anxiety   . Essential hypertension   . Pericarditis    Possible pericarditis/myocarditis December 2020    Past Surgical History:  Procedure Laterality Date  . LEFT HEART CATH AND CORONARY ANGIOGRAPHY N/A 06/05/2019   Procedure: LEFT HEART CATH AND CORONARY ANGIOGRAPHY;  Surgeon: Tonny Bollman, MD;  Location: [facility] INVASIVE CV LAB;  Service: Cardiovascular;  Laterality: N/A;  . WISDOM TOOTH EXTRACTION      There were no vitals filed for this visit.   Subjective Assessment - 07/03/20 1614    Subjective pt states her pain is 9/10 all day long, states she wants to" pull her knee cap off, clean it and put it back on" to diminish the pain.  States the pain has been horendous the last 2 days since completing the exercises starting at the [facility] and radiating underneath her knee cap.    Currently in Pain? Yes    Pain Location Knee    Pain Orientation Left    Pain Descriptors / Indicators Aching;Burning;Radiating    Pain Type Chronic pain                             [facility] Adult PT Treatment/Exercise - 07/03/20  0001      Knee/Hip Exercises: Supine   Quad Sets Strengthening;Left;2 sets;10 reps    Short Arc Quad Sets Left;10 reps;2 sets    [employer] Sets Limitations 5 second holds    Heel Slides AAROM;Left;10 reps;1 set    Straight Leg Raises Strengthening;Left;2 sets;10 reps      Modalities   Modalities Moist Heat      Moist Heat Therapy   Number Minutes Moist Heat 10 Minutes    Moist Heat Location Knee                  PT Education - 07/03/20 1653    Education Details instructed to continue HEP but to remove bands/resistance at this time.  Encouraged to follow up with MD regarding increased pain.    Person(s) Educated Patient    Methods Explanation    Comprehension Verbalized understanding            PT Short Term Goals - 06/27/20 1648      PT SHORT TERM GOAL #1   Title Patient will report at least 25% improvement in symptoms for improved quality of life.  Time 3    Period Weeks    Status New    Target Date 07/18/20      PT SHORT TERM GOAL #2   Title Patient will be independent with HEP in order to improve functional outcomes.    Time 3    Period Weeks    Status New    Target Date 07/18/20      PT SHORT TERM GOAL #3   Title Patient will be able to ambulate at least 300 feet in in order to demonstrate improved gait speed for community ambulation with pain not exceeding 4/10 left knee    Baseline 250 with 8/10 left knee pain    Time 3    Period Weeks    Status New    Target Date 07/18/20      PT SHORT TERM GOAL #4   Title Patient will demo proper squat technique 5/5 observations to improve body mechanics and reduce risk for reinjury    Baseline poor    Time 3    Period Weeks    Status New    Target Date 07/18/20             PT Long Term Goals - 06/27/20 1650      PT LONG TERM GOAL #1   Title Patient will report at least 75% improvement in symptoms for improved quality of life.    Time 6    Period Weeks    Status New    Target Date  08/08/20      PT LONG TERM GOAL #2   Title Patient will improve on FOTO score to meet predicted outcomes to improve functional independence    Baseline 47% function    Time 6    Period Weeks    Status New    Target Date 08/08/20      PT LONG TERM GOAL #3   Title Patient will manifest increased LLE strength and decrease in pain as evidenced by ability to climb/desend 10 stairs with reciprocal pattern    Baseline step-to-step    Time 6    Period Weeks    Status New    Target Date 08/08/20                 Plan - 07/03/20 1647    Clinical Impression Statement Pt with increased pain today.  Continued with established exercises without addition of exercises due to pain.  Discontinued theraband exercises today and completed without external resistance.   Completed isometric exercises with moist heat at EOS.  Pt to return to MD Friday with hopes of an injection or bracing to help reduce pain.  Heat helped to ease pain, however still present and causing antalgic gait.    Personal Factors and Comorbidities Comorbidity 1;Profession;Time since onset of injury/illness/exacerbation    Comorbidities PMH    Examination-Activity Limitations Stairs;Squat;Locomotion Level;Stand;Lift    Examination-Participation Restrictions Community Activity;Occupation    Stability/Clinical Decision Making Stable/Uncomplicated    Rehab Potential Good    PT Frequency 2x / week    PT Duration 6 weeks    PT Treatment/Interventions ADLs/Self Care Home Management;Aquatic Therapy;Biofeedback;Cryotherapy;Electrical Stimulation;Ultrasound;Traction;Moist Heat;DME Instruction;Gait training;Stair training;Functional mobility training;Therapeutic activities;Therapeutic exercise;Patient/family education;Neuromuscular re-education;Balance training;Manual techniques;Taping;Energy conservation;Dry needling;Passive range of motion;Joint Manipulations    PT Next Visit Plan Gentle progression form open to closed chain exericses.   next session begin Gl[employer]als for Lt LE with addition of PRE's per pain tolerance;  progress to Squat form/lifting mechanics    PT Home  Exercise Plan QS, heel slides, hamstring isometric           Patient will benefit from skilled therapeutic intervention in order to improve the following deficits and impairments:  Abnormal gait,Decreased activity tolerance,Decreased balance,Decreased knowledge of precautions,Decreased endurance,Decreased knowledge of use of DME,Decreased mobility,Decreased range of motion,Difficulty walking,Decreased strength,Impaired perceived functional ability,Obesity,Improper body mechanics,Pain  Visit Diagnosis: Left knee pain, unspecified chronicity  Muscle weakness (generalized)  Stiffness of left knee, not elsewhere classified     Problem List Patient Active Problem List   Diagnosis Date Noted  . Essential hypertension   . Acid reflux   . Anxiety   . Pericarditis 06/08/2019  . Chest pain at rest 06/05/2019  . Hypertensive heart disease 12/01/2018  . Screening for colorectal cancer 12/01/2018  . Encounter for gynecological examination with Papanicolaou smear of cervix 12/01/2018  . Abscess of neck 02/08/2018   Lurena Nida, PTA/CLT [phone]  Lurena Nida 07/03/2020, 4:57 PM  [facility] [address] [place], [place], [address] Phone: [phone]   Fax:  [phone]  Name: AUBRIELLE STROUD MRN: [id number] Date of Birth: Apr 27, 1976

## 2020-07-05 ENCOUNTER — Ambulatory Visit (HOSPITAL_COMMUNITY): Payer: Self-pay | Admitting: Physical Therapy

## 2020-07-06 ENCOUNTER — Encounter: Payer: Self-pay | Admitting: Orthopedic Surgery

## 2020-07-06 ENCOUNTER — Other Ambulatory Visit: Payer: Self-pay

## 2020-07-06 ENCOUNTER — Ambulatory Visit (INDEPENDENT_AMBULATORY_CARE_PROVIDER_SITE_OTHER): Payer: Worker's Compensation | Admitting: Orthopedic Surgery

## 2020-07-06 VITALS — BP 155/87 | HR 82 | Ht 62.0 in | Wt 264.4 lb

## 2020-07-06 DIAGNOSIS — M7052 Other bursitis of knee, left knee: Secondary | ICD-10-CM | POA: Diagnosis not present

## 2020-07-06 NOTE — Patient Instructions (Signed)

## 2020-07-06 NOTE — Progress Notes (Signed)
Orthopaedic Clinic Return  Assessment: Gwendolyn Edwards is a 45 y.o. female with the following: Left MCL sprain Left Pes anserine bursitis  Plan: [name] returns to clinic today and is continued to have medial sided left knee pain.  She previous had an MRI which demonstrated an MCL strain.  She has started physical therapy, but notes worsening pain with the appropriate activities.  Based on her physical exam, I do feel that she has developed some pes anserine bursitis.  We discussed the possibility of proceeding with an injection, and she is interested in proceeding.  She can continue taking the medications he is currently, which does include Voltaren topical gel.  She is also interested in obtaining a knee sleeve, and was fitted for this in clinic today.  Continue physical therapy, follow-up as needed.  Procedure note injection Left knee - pes anserine   Verbal consent was obtained to inject the left knee - anteromedial, pes area Timeout was completed to confirm the site of injection.  The skin was prepped with alcohol and ethyl chloride was sprayed at the injection site.  A 21-gauge needle was used to inject 40 mg of Depo-Medrol and 1% lidocaine (3 cc) into the left knee using an anteromedial approach.  There were no complications. A sterile bandage was applied.     Body mass index is 48.35 kg/m.  Follow-up: Return if symptoms worsen or fail to improve.   Subjective:  Chief Complaint  Patient presents with  . Knee Pain    Left/ it hurts and the weather isn't help.    History of Present Illness: Gwendolyn Edwards is a 45 y.o. female who returns to clinic today for repeat evaluation of her left knee.  Briefly she sustained an injury at work almost 3 months ago.  MRI demonstrated a MCL sprain.  We recommended physical therapy, but due to the Worker's Compensation case, this was delayed.  Nonetheless, she has started physical therapy, notes worsening of her pain since the  onset of activities.  The pain is primary located in the medial aspect of the knee.  She has no instability.  No repeat injury.  Therapy is also recommending a brace for her.  Review of Systems: No fevers or chills No numbness or tingling No chest pain No shortness of breath No bowel or bladder dysfunction No GI distress No headaches   Objective: BP (!) 155/87   Pulse 82   Ht 5\' 2"  (1.575 m)   Wt 264 lb 6 oz (119.9 kg)   BMI 48.35 kg/m   Physical Exam:  Evaluation of left knee demonstrates no obvious deformity.  No gross instability is appreciated.  She has tenderness over the anterior medial aspect of the knee, in line with the pes anserine tendons.  There is no bruising in this area.  No tenderness along the medial joint line.  No tenderness along the lateral joint line.  IMAGING: I personally ordered and reviewed the following images:   No new imaging obtained today. , MD 07/06/2020 12:59 PM

## 2020-07-10 ENCOUNTER — Ambulatory Visit: Payer: Self-pay

## 2020-07-10 ENCOUNTER — Ambulatory Visit (HOSPITAL_COMMUNITY): Payer: Self-pay | Admitting: Physical Therapy

## 2020-07-10 ENCOUNTER — Telehealth (HOSPITAL_COMMUNITY): Payer: Self-pay | Admitting: Physical Therapy

## 2020-07-10 NOTE — Telephone Encounter (Signed)
Called patient regarding missed appointment.  States she is currently on quarantine waiting her test results from COVID test.   Lurena Nida, PTA/CLT [phone]

## 2020-07-13 ENCOUNTER — Ambulatory Visit (HOSPITAL_COMMUNITY): Payer: Self-pay

## 2020-07-13 ENCOUNTER — Telehealth (HOSPITAL_COMMUNITY): Payer: Self-pay

## 2020-07-13 NOTE — Telephone Encounter (Signed)
Called left message reminding of next appointment on 1/25 at 4:15   3:48 PM, 07/13/20 M. Shary Decamp, PT, DPT Physical Therapist- [place] Office Number: [phone]

## 2020-07-17 ENCOUNTER — Other Ambulatory Visit: Payer: Self-pay

## 2020-07-17 ENCOUNTER — Ambulatory Visit (HOSPITAL_COMMUNITY): Payer: Self-pay | Admitting: Physical Therapy

## 2020-07-17 DIAGNOSIS — M25662 Stiffness of left knee, not elsewhere classified: Secondary | ICD-10-CM

## 2020-07-17 DIAGNOSIS — M6281 Muscle weakness (generalized): Secondary | ICD-10-CM

## 2020-07-17 DIAGNOSIS — M25562 Pain in left knee: Secondary | ICD-10-CM

## 2020-07-17 NOTE — Therapy (Signed)
[facility] [address] [place], [place], [address] Phone: [phone]   Fax:  [phone]  Physical Therapy Treatment  Patient Details  Name: Gwendolyn Edwards MRN: [id number] Date of Birth: 05-27-1976 Referring Provider (PT): Dr. Thane Edu   Encounter Date: 07/17/2020   PT End of Session - 07/17/20 1658    Visit Number 4    Number of Visits 12    Date for PT Re-Evaluation 08/08/20    Authorization Type Worker's Comp    Authorization - Visit Number 4    Authorization - Number of Visits 12    Progress Note Due on Visit 10    PT Start Time 1615    PT Stop Time 1700    PT Time Calculation (min) 45 min    Activity Tolerance Patient limited by pain    Behavior During Therapy [facility] for tasks assessed/performed           Past Medical History:  Diagnosis Date  . Abscess of neck 2019  . Acid reflux   . Anxiety   . Essential hypertension   . Pericarditis    Possible pericarditis/myocarditis December 2020    Past Surgical History:  Procedure Laterality Date  . LEFT HEART CATH AND CORONARY ANGIOGRAPHY N/A 06/05/2019   Procedure: LEFT HEART CATH AND CORONARY ANGIOGRAPHY;  Surgeon: Tonny Bollman, MD;  Location: [facility] INVASIVE CV LAB;  Service: Cardiovascular;  Laterality: N/A;  . WISDOM TOOTH EXTRACTION      There were no vitals filed for this visit.   Subjective Assessment - 07/17/20 1618    Subjective pt states she did not get any benefit from the injection but is liking the brace that MD gave her.  STates she was in a hurry today and didnt put it on for work.  Comes today with pain of 3/10.   States she missed the last 2 appt due to sickness and her father in law passing.    Currently in Pain? Yes    Pain Score 3     Pain Location Knee    Pain Orientation Left    Pain Descriptors / Indicators Aching;Burning                             [facility] Adult PT Treatment/Exercise - 07/17/20 0001      Knee/Hip  Exercises: Standing   Heel Raises Both;20 reps    Knee Flexion 15 reps;Left    Forward Lunges Both;10 reps;Limitations    Forward Lunges Limitations onto 4" step    Lateral Step Up Both;10 reps;Hand Hold: 1;Step Height: 4"    Forward Step Up Both;10 reps;Hand Hold: 1;Step Height: 4"    Other Standing Knee Exercises side stepping no      Knee/Hip Exercises: Seated   Long Arc Quad Strengthening;Left;2 sets;10 reps                    PT Short Term Goals - 06/27/20 1648      PT SHORT TERM GOAL #1   Title Patient will report at least 25% improvement in symptoms for improved quality of life.    Time 3    Period Weeks    Status New    Target Date 07/18/20      PT SHORT TERM GOAL #2   Title Patient will be independent with HEP in order to improve functional outcomes.    Time 3  Period Weeks    Status New    Target Date 07/18/20      PT SHORT TERM GOAL #3   Title Patient will be able to ambulate at least 300 feet in in order to demonstrate improved gait speed for community ambulation with pain not exceeding 4/10 left knee    Baseline 250 with 8/10 left knee pain    Time 3    Period Weeks    Status New    Target Date 07/18/20      PT SHORT TERM GOAL #4   Title Patient will demo proper squat technique 5/5 observations to improve body mechanics and reduce risk for reinjury    Baseline poor    Time 3    Period Weeks    Status New    Target Date 07/18/20             PT Long Term Goals - 06/27/20 1650      PT LONG TERM GOAL #1   Title Patient will report at least 75% improvement in symptoms for improved quality of life.    Time 6    Period Weeks    Status New    Target Date 08/08/20      PT LONG TERM GOAL #2   Title Patient will improve on FOTO score to meet predicted outcomes to improve functional independence    Baseline 47% function    Time 6    Period Weeks    Status New    Target Date 08/08/20      PT LONG TERM GOAL #3   Title Patient will  manifest increased LLE strength and decrease in pain as evidenced by ability to climb/desend 10 stairs with reciprocal pattern    Baseline step-to-step    Time 6    Period Weeks    Status New    Target Date 08/08/20                 Plan - 07/17/20 1713    Clinical Impression Statement Pt with less pain overall so proceeded with standing therex this session.  REsumed sidestepping without resistance and cues for form.  Added standing LE stretches and step activities to increse strength.  Pt able to complete all exercises without complaints of pain.  Pt took 2 short seated rest breaks.   Encouraged to resume HEP as admits to not being compliant with this the past 2 weeks.    Personal Factors and Comorbidities Comorbidity 1;Profession;Time since onset of injury/illness/exacerbation    Comorbidities PMH    Examination-Activity Limitations Stairs;Squat;Locomotion Level;Stand;Lift    Examination-Participation Restrictions Community Activity;Occupation    Stability/Clinical Decision Making Stable/Uncomplicated    Rehab Potential Good    PT Frequency 2x / week    PT Duration 6 weeks    PT Treatment/Interventions ADLs/Self Care Home Management;Aquatic Therapy;Biofeedback;Cryotherapy;Electrical Stimulation;Ultrasound;Traction;Moist Heat;DME Instruction;Gait training;Stair training;Functional mobility training;Therapeutic activities;Therapeutic exercise;Patient/family education;Neuromuscular re-education;Balance training;Manual techniques;Taping;Energy conservation;Dry needling;Passive range of motion;Joint Manipulations    PT Next Visit Plan continue to progress as tolerated to increase strength and reduce pain. Progress to Squat form/lifting mechanics    PT Home Exercise Plan QS, heel slides, hamstring isometric           Patient will benefit from skilled therapeutic intervention in order to improve the following deficits and impairments:  Abnormal gait,Decreased activity tolerance,Decreased  balance,Decreased knowledge of precautions,Decreased endurance,Decreased knowledge of use of DME,Decreased mobility,Decreased range of motion,Difficulty walking,Decreased strength,Impaired perceived functional ability,Obesity,Improper body mechanics,Pain  Visit Diagnosis: Left  knee pain, unspecified chronicity  Muscle weakness (generalized)  Stiffness of left knee, not elsewhere classified     Problem List Patient Active Problem List   Diagnosis Date Noted  . Essential hypertension   . Acid reflux   . Anxiety   . Pericarditis 06/08/2019  . Chest pain at rest 06/05/2019  . Hypertensive heart disease 12/01/2018  . Screening for colorectal cancer 12/01/2018  . Encounter for gynecological examination with Papanicolaou smear of cervix 12/01/2018  . Abscess of neck 02/08/2018   Lurena Nida, PTA/CLT [phone]  Lurena Nida 07/17/2020, 5:17 PM  [facility] [address] [place], [place], [address] Phone: [phone]   Fax:  [phone]  Name: ZELA SOBIESKI MRN: [id number] Date of Birth: 1976-04-20

## 2020-07-19 ENCOUNTER — Telehealth (HOSPITAL_COMMUNITY): Payer: Self-pay | Admitting: Physical Therapy

## 2020-07-19 ENCOUNTER — Ambulatory Visit (HOSPITAL_COMMUNITY): Payer: Self-pay | Admitting: Physical Therapy

## 2020-07-19 NOTE — Telephone Encounter (Signed)
Pt did not show for appointment.  Called and left message regarding missed appointment and reminder for next scheduled appt next Tuesday at 4pm.  Lurena Nida, PTA/CLT [phone]

## 2020-07-24 ENCOUNTER — Ambulatory Visit (HOSPITAL_COMMUNITY): Payer: Self-pay | Attending: Orthopedic Surgery

## 2020-07-24 ENCOUNTER — Telehealth (HOSPITAL_COMMUNITY): Payer: Self-pay

## 2020-07-24 DIAGNOSIS — B029 Zoster without complications: Secondary | ICD-10-CM

## 2020-07-24 HISTORY — DX: Zoster without complications: B02.9

## 2020-07-24 NOTE — Telephone Encounter (Signed)
Called mobile phone, unable to leave voicemail.  4:17 PM, 07/24/20 M. Shary Decamp, PT, DPT Physical Therapist- [place] Office Number: [phone]

## 2020-07-27 ENCOUNTER — Telehealth (HOSPITAL_COMMUNITY): Payer: Self-pay

## 2020-07-27 ENCOUNTER — Ambulatory Visit (HOSPITAL_COMMUNITY): Payer: Self-pay

## 2020-07-27 NOTE — Telephone Encounter (Signed)
Called and left message regarding missed appointment and informed of next appointment date/time.  4:18 PM, 07/27/20 M. Shary Decamp, PT, DPT Physical Therapist- [place] Office Number: [phone]

## 2020-07-31 ENCOUNTER — Ambulatory Visit (HOSPITAL_COMMUNITY): Payer: Self-pay

## 2020-07-31 ENCOUNTER — Telehealth (HOSPITAL_COMMUNITY): Payer: Self-pay

## 2020-07-31 NOTE — Telephone Encounter (Signed)
Patient no call, no show for appointment.  Unable to leave message due to mailbox not set-up. 4:46 PM, 07/31/20 M. Shary Decamp, PT, DPT Physical Therapist- [place] Office Number: [phone]

## 2020-08-03 ENCOUNTER — Emergency Department: Payer: Self-pay

## 2020-08-03 ENCOUNTER — Emergency Department
Admission: EM | Admit: 2020-08-03 | Discharge: 2020-08-03 | Disposition: A | Discharge status: Home / Self Care | Payer: Self-pay | Attending: Emergency Medicine | Admitting: Emergency Medicine

## 2020-08-03 ENCOUNTER — Other Ambulatory Visit: Payer: Self-pay

## 2020-08-03 ENCOUNTER — Ambulatory Visit (HOSPITAL_COMMUNITY): Payer: Self-pay

## 2020-08-03 ENCOUNTER — Encounter: Payer: Self-pay | Admitting: Emergency Medicine

## 2020-08-03 DIAGNOSIS — R519 Headache, unspecified: Secondary | ICD-10-CM | POA: Insufficient documentation

## 2020-08-03 DIAGNOSIS — Z79899 Other long term (current) drug therapy: Secondary | ICD-10-CM | POA: Insufficient documentation

## 2020-08-03 DIAGNOSIS — L03213 Periorbital cellulitis: Secondary | ICD-10-CM | POA: Insufficient documentation

## 2020-08-03 DIAGNOSIS — I10 Essential (primary) hypertension: Secondary | ICD-10-CM | POA: Insufficient documentation

## 2020-08-03 LAB — CBC WITH DIFFERENTIAL/PLATELET
Abs Immature Granulocytes: 0.03 10*3/uL (ref 0.00–0.07)
Basophils Absolute: 0 10*3/uL (ref 0.0–0.1)
Basophils Relative: 1 %
Eosinophils Absolute: 0.3 10*3/uL (ref 0.0–0.5)
Eosinophils Relative: 4 %
HCT: 39.5 % (ref 36.0–46.0)
Hemoglobin: 12.6 g/dL (ref 12.0–15.0)
Immature Granulocytes: 0 %
Lymphocytes Relative: 35 %
Lymphs Abs: 2.9 10*3/uL (ref 0.7–4.0)
MCH: 26.6 pg (ref 26.0–34.0)
MCHC: 31.9 g/dL (ref 30.0–36.0)
MCV: 83.5 fL (ref 80.0–100.0)
Monocytes Absolute: 0.7 10*3/uL (ref 0.1–1.0)
Monocytes Relative: 8 %
Neutro Abs: 4.4 10*3/uL (ref 1.7–7.7)
Neutrophils Relative %: 52 %
Platelets: 335 10*3/uL (ref 150–400)
RBC: 4.73 MIL/uL (ref 3.87–5.11)
RDW: 15.4 % (ref 11.5–15.5)
WBC: 8.3 10*3/uL (ref 4.0–10.5)
nRBC: 0 % (ref 0.0–0.2)

## 2020-08-03 LAB — COMPREHENSIVE METABOLIC PANEL
ALT: 13 U/L (ref 0–44)
AST: 15 U/L (ref 15–41)
Albumin: 4.1 g/dL (ref 3.5–5.0)
Alkaline Phosphatase: 86 U/L (ref 38–126)
Anion gap: 11 (ref 5–15)
BUN: 13 mg/dL (ref 6–20)
CO2: 27 mmol/L (ref 22–32)
Calcium: 9.4 mg/dL (ref 8.9–10.3)
Chloride: 99 mmol/L (ref 98–111)
Creatinine, Ser: 0.85 mg/dL (ref 0.44–1.00)
GFR, Estimated: >60 mL/min (ref >60)
Glucose, Bld: 99 mg/dL (ref 70–99)
Potassium: 3.5 mmol/L (ref 3.5–5.1)
Sodium: 137 mmol/L (ref 135–145)
Total Bilirubin: 0.3 mg/dL (ref 0.3–1.2)
Total Protein: 8.6 g/dL — ABNORMAL HIGH (ref 6.5–8.1)

## 2020-08-03 MED ORDER — CEFDINIR 300 MG PO CAPS
300.0000 mg | ORAL_CAPSULE | Freq: Two times a day (BID) | ORAL | 0 refills | Status: AC
Start: 2020-08-03 — End: 2020-08-10

## 2020-08-03 MED ORDER — FLUCONAZOLE 150 MG PO TABS
ORAL_TABLET | ORAL | 0 refills | Status: DC
Start: 2020-08-03 — End: 2020-12-17

## 2020-08-03 MED ORDER — SULFAMETHOXAZOLE-TRIMETHOPRIM 800-160 MG PO TABS
1.0000 | ORAL_TABLET | Freq: Two times a day (BID) | ORAL | 0 refills | Status: AC
Start: 2020-08-03 — End: 2020-08-10

## 2020-08-03 MED ORDER — IOHEXOL 300 MG/ML  SOLN
75.0000 mL | Freq: Once | INTRAMUSCULAR | Status: AC | PRN
  Administered 2020-08-03: 75 mL via INTRAVENOUS
  Filled 2020-08-03: qty 75

## 2020-08-03 MED ORDER — SODIUM CHLORIDE 0.9 % IV SOLN
1.0000 g | Freq: Once | INTRAVENOUS | Status: AC
  Administered 2020-08-03: 1 g via INTRAVENOUS
  Filled 2020-08-03: qty 10

## 2020-08-03 MED ORDER — ACETAMINOPHEN 325 MG PO TABS
650.0000 mg | ORAL_TABLET | Freq: Once | ORAL | Status: AC
  Administered 2020-08-03: 650 mg via ORAL
  Filled 2020-08-03: qty 2

## 2020-08-03 MED ORDER — SULFAMETHOXAZOLE-TRIMETHOPRIM 800-160 MG PO TABS
1.0000 | ORAL_TABLET | Freq: Once | ORAL | Status: AC
  Administered 2020-08-03: 1 via ORAL
  Filled 2020-08-03: qty 1

## 2020-08-03 NOTE — ED Notes (Signed)
Pt c/o abscess on right eyebrow, right eyelid, and swelling and pressure in right jaw and ear x2 days. 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 Abscess over right eyebrow is red, swelling noted, pt c/o tenderness to touch. Pt denies vision changed, states eye is weeping. No discharge is noted.

## 2020-08-03 NOTE — ED Notes (Signed)
Pt given snack per request.

## 2020-08-03 NOTE — ED Notes (Signed)
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 No active drainage noted. Pt noted to have swelling above the right eye with a small opening/cut within the swelling. Pt states it was a paper cut that caused that.

## 2020-08-03 NOTE — ED Triage Notes (Signed)
Pt to ED via POV with c/o abscess to R eyebrow x 2 days. Pt states pain radiates across forehead to posterior R ear.

## 2020-08-04 NOTE — ED Provider Notes (Signed)
[facility] Emergency Department Provider Note  ____________________________________________   Event Date/Time   First MD Initiated Contact with Patient 08/03/20 1551     (approximate)  I have reviewed the triage vital signs and the nursing notes.   HISTORY  Chief Complaint Abscess  HPI Gwendolyn Edwards is a 45 y.o. female who presents to the emergency department for evaluation of facial abscess.  Patient states that 2 days ago, she noticed a small bump, just superior to the medial aspect of her right eyebrow.  She states that over the last 2 days, it has grown significantly.  She noticed this morning she had some erythematous extension below the eyebrow towards the right eyelid.  She describes pain across the entire right aspect of her forehead, into her right ear and jawline.  She endorses pain with movement of her eyes.  She has tried over-the-counter medications with little relief.  She endorses pain of a 10/10.  She denies fever, neck pain, chest pain or other systemic symptoms.         Past Medical History:  Diagnosis Date  . Abscess of neck 2019  . Acid reflux   . Anxiety   . Essential hypertension   . Pericarditis    Possible pericarditis/myocarditis December 2020    Patient Active Problem List   Diagnosis Date Noted  . Essential hypertension   . Acid reflux   . Anxiety   . Pericarditis 06/08/2019  . Chest pain at rest 06/05/2019  . Hypertensive heart disease 12/01/2018  . Screening for colorectal cancer 12/01/2018  . Encounter for gynecological examination with Papanicolaou smear of cervix 12/01/2018  . Abscess of neck 02/08/2018    Past Surgical History:  Procedure Laterality Date  . LEFT HEART CATH AND CORONARY ANGIOGRAPHY N/A 06/05/2019   Procedure: LEFT HEART CATH AND CORONARY ANGIOGRAPHY;  Surgeon: Tonny Bollman, MD;  Location: [facility] INVASIVE CV LAB;  Service: Cardiovascular;  Laterality: N/A;  . WISDOM TOOTH EXTRACTION       Prior to Admission medications   Medication Sig Start Date End Date Taking? Authorizing Provider  cefdinir (OMNICEF) 300 MG capsule Take 1 capsule (300 mg total) by mouth 2 (two) times daily for 7 days. 08/03/20 08/10/20 Yes Josehua Hammar, Ruben Gottron, PA  fluconazole (DIFLUCAN) 150 MG tablet Take 1 tablet by mouth. Repeat dose in 72 hours if symptoms persist. 08/03/20  Yes Vora Clover, Ruben Gottron, PA  sulfamethoxazole-trimethoprim (BACTRIM DS) 800-160 MG tablet Take 1 tablet by mouth 2 (two) times daily for 7 days. 08/03/20 08/10/20 Yes Azure Barrales, Ruben Gottron, PA  amLODipine (NORVASC) 5 MG tablet Take 5 mg by mouth daily. 05/21/19   [provider]  BIOTIN PO Take 1 tablet by mouth daily.    [provider]  diclofenac (VOLTAREN) 75 MG EC tablet Take 1 tablet (75 mg total) by mouth 2 (two) times daily with a meal. 05/23/20   Oliver Barre, MD  famotidine (PEPCID) 20 MG tablet Take 20 mg by mouth daily.     [provider]  furosemide (LASIX) 20 MG tablet Take 20 mg by mouth daily as needed for fluid.  09/09/19   [provider]  losartan-hydrochlorothiazide (HYZAAR) 100-25 MG tablet Take 1 tablet by mouth daily.    [provider]  metoprolol succinate (TOPROL-XL) 50 MG 24 hr tablet Take 1 tablet (50 mg total) by mouth daily. 07/01/19   Berton Bon, NP  Multiple Vitamin (MULTIVITAMIN PO) Take 1 tablet by mouth daily.  [provider]  oxyCODONE-acetaminophen (PERCOCET/ROXICET) 5-325 MG tablet Take 1-2 tablets by mouth every 6 (six) hours as needed for severe pain (hold tylenol if using percocet). 04/18/20   Gailen Shelter, PA  venlafaxine XR (EFFEXOR-XR) 75 MG 24 hr capsule Take 75 mg by mouth daily with breakfast.    [provider]    Allergies Doxycycline and Augmentin [amoxicillin-pot clavulanate]  Family History  Problem Relation Age of Onset  . Kidney disease Mother   . Hepatitis C Mother   . Hypertension Mother   . Cancer Father         Osteosarcoma   . Thyroid disease Sister     Social History Social History   Tobacco Use  . Smoking status: Never Smoker  . Smokeless tobacco: Never Used  Vaping Use  . Vaping Use: Never used  Substance Use Topics  . Alcohol use: No  . Drug use: No    Review of Systems Constitutional: No fever/chills Eyes: + Pain with movement of eyes, no visual changes. ENT: No sore throat. Cardiovascular: Denies chest pain. Respiratory: Denies shortness of breath. Gastrointestinal: No abdominal pain.  No nausea, no vomiting.  No diarrhea.  No constipation. Genitourinary: Negative for dysuria. Musculoskeletal: Negative for back pain. Skin: + Abscess of right forehead, negative for rash. Neurological: + headaches, negative for focal weakness or numbness. ____________________________________________   PHYSICAL EXAM:  VITAL SIGNS: ED Triage Vitals  Enc Vitals Group     BP 08/03/20 1528 (!) 129/92     Pulse Rate 08/03/20 1528 84     Resp 08/03/20 1528 20     Temp 08/03/20 1528 98.3 F (36.8 C)     Temp Source 08/03/20 1528 Oral     SpO2 08/03/20 1528 97 %     Weight 08/03/20 1531 261 lb (118.4 kg)     Height 08/03/20 1531 5\' 3"  (1.6 m)     Head Circumference --      Peak Flow --      Pain Score 08/03/20 1531 10     Pain Loc --      Pain Edu? --      Excl. in GC? --    Constitutional: Alert and oriented. Well appearing and in no acute distress. Eyes: Conjunctivae are normal.  There is a 1.5 cm x 1.5 cm raised erythematous lesion just superior to the medial aspect of the right eyebrow.  It is indurated with minimal fluctuance.  There is some mild erythematous extension below the eyebrow towards the right eyelid.  EOMs are intact but with endorsed pain, worse with looking medially of the right eye.  PERRL.  Head: Atraumatic. Nose: No congestion/rhinnorhea. Mouth/Throat: Mucous membranes are moist.  Oropharynx non-erythematous. Neck: No stridor.  Lymphatic: No cervical  lymphadenopathy. Cardiovascular: Normal rate, regular rhythm. Grossly normal heart sounds.  Good peripheral circulation. Respiratory: Normal respiratory effort.  No retractions. Lungs CTAB. Gastrointestinal: Soft and nontender. No distention. No abdominal bruits. No CVA tenderness. Musculoskeletal: No lower extremity tenderness nor edema.  No joint effusions. Neurologic:  Normal speech and language. No gross focal neurologic deficits are appreciated. No gait instability. Skin:  Skin is warm, dry and intact.  Rash as described above. Psychiatric: Mood and affect are normal. Speech and behavior are normal.  ____________________________________________   LABS (all labs ordered are listed, but only abnormal results are displayed)  Labs Reviewed  COMPREHENSIVE METABOLIC PANEL - Abnormal; Notable for the following components:      Result Value  Total Protein 8.6 (*)    All other components within normal limits  CBC WITH DIFFERENTIAL/PLATELET    ____________________________________________  RADIOLOGY  Maxillofacial CT with contrast demonstrates right frontal and preseptal stranding consistent with cellulitis.  There is no definite drainable abscess and no evidence of post septal involvement.  ____________________________________________   INITIAL IMPRESSION / ASSESSMENT AND PLAN / ED COURSE  As part of my medical decision making, I reviewed the following data within the electronic MEDICAL RECORD NUMBER Nursing notes reviewed and incorporated, Labs reviewed, Notes from prior ED visits and [place] Controlled Substance Database        Patient is a 45 year old female who presents to the emergency department for evaluation of headache, eye pain and abscess to right forehead.  See HPI for further details.  In triage, the patient has normal vital signs.  On physical exam, there is a raised erythematous region of the right forehead just superior to the right eyebrow with extension and erythema below  the eyebrow.  The patient is endorsing pain with movement of the eyes, raising concern for post septal involvement.  Labs were performed which do not demonstrate any leukocytosis or other significant abnormalities.  CT with contrast was obtained to evaluate for potential abscess.  There was no definitive abscess, but was consistent with frontal and preseptal cellulitis with no evidence of post septal involvement.  We will begin treatment for the patient with 1 dose of IV Rocephin here as well as outpatient Bactrim and cefdinir given her Augmentin allergy.  Is amenable with this plan.  She was given return precautions.  She is stable at this time for outpatient therapy.      ____________________________________________   FINAL CLINICAL IMPRESSION(S) / ED DIAGNOSES  Final diagnoses:  Preseptal cellulitis of right eye     ED Discharge Orders         Ordered    cefdinir (OMNICEF) 300 MG capsule  2 times daily        08/03/20 1816    sulfamethoxazole-trimethoprim (BACTRIM DS) 800-160 MG tablet  2 times daily        08/03/20 1816    fluconazole (DIFLUCAN) 150 MG tablet        08/03/20 1816          *Please note:  Gwendolyn Edwards was evaluated in Emergency Department on 08/04/2020 for the symptoms described in the history of present illness. She was evaluated in the context of the global COVID-19 pandemic, which necessitated consideration that the patient might be at risk for infection with the SARS-CoV-2 virus that causes COVID-19. Institutional protocols and algorithms that pertain to the evaluation of patients at risk for COVID-19 are in a state of rapid change based on information released by regulatory bodies including the CDC and federal and state organizations. These policies and algorithms were followed during the patient's care in the ED.  Some ED evaluations and interventions may be delayed as a result of limited staffing during and the pandemic.*   Note:  This document was  prepared using Dragon voice recognition software and may include unintentional dictation errors.   Lucy Chris, PA 08/04/20 [id number]    Shaune Pollack, MD 08/06/20 2029

## 2020-08-05 ENCOUNTER — Encounter (HOSPITAL_COMMUNITY): Payer: Self-pay | Admitting: Emergency Medicine

## 2020-08-05 ENCOUNTER — Emergency Department (HOSPITAL_COMMUNITY)
Admission: EM | Admit: 2020-08-05 | Discharge: 2020-08-05 | Disposition: A | Discharge status: Home / Self Care | Payer: Self-pay | Attending: Emergency Medicine | Admitting: Emergency Medicine

## 2020-08-05 ENCOUNTER — Other Ambulatory Visit: Payer: Self-pay

## 2020-08-05 DIAGNOSIS — L03211 Cellulitis of face: Secondary | ICD-10-CM | POA: Insufficient documentation

## 2020-08-05 DIAGNOSIS — Z79899 Other long term (current) drug therapy: Secondary | ICD-10-CM | POA: Insufficient documentation

## 2020-08-05 DIAGNOSIS — I10 Essential (primary) hypertension: Secondary | ICD-10-CM | POA: Insufficient documentation

## 2020-08-05 DIAGNOSIS — T63301D Toxic effect of unspecified spider venom, accidental (unintentional), subsequent encounter: Secondary | ICD-10-CM | POA: Insufficient documentation

## 2020-08-05 DIAGNOSIS — Z9861 Coronary angioplasty status: Secondary | ICD-10-CM | POA: Insufficient documentation

## 2020-08-05 LAB — CBC WITH DIFFERENTIAL/PLATELET
Abs Immature Granulocytes: 0.01 10*3/uL (ref 0.00–0.07)
Basophils Absolute: 0 10*3/uL (ref 0.0–0.1)
Basophils Relative: 1 %
Eosinophils Absolute: 0.2 10*3/uL (ref 0.0–0.5)
Eosinophils Relative: 4 %
HCT: 39 % (ref 36.0–46.0)
Hemoglobin: 12.2 g/dL (ref 12.0–15.0)
Immature Granulocytes: 0 %
Lymphocytes Relative: 45 %
Lymphs Abs: 2.6 10*3/uL (ref 0.7–4.0)
MCH: 26.8 pg (ref 26.0–34.0)
MCHC: 31.3 g/dL (ref 30.0–36.0)
MCV: 85.5 fL (ref 80.0–100.0)
Monocytes Absolute: 0.5 10*3/uL (ref 0.1–1.0)
Monocytes Relative: 9 %
Neutro Abs: 2.4 10*3/uL (ref 1.7–7.7)
Neutrophils Relative %: 41 %
Platelets: 274 10*3/uL (ref 150–400)
RBC: 4.56 MIL/uL (ref 3.87–5.11)
RDW: 15.8 % — ABNORMAL HIGH (ref 11.5–15.5)
WBC: 5.8 10*3/uL (ref 4.0–10.5)
nRBC: 0 % (ref 0.0–0.2)

## 2020-08-05 LAB — BASIC METABOLIC PANEL
Anion gap: 8 (ref 5–15)
BUN: 11 mg/dL (ref 6–20)
CO2: 23 mmol/L (ref 22–32)
Calcium: 8.5 mg/dL — ABNORMAL LOW (ref 8.9–10.3)
Chloride: 105 mmol/L (ref 98–111)
Creatinine, Ser: 0.91 mg/dL (ref 0.44–1.00)
GFR, Estimated: >60 mL/min (ref >60)
Glucose, Bld: 109 mg/dL — ABNORMAL HIGH (ref 70–99)
Potassium: 3.7 mmol/L (ref 3.5–5.1)
Sodium: 136 mmol/L (ref 135–145)

## 2020-08-05 LAB — I-STAT BETA HCG BLOOD, ED (MC, WL, AP ONLY): I-stat hCG, quantitative: <5 m[IU]/mL (ref <5)

## 2020-08-05 MED ORDER — HYDROCODONE-ACETAMINOPHEN 5-325 MG PO TABS: 1.0000 | ORAL_TABLET | Freq: Four times a day (QID) | ORAL | 0 refills | Status: DC | PRN

## 2020-08-05 MED ORDER — KETOROLAC TROMETHAMINE 30 MG/ML IJ SOLN
30.0000 mg | Freq: Once | INTRAMUSCULAR | Status: AC
  Administered 2020-08-05: 30 mg via INTRAVENOUS
  Filled 2020-08-05: qty 1

## 2020-08-05 NOTE — Discharge Instructions (Addendum)
Please take ibuprofen 600 mg every 6 hours as needed for inflammation and pain symptoms.  You may supplement with Tylenol.  I also prescribed you a few Vicodin pills that you can take for breakthrough pain, particularly at night. Do not drive. Do not use machinery or power tools. Do not sign legal documents. Do not drink alcohol. Do not take sleeping pills. Do not supervise children by yourself. Do not participate in activities that require climbing or being in high places.  Your laboratory work-up and physical exam today was largely reassuring.  Do not feel as though repeat CT imaging is emergently warranted.  I suspect that her symptoms are still reflective of cellulitis and I would like you to continue with your antibiotics, as directed.  Please follow-up with your primary care provider tomorrow regarding today's encounter and for ongoing evaluation and management.  Return to the ED or seek immediate medical attention should you experience any fevers, worsening pain with eye movement, eye bulging, or any other new or worsening symptoms.

## 2020-08-05 NOTE — ED Triage Notes (Signed)
Pt presents with worsening swelling and pain from cellulitis to her right side of face.
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
## 2020-08-07 ENCOUNTER — Encounter (HOSPITAL_COMMUNITY): Payer: Self-pay

## 2020-08-07 ENCOUNTER — Telehealth (HOSPITAL_COMMUNITY): Payer: Self-pay | Admitting: Speech Pathology

## 2020-08-07 NOTE — Telephone Encounter (Signed)
Dad called he had to pick [place] up from school he is sick. Dad will call MD today and make an appointment.

## 2020-08-07 NOTE — Therapy (Signed)
[facility] [address] [place], [place], [address] Phone: [phone]   Fax:  [phone]  Patient Details  Name: Gwendolyn Edwards MRN: [id number] Date of Birth: 1976-02-18 Referring Provider:  No ref. provider found  PHYSICAL THERAPY DISCHARGE SUMMARY  Visits from Start of Care: 4  Current functional level related to goals / functional outcomes: Patient did not return after 4th PT visit on 07/17/20   Remaining deficits: Unable to assess   Education / Equipment: Provided with HEP and updated each visit to 4th visit Plan: Patient agrees to discharge.  Patient goals were not met. Patient is being discharged due to not returning since the last visit.  ?????      Encounter Date: 08/07/2020   11:27 AM, 08/07/20 M. Sherlyn Lees, PT, DPT Physical Therapist- [place] Office Number: [phone]  [facility] [address] [place], [place], [address] Phone: [phone]   Fax:  [phone]

## 2020-08-10 LAB — CULTURE, BLOOD (ROUTINE X 2)
Culture: NO GROWTH
Culture: NO GROWTH

## 2020-11-16 ENCOUNTER — Other Ambulatory Visit: Payer: Self-pay | Admitting: Adult Health

## 2020-12-03 ENCOUNTER — Other Ambulatory Visit: Payer: Self-pay | Admitting: *Deleted

## 2020-12-17 ENCOUNTER — Other Ambulatory Visit: Payer: Self-pay

## 2020-12-17 ENCOUNTER — Encounter (INDEPENDENT_AMBULATORY_CARE_PROVIDER_SITE_OTHER): Payer: Self-pay | Admitting: *Deleted

## 2020-12-17 ENCOUNTER — Other Ambulatory Visit (HOSPITAL_COMMUNITY)
Admission: RE | Admit: 2020-12-17 | Discharge: 2020-12-17 | Disposition: A | Discharge status: Home / Self Care | Payer: 59 | Source: Ambulatory Visit | Attending: Adult Health | Admitting: Adult Health

## 2020-12-17 ENCOUNTER — Encounter: Payer: Self-pay | Admitting: Adult Health

## 2020-12-17 ENCOUNTER — Ambulatory Visit (INDEPENDENT_AMBULATORY_CARE_PROVIDER_SITE_OTHER): Payer: 59 | Admitting: Adult Health

## 2020-12-17 VITALS — BP 114/73 | HR 87 | Ht 63.0 in | Wt 275.0 lb

## 2020-12-17 DIAGNOSIS — Z319 Encounter for procreative management, unspecified: Secondary | ICD-10-CM | POA: Diagnosis not present

## 2020-12-17 DIAGNOSIS — Z01419 Encounter for gynecological examination (general) (routine) without abnormal findings: Secondary | ICD-10-CM | POA: Diagnosis not present

## 2020-12-17 DIAGNOSIS — Z1211 Encounter for screening for malignant neoplasm of colon: Secondary | ICD-10-CM | POA: Diagnosis not present

## 2020-12-17 DIAGNOSIS — Z1231 Encounter for screening mammogram for malignant neoplasm of breast: Secondary | ICD-10-CM | POA: Diagnosis not present

## 2020-12-17 DIAGNOSIS — Z1212 Encounter for screening for malignant neoplasm of rectum: Secondary | ICD-10-CM

## 2020-12-17 LAB — HEMOCCULT GUIAC POC 1CARD (OFFICE): Fecal Occult Blood, POC: NEGATIVE

## 2020-12-17 NOTE — Progress Notes (Signed)
Patient ID: Gwendolyn Edwards, female   DOB: 07/20/1975, 45 y.o.   MRN: [id number] History of Present Illness: Gwendolyn Edwards is a 45 year old black female,married, [id number] in for a well woman gyn exam and pap. Works a [employer]. PCP is Dr Sherril Croon.   Current Medications, Allergies, Past Medical History, Past Surgical History, Family History and Social History were reviewed in [employer] record.     Review of Systems: Patient denies any headaches, hearing loss, fatigue, blurred vision, shortness of breath, chest pain, abdominal pain, problems with bowel movements, urination, or intercourse. No joint pain or mood swings.  Periods regular, and wants another baby, used 4 rounds of Femara with last child    Physical Exam:BP 114/73 (BP Location: Left Arm, Patient Position: Sitting, Cuff Size: Normal)   Pulse 87   Ht 5\' 3"  (1.6 m)   Wt 275 lb (124.7 kg)   LMP 11/13/2020   BMI 48.71 kg/m   General:  Well developed, well nourished, no acute distress Skin:  Warm and dry Neck:  Midline trachea, normal thyroid, good ROM, no lymphadenopathy Lungs; Clear to auscultation bilaterally Breast:  No dominant palpable mass, retraction, or nipple discharge Cardiovascular: Regular rate and rhythm Abdomen:  Soft, non tender, no hepatosplenomegaly Pelvic:  External genitalia is normal in appearance, no lesions.  The vagina is normal in appearance. Urethra has no lesions or masses. The cervix is bulbous.Pap with HR HPV genotyping performed.  Uterus is felt to be normal size, shape, and contour.  No adnexal masses or tenderness noted.Bladder is non tender, no masses felt. Rectal: Good sphincter tone, no polyps, or hemorrhoids felt.  Hemoccult negative. Extremities/musculoskeletal:  No swelling or varicosities noted, no clubbing or cyanosis Psych:  No mood changes, alert and cooperative,seems happy AA is 3 Fall risk is low Depression screen [facility] 2/9 12/17/2020 12/01/2018  Decreased Interest 0  0  Down, Depressed, Hopeless 0 0  PHQ - 2 Score 0 0  Altered sleeping 0 -  Tired, decreased energy 1 -  Change in appetite 0 -  Feeling bad or failure about yourself  0 -  Trouble concentrating 0 -  Moving slowly or fidgety/restless 0 -  Suicidal thoughts 0 -  PHQ-9 Score 1 -    GAD 7 : Generalized Anxiety Score 12/17/2020  Nervous, Anxious, on Edge 0  Control/stop worrying 0  Worry too much - different things 0  Trouble relaxing 0  Restless 0  Easily annoyed or irritable 1  Afraid - awful might happen 0  Total GAD 7 Score 1      Upstream - 12/17/20 1134       Pregnancy Intention Screening   Does the patient want to become pregnant in the next year? Yes    Does the patient's partner want to become pregnant in the next year? Yes    Would the patient like to discuss contraceptive options today? No      Contraception Wrap Up   Current Method Pregnant/Seeking Pregnancy    End Method Pregnant/Seeking Pregnancy    Contraception Counseling Provided No            Examination chaperoned by 12/19/20 LPN  Impression and plan: 1. Encounter for gynecological examination with Papanicolaou smear of cervix Pap sent Physical in 1 year Pap in 3 if normal Labs with PCP - Cytology - PAP( [place])  2. Encounter for screening fecal occult blood testing - POCT occult blood stool  3. Patient desires pregnancy [place]  rgiven for [facility] for Reproductive medicine   4. Screening mammogram for breast cancer Call the [facility] in [place] for appt. - MM 3D SCREEN BREAST BILATERAL; Future  5. Screening for colorectal cancer Referred to Dr Patty Sermons office for colonoscopy  - Ambulatory referral to Gastroenterology

## 2020-12-19 LAB — CYTOLOGY - PAP
Comment: NEGATIVE
Diagnosis: NEGATIVE
High risk HPV: NEGATIVE

## 2021-01-02 DIAGNOSIS — I10 Essential (primary) hypertension: Secondary | ICD-10-CM | POA: Diagnosis not present

## 2021-01-02 DIAGNOSIS — Z299 Encounter for prophylactic measures, unspecified: Secondary | ICD-10-CM | POA: Diagnosis not present

## 2021-01-02 DIAGNOSIS — Z0000 Encounter for general adult medical examination without abnormal findings: Secondary | ICD-10-CM | POA: Diagnosis not present

## 2021-01-02 DIAGNOSIS — Z789 Other specified health status: Secondary | ICD-10-CM | POA: Diagnosis not present

## 2021-01-02 DIAGNOSIS — F419 Anxiety disorder, unspecified: Secondary | ICD-10-CM | POA: Diagnosis not present

## 2021-01-02 DIAGNOSIS — Z6841 Body Mass Index (BMI) 40.0 and over, adult: Secondary | ICD-10-CM | POA: Diagnosis not present

## 2021-01-02 DIAGNOSIS — Z1331 Encounter for screening for depression: Secondary | ICD-10-CM | POA: Diagnosis not present

## 2021-01-07 ENCOUNTER — Other Ambulatory Visit (HOSPITAL_COMMUNITY): Payer: Self-pay

## 2021-01-07 MED ORDER — FAMOTIDINE 20 MG PO TABS: 20.0000 mg | ORAL_TABLET | Freq: Every day | ORAL | 0 refills | Status: DC

## 2021-01-07 MED ORDER — PANTOPRAZOLE SODIUM 40 MG PO TBEC
40.0000 mg | DELAYED_RELEASE_TABLET | Freq: Every day | ORAL | 3 refills | Status: DC
  Filled 2021-05-11: qty 90, 90d supply, fill #0
  Filled 2021-06-13 – 2021-10-14 (×2): qty 90, 90d supply, fill #1
  Filled 2021-10-15: qty 90, 90d supply, fill #2

## 2021-01-07 MED ORDER — VENLAFAXINE HCL ER 150 MG PO CP24
150.0000 mg | ORAL_CAPSULE | Freq: Every day | ORAL | 11 refills | Status: DC
  Filled 2021-01-07 – 2021-02-04 (×2): qty 30, 30d supply, fill #0
  Filled 2021-03-25: qty 30, 30d supply, fill #1
  Filled 2021-05-11: qty 30, 30d supply, fill #2
  Filled 2021-06-13: qty 30, 30d supply, fill #3
  Filled 2021-08-05: qty 30, 30d supply, fill #4
  Filled 2021-09-13: qty 30, 30d supply, fill #5
  Filled 2021-11-09: qty 30, 30d supply, fill #6
  Filled 2021-12-12: qty 30, 30d supply, fill #7

## 2021-01-07 MED ORDER — AMLODIPINE BESYLATE 5 MG PO TABS
5.0000 mg | ORAL_TABLET | Freq: Every day | ORAL | 1 refills | Status: DC
  Filled 2021-02-04: qty 90, 90d supply, fill #0
  Filled 2021-06-13: qty 90, 90d supply, fill #1

## 2021-01-07 MED ORDER — FAMOTIDINE 20 MG PO TABS: 20.0000 mg | ORAL_TABLET | Freq: Every day | ORAL | 3 refills | Status: DC

## 2021-01-16 ENCOUNTER — Other Ambulatory Visit (HOSPITAL_COMMUNITY): Payer: Self-pay

## 2021-01-16 MED ORDER — GABAPENTIN 300 MG PO CAPS
300.0000 mg | ORAL_CAPSULE | Freq: Two times a day (BID) | ORAL | 1 refills | Status: DC
  Filled 2021-01-16 – 2021-02-28 (×2): qty 60, 30d supply, fill #0
  Filled 2021-03-25: qty 60, 30d supply, fill #1

## 2021-01-17 ENCOUNTER — Other Ambulatory Visit (HOSPITAL_COMMUNITY): Payer: Self-pay

## 2021-01-21 ENCOUNTER — Other Ambulatory Visit (HOSPITAL_COMMUNITY): Payer: Self-pay

## 2021-01-21 MED ORDER — GABAPENTIN 300 MG PO CAPS
ORAL_CAPSULE | ORAL | 2 refills | Status: DC
  Filled 2021-01-21 (×2): qty 90, 30d supply, fill #0

## 2021-02-04 ENCOUNTER — Other Ambulatory Visit: Payer: Self-pay

## 2021-02-04 ENCOUNTER — Other Ambulatory Visit (HOSPITAL_COMMUNITY): Payer: Self-pay

## 2021-02-05 ENCOUNTER — Other Ambulatory Visit (HOSPITAL_COMMUNITY): Payer: Self-pay

## 2021-02-07 ENCOUNTER — Other Ambulatory Visit (HOSPITAL_COMMUNITY): Payer: Self-pay

## 2021-02-07 MED ORDER — FUROSEMIDE 20 MG PO TABS
ORAL_TABLET | ORAL | 1 refills | Status: DC
  Filled 2021-02-07: qty 34, 30d supply, fill #0
  Filled 2021-06-13: qty 34, 30d supply, fill #1

## 2021-02-07 MED ORDER — LOSARTAN POTASSIUM-HCTZ 100-25 MG PO TABS
ORAL_TABLET | ORAL | 2 refills | Status: DC
  Filled 2021-02-07: qty 30, 30d supply, fill #0
  Filled 2021-06-13: qty 30, 30d supply, fill #1
  Filled 2021-09-04: qty 30, 30d supply, fill #2

## 2021-02-07 MED ORDER — METOPROLOL SUCCINATE ER 50 MG PO TB24
ORAL_TABLET | ORAL | 5 refills | Status: DC
  Filled 2021-02-07: qty 30, 30d supply, fill #0
  Filled 2021-03-25: qty 30, 30d supply, fill #1
  Filled 2021-05-11: qty 30, 30d supply, fill #2
  Filled 2021-06-13: qty 30, 30d supply, fill #3
  Filled 2021-09-13: qty 30, 30d supply, fill #4
  Filled 2021-10-14: qty 30, 30d supply, fill #5

## 2021-02-28 ENCOUNTER — Other Ambulatory Visit (HOSPITAL_COMMUNITY): Payer: Self-pay

## 2021-03-25 ENCOUNTER — Other Ambulatory Visit (HOSPITAL_COMMUNITY): Payer: Self-pay

## 2021-03-25 DIAGNOSIS — J029 Acute pharyngitis, unspecified: Secondary | ICD-10-CM | POA: Diagnosis not present

## 2021-03-25 DIAGNOSIS — I10 Essential (primary) hypertension: Secondary | ICD-10-CM | POA: Diagnosis not present

## 2021-03-25 DIAGNOSIS — R5383 Other fatigue: Secondary | ICD-10-CM | POA: Diagnosis not present

## 2021-03-25 DIAGNOSIS — B0229 Other postherpetic nervous system involvement: Secondary | ICD-10-CM | POA: Diagnosis not present

## 2021-03-25 DIAGNOSIS — Z299 Encounter for prophylactic measures, unspecified: Secondary | ICD-10-CM | POA: Diagnosis not present

## 2021-04-14 ENCOUNTER — Encounter: Payer: Self-pay | Admitting: Emergency Medicine

## 2021-04-14 ENCOUNTER — Other Ambulatory Visit: Payer: Self-pay

## 2021-04-14 ENCOUNTER — Emergency Department
Admission: EM | Admit: 2021-04-14 | Discharge: 2021-04-14 | Disposition: A | Discharge status: Home / Self Care | Payer: 59 | Source: Home / Self Care

## 2021-04-14 DIAGNOSIS — R1013 Epigastric pain: Secondary | ICD-10-CM

## 2021-04-14 DIAGNOSIS — K219 Gastro-esophageal reflux disease without esophagitis: Secondary | ICD-10-CM | POA: Diagnosis not present

## 2021-04-14 MED ORDER — DEXLANSOPRAZOLE 30 MG PO CPDR: 30.0000 mg | DELAYED_RELEASE_CAPSULE | Freq: Every day | ORAL | 0 refills | Status: DC

## 2021-04-14 NOTE — ED Provider Notes (Signed)
[facility]    CSN: [id number] Arrival date & time: 04/14/21  1033      History   Chief Complaint Chief Complaint  Patient presents with   Abdominal Pain    HPI Karelly Dewalt Harte-Galloway is a 45 y.o. female.   HPI 45 year old female presents with epigastric pain for 1 day.  Reports pressure in epigastric region.  Patient is currently on amantadine 20 mg and Protonix 40 mg.  PMH significant for acid reflux, pericarditis, and chest pain at rest.  Past Medical History:  Diagnosis Date   Abscess of neck 2019   Acid reflux    Anxiety    Essential hypertension    Pericarditis    Possible pericarditis/myocarditis December 2020    Patient Active Problem List   Diagnosis Date Noted   Patient desires pregnancy 12/17/2020   Encounter for screening fecal occult blood testing 12/17/2020   Essential hypertension    Acid reflux    Anxiety    Pericarditis 06/08/2019   Chest pain at rest 06/05/2019   Hypertensive heart disease 12/01/2018   Screening for colorectal cancer 12/01/2018   Encounter for gynecological examination with Papanicolaou smear of cervix 12/01/2018   Abscess of neck 02/08/2018    Past Surgical History:  Procedure Laterality Date   LEFT HEART CATH AND CORONARY ANGIOGRAPHY N/A 06/05/2019   Procedure: LEFT HEART CATH AND CORONARY ANGIOGRAPHY;  Surgeon: Tonny Bollman, MD;  Location: [facility] INVASIVE CV LAB;  Service: Cardiovascular;  Laterality: N/A;   WISDOM TOOTH EXTRACTION      OB History     Gravida  5   Para  2   Term  2   Preterm      AB  3   Living  2      SAB      IAB      Ectopic      Multiple      Live Births  1            Home Medications    Prior to Admission medications   Medication Sig Start Date End Date Taking? Authorizing Provider  amLODipine (NORVASC) 5 MG tablet Take 5 mg by mouth daily. 05/21/19  Yes [provider]  BIOTIN PO Take 1 tablet by mouth daily.   Yes [provider]   Dexlansoprazole 30 MG capsule Take 1 capsule (30 mg total) by mouth daily. 04/14/21 05/14/21 Yes Trevor Iha, FNP  furosemide (LASIX) 20 MG tablet Take 1 tablet by mouth twice daily for 2 days then decrease to 1 tablet daily as needed. 02/07/21  Yes   gabapentin (NEURONTIN) 300 MG capsule Take 300 mg by mouth 2 (two) times daily. 11/30/20  Yes [provider]  gabapentin (NEURONTIN) 300 MG capsule Take 1 capsule (300 mg total) by mouth 2 (two) times daily. 01/16/21  Yes   gabapentin (NEURONTIN) 300 MG capsule Take 1 capsule by mouth every morning and take 2 capsules at bedtime. 01/21/21  Yes   losartan-hydrochlorothiazide (HYZAAR) 100-25 MG tablet Take 1 tablet by mouth daily in the morning. 02/07/21  Yes   metoprolol succinate (TOPROL-XL) 50 MG 24 hr tablet Take 1 (one) Tablet by mouth daily-- dose changed 02/07/21  Yes   Multiple Vitamin (MULTIVITAMIN PO) Take 1 tablet by mouth daily.   Yes [provider]  pantoprazole (PROTONIX) 40 MG tablet Take 1 tablet (40 mg total) by mouth daily. 01/02/21  Yes   valACYclovir (VALTREX) 1000 MG tablet Take 1,000 mg by  mouth 3 (three) times daily. 08/08/20  Yes [provider]  venlafaxine XR (EFFEXOR-XR) 150 MG 24 hr capsule Take 1 capsule (150 mg total) by mouth daily. 01/02/21  Yes   vitamin B-12 (CYANOCOBALAMIN) 100 MCG tablet Take 100 mcg by mouth daily.   Yes [provider]  amLODipine (NORVASC) 5 MG tablet Take 1 tablet (5 mg total) by mouth daily. 11/30/20     famotidine (PEPCID) 20 MG tablet Take 20 mg by mouth daily.    [provider]  famotidine (PEPCID) 20 MG tablet Take 1 tablet (20 mg total) by mouth daily. 10/10/20     famotidine (PEPCID) 20 MG tablet Take 1 tablet (20 mg total) by mouth daily. 11/30/20     losartan-hydrochlorothiazide (HYZAAR) 100-25 MG tablet Take 1 tablet by mouth daily.    [provider]  metoprolol succinate (TOPROL-XL) 50 MG 24 hr tablet Take 1 tablet (50 mg total) by mouth  daily. 07/01/19   Berton Bon, NP  venlafaxine XR (EFFEXOR-XR) 75 MG 24 hr capsule Take 75 mg by mouth daily with breakfast.    [provider]    Family History Family History  Problem Relation Age of Onset   Cancer Father        Osteosarcoma    Kidney disease Mother    Hepatitis C Mother    Hypertension Mother    Thyroid disease Sister     Social History Social History   Tobacco Use   Smoking status: Never   Smokeless tobacco: Never  Vaping Use   Vaping Use: Never used  Substance Use Topics   Alcohol use: No   Drug use: No     Allergies   Doxycycline and Augmentin [amoxicillin-pot clavulanate]   Review of Systems Review of Systems  Gastrointestinal:        Epigastric pain x1 day  All other systems reviewed and are negative.   Physical Exam Triage Vital Signs ED Triage Vitals  Enc Vitals Group     BP 04/14/21 1116 123/78     Pulse Rate 04/14/21 1116 71     Resp 04/14/21 1116 18     Temp 04/14/21 1116 98.3 F (36.8 C)     Temp Source 04/14/21 1116 Oral     SpO2 04/14/21 1116 100 %     Weight --      Height --      Head Circumference --      Peak Flow --      Pain Score 04/14/21 1113 7     Pain Loc --      Pain Edu? --      Excl. in GC? --    No data found.  Updated Vital Signs BP 123/78 (BP Location: Right Arm)   Pulse 71   Temp 98.3 F (36.8 C) (Oral)   Resp 18   SpO2 100%   Physical Exam Vitals and nursing note reviewed.  Constitutional:      General: She is not in acute distress.    Appearance: She is obese. She is not ill-appearing.  HENT:     Head: Normocephalic and atraumatic.     Mouth/Throat:     Mouth: Mucous membranes are moist.     Pharynx: Oropharynx is clear.  Eyes:     Extraocular Movements: Extraocular movements intact.     Pupils: Pupils are equal, round, and reactive to light.  Cardiovascular:     Rate and Rhythm: Normal rate and regular rhythm.  Heart sounds: Normal heart sounds.  Pulmonary:      Effort: Pulmonary effort is normal.     Breath sounds: Normal breath sounds.  Abdominal:     General: Bowel sounds are normal.     Palpations: Abdomen is soft. There is no shifting dullness, fluid wave, hepatomegaly, splenomegaly or pulsatile mass.     Tenderness: There is abdominal tenderness in the epigastric area. There is no right CVA tenderness, left CVA tenderness, guarding or rebound. Negative signs include Murphy's sign, Rovsing's sign, McBurney's sign and obturator sign.  Skin:    General: Skin is warm and dry.  Neurological:     General: No focal deficit present.     Mental Status: She is alert and oriented to person, place, and time.     UC Treatments / Results  Labs (all labs ordered are listed, but only abnormal results are displayed) Labs Reviewed - No data to display  EKG   Radiology No results found.  Procedures Procedures (including critical care time)  Medications Ordered in UC Medications - No data to display  Initial Impression / Assessment and Plan / UC Course  I have reviewed the triage vital signs and the nursing notes.  Pertinent labs & imaging results that were available during my care of the patient were reviewed by me and considered in my medical decision making (see chart for details).    MDM: 1.  Abdominal pain, epigastric-Rx'd Dexilant. Advised/instructed patient to discontinue Protonix and omeprazole and to start Dexilant daily a.m.  Advised/instructed patient to take medication first thing in the morning after voiding with 8 ounces of water on empty stomach and no food or other medications for 45 minutes after taking Dexilant.  Advised patient if symptoms worsen and/or unresolved please follow-up with GI provider.  Patient discharged home, hemodynamically stable Final Clinical Impressions(s) / UC Diagnoses   Final diagnoses:  Abdominal pain, epigastric  Gastroesophageal reflux disease, unspecified whether esophagitis present     Discharge  Instructions      Advised/instructed patient to discontinue Protonix and omeprazole and to start Dexilant daily a.m.  Advised/instructed patient to take medication first thing in the morning after voiding with 8 ounces of water on empty stomach and no food or other medications for 45 minutes after taking Dexilant.  Advised patient if symptoms worsen and/or unresolved please follow-up with GI provider.     ED Prescriptions     Medication Sig Dispense Auth. Provider   Dexlansoprazole 30 MG capsule Take 1 capsule (30 mg total) by mouth daily. 30 capsule Trevor Iha, FNP      PDMP not reviewed this encounter.   Trevor Iha, FNP 04/14/21 1212

## 2021-04-14 NOTE — ED Triage Notes (Signed)
Patient presents to Urgent Care with complaints of epigastric pain since 1 day ago. Patient reports pain and pressure of the epigastric region. Pain in intermittent. Have not tried any medication for pain. Denies any sob or pain radiating. Not able to sleep last night

## 2021-04-14 NOTE — Discharge Instructions (Addendum)
Advised/instructed patient to discontinue Protonix and omeprazole and to start Dexilant daily a.m.  Advised/instructed patient to take medication first thing in the morning after voiding with 8 ounces of water on empty stomach and no food or other medications for 45 minutes after taking Dexilant.  Advised patient if symptoms worsen and/or unresolved please follow-up with GI provider.

## 2021-04-15 ENCOUNTER — Other Ambulatory Visit (HOSPITAL_COMMUNITY): Payer: Self-pay

## 2021-04-15 MED ORDER — DEXLANSOPRAZOLE 30 MG PO CPDR
30.0000 mg | DELAYED_RELEASE_CAPSULE | Freq: Every day | ORAL | 0 refills | Status: DC
  Filled 2021-04-15: qty 30, 30d supply, fill #0

## 2021-04-17 ENCOUNTER — Other Ambulatory Visit (HOSPITAL_COMMUNITY): Payer: Self-pay

## 2021-04-18 ENCOUNTER — Other Ambulatory Visit (HOSPITAL_COMMUNITY): Payer: Self-pay

## 2021-04-19 ENCOUNTER — Other Ambulatory Visit: Payer: Self-pay

## 2021-04-19 ENCOUNTER — Emergency Department (HOSPITAL_COMMUNITY)
Admission: EM | Admit: 2021-04-19 | Discharge: 2021-04-19 | Disposition: A | Discharge status: Home / Self Care | Payer: PRIVATE HEALTH INSURANCE | Attending: Student | Admitting: Student

## 2021-04-19 ENCOUNTER — Emergency Department (HOSPITAL_COMMUNITY): Payer: 59 | Attending: Physician Assistant

## 2021-04-19 ENCOUNTER — Encounter (HOSPITAL_COMMUNITY): Payer: Self-pay | Admitting: Emergency Medicine

## 2021-04-19 DIAGNOSIS — Z79899 Other long term (current) drug therapy: Secondary | ICD-10-CM | POA: Diagnosis not present

## 2021-04-19 DIAGNOSIS — I10 Essential (primary) hypertension: Secondary | ICD-10-CM | POA: Insufficient documentation

## 2021-04-19 DIAGNOSIS — M25562 Pain in left knee: Secondary | ICD-10-CM | POA: Diagnosis not present

## 2021-04-19 DIAGNOSIS — Y9301 Activity, walking, marching and hiking: Secondary | ICD-10-CM | POA: Diagnosis not present

## 2021-04-19 DIAGNOSIS — Y990 Civilian activity done for income or pay: Secondary | ICD-10-CM | POA: Insufficient documentation

## 2021-04-19 DIAGNOSIS — W228XXA Striking against or struck by other objects, initial encounter: Secondary | ICD-10-CM | POA: Insufficient documentation

## 2021-04-19 DIAGNOSIS — M25569 Pain in unspecified knee: Secondary | ICD-10-CM

## 2021-04-19 MED ORDER — KETOROLAC TROMETHAMINE 30 MG/ML IJ SOLN
30.0000 mg | Freq: Once | INTRAMUSCULAR | Status: AC
  Administered 2021-04-19: 30 mg via INTRAMUSCULAR
  Filled 2021-04-19: qty 1

## 2021-04-19 MED ORDER — NAPROXEN 500 MG PO TABS: 500.0000 mg | ORAL_TABLET | Freq: Two times a day (BID) | ORAL | 0 refills | Status: AC

## 2021-04-19 MED ORDER — HYDROCODONE-ACETAMINOPHEN 5-325 MG PO TABS: 1.0000 | ORAL_TABLET | Freq: Four times a day (QID) | ORAL | 0 refills | Status: DC | PRN

## 2021-04-19 NOTE — Discharge Instructions (Addendum)
You may alternate taking Tylenol and Naproxen as needed for pain control. You may take Naproxen twice daily as directed on your discharge paperwork and you may take  [phone] mg of Tylenol every 6 hours. Do not exceed 4000 mg of Tylenol daily as this can lead to liver damage. Also, make sure to take Naproxen with meals as it can cause an upset stomach. Do not take other NSAIDs while taking Naproxen such as (Aleve, Ibuprofen, Aspirin, Celebrex, etc) and do not take more than the prescribed dose as this can lead to ulcers and bleeding in your GI tract. You may use warm and cold compresses to help with your symptoms.   Prescription given for norco. Take medication as directed and do not operate machinery, drive a car, or work while taking this medication as it can make you drowsy.   Please follow up with your orthopedic doctor within the next 7-10 days for re-evaluation and further treatment of your symptoms.   Please return to the ER sooner if you have any new or worsening symptoms.

## 2021-04-19 NOTE — Progress Notes (Signed)
Orthopedic Tech Progress Note Patient Details:  Gwendolyn Edwards [birth year] [id number] 20'' knee immobilizer was applied to the left leg and I also supplied 5'3'' crutches.  Ortho Devices Type of Ortho Device: Knee Immobilizer, Crutches Ortho Device/Splint Location: Left knee Ortho Device/Splint Interventions: Application, Adjustment   Post Interventions Patient Tolerated: Well  Gwendolyn Edwards 04/19/2021, 3:48 PM

## 2021-04-19 NOTE — ED Provider Notes (Addendum)
[facility]-EMERGENCY DEPT Provider Note   CSN: [id number] Arrival date & time: 04/19/21  1351     History Chief Complaint  Patient presents with   Knee Pain     Gwendolyn Edwards is a 45 y.o. female.  HPI   45 y/o female presents for eval of left knee pain. She was walking pta and twisted her knee inward. Had some mild pain at that time but it was not severe. Later in the day she accidentally hit her foot on a table which made her knee hurt significantly worse. Now she is having constant severe pain in the left anterior knee. She is unable to bend the knee.   Past Medical History:  Diagnosis Date   Abscess of neck 2019   Acid reflux    Anxiety    Essential hypertension    Pericarditis    Possible pericarditis/myocarditis December 2020    Patient Active Problem List   Diagnosis Date Noted   Patient desires pregnancy 12/17/2020   Encounter for screening fecal occult blood testing 12/17/2020   Essential hypertension    Acid reflux    Anxiety    Pericarditis 06/08/2019   Chest pain at rest 06/05/2019   Hypertensive heart disease 12/01/2018   Screening for colorectal cancer 12/01/2018   Encounter for gynecological examination with Papanicolaou smear of cervix 12/01/2018   Abscess of neck 02/08/2018    Past Surgical History:  Procedure Laterality Date   LEFT HEART CATH AND CORONARY ANGIOGRAPHY N/A 06/05/2019   Procedure: LEFT HEART CATH AND CORONARY ANGIOGRAPHY;  Surgeon: Tonny Bollman, MD;  Location: [facility] INVASIVE CV LAB;  Service: Cardiovascular;  Laterality: N/A;   WISDOM TOOTH EXTRACTION       OB History     Gravida  5   Para  2   Term  2   Preterm      AB  3   Living  2      SAB      IAB      Ectopic      Multiple      Live Births  1           Family History  Problem Relation Age of Onset   Cancer Father        Osteosarcoma    Kidney disease Mother    Hepatitis C Mother    Hypertension Mother     Thyroid disease Sister     Social History   Tobacco Use   Smoking status: Never   Smokeless tobacco: Never  Vaping Use   Vaping Use: Never used  Substance Use Topics   Alcohol use: No   Drug use: No    Home Medications Prior to Admission medications   Medication Sig Start Date End Date Taking? Authorizing Provider  HYDROcodone-acetaminophen (NORCO/VICODIN) 5-325 MG tablet Take 1 tablet by mouth every 6 (six) hours as needed. 04/19/21  Yes Laniece Hornbaker S, PA-C  naproxen (NAPROSYN) 500 MG tablet Take 1 tablet (500 mg total) by mouth 2 (two) times daily for 7 days. 04/19/21 04/26/21 Yes Warden Buffa S, PA-C  amLODipine (NORVASC) 5 MG tablet Take 5 mg by mouth daily. 05/21/19   [provider]  amLODipine (NORVASC) 5 MG tablet Take 1 tablet (5 mg total) by mouth daily. 11/30/20     BIOTIN PO Take 1 tablet by mouth daily.    [provider]  Dexlansoprazole 30 MG capsule Take 1 capsule (30 mg total) by mouth daily.  04/14/21 05/14/21  Trevor Iha, FNP  Dexlansoprazole 30 MG capsule Take 1 capsule (30 mg total) by mouth daily. 04/14/21   Trevor Iha, FNP  famotidine (PEPCID) 20 MG tablet Take 20 mg by mouth daily.    [provider]  famotidine (PEPCID) 20 MG tablet Take 1 tablet (20 mg total) by mouth daily. 10/10/20     famotidine (PEPCID) 20 MG tablet Take 1 tablet (20 mg total) by mouth daily. 11/30/20     furosemide (LASIX) 20 MG tablet Take 1 tablet by mouth twice daily for 2 days then decrease to 1 tablet daily as needed. 02/07/21     gabapentin (NEURONTIN) 300 MG capsule Take 300 mg by mouth 2 (two) times daily. 11/30/20   [provider]  gabapentin (NEURONTIN) 300 MG capsule Take 1 capsule (300 mg total) by mouth 2 (two) times daily. 01/16/21     gabapentin (NEURONTIN) 300 MG capsule Take 1 capsule by mouth every morning and take 2 capsules at bedtime. 01/21/21     losartan-hydrochlorothiazide (HYZAAR) 100-25 MG tablet Take 1 tablet by mouth  daily.    [provider]  losartan-hydrochlorothiazide (HYZAAR) 100-25 MG tablet Take 1 tablet by mouth daily in the morning. 02/07/21     metoprolol succinate (TOPROL-XL) 50 MG 24 hr tablet Take 1 tablet (50 mg total) by mouth daily. 07/01/19   Berton Bon, NP  metoprolol succinate (TOPROL-XL) 50 MG 24 hr tablet Take 1 (one) Tablet by mouth daily-- dose changed 02/07/21     Multiple Vitamin (MULTIVITAMIN PO) Take 1 tablet by mouth daily.    [provider]  pantoprazole (PROTONIX) 40 MG tablet Take 1 tablet (40 mg total) by mouth daily. 01/02/21     valACYclovir (VALTREX) 1000 MG tablet Take 1,000 mg by mouth 3 (three) times daily. 08/08/20   [provider]  venlafaxine XR (EFFEXOR-XR) 150 MG 24 hr capsule Take 1 capsule (150 mg total) by mouth daily. 01/02/21     venlafaxine XR (EFFEXOR-XR) 75 MG 24 hr capsule Take 75 mg by mouth daily with breakfast.    [provider]  vitamin B-12 (CYANOCOBALAMIN) 100 MCG tablet Take 100 mcg by mouth daily.    [provider]    Allergies    Doxycycline and Augmentin [amoxicillin-pot clavulanate]  Review of Systems   Review of Systems  Constitutional:  Negative for fever.  Musculoskeletal:        Left knee pain   Physical Exam Updated Vital Signs BP (!) 191/90 (BP Location: Right Arm)   Pulse 93   Temp 98.9 F (37.2 C) (Oral)   Resp 18   LMP 04/18/2021 (Exact Date)   SpO2 100%   Physical Exam Vitals and nursing note reviewed.  Constitutional:      General: She is not in acute distress.    Appearance: She is well-developed.  HENT:     Head: Normocephalic.  Eyes:     Conjunctiva/sclera: Conjunctivae normal.  Cardiovascular:     Rate and Rhythm: Normal rate.  Pulmonary:     Effort: Pulmonary effort is normal.  Musculoskeletal:     Cervical back: Neck supple.     Comments: TTP to the left knee along the medial joint line. No erythema, warmth, swelling or effusion  Skin:    General: Skin is  warm and dry.  Neurological:     Mental Status: She is alert.    ED Results / Procedures / Treatments   Labs (all labs ordered are listed,  but only abnormal results are displayed) Labs Reviewed - No data to display  EKG None  Radiology DG Knee Complete 4 Views Left  Result Date: 04/19/2021 CLINICAL DATA:  Twisted knee followed by striking knee against a table EXAM: LEFT KNEE - COMPLETE 4+ VIEW COMPARISON:  None. FINDINGS: There is no acute fracture or dislocation. Knee alignment is normal. There is mild tricompartmental joint space narrowing with associated mild osteophytosis. There is no suprapatellar effusion. The soft tissues are unremarkable. IMPRESSION: No acute fracture or dislocation. Mild tricompartmental degenerative changes. Electronically Signed   By: Lesia Hausen M.D.   On: 04/19/2021 14:48    Procedures Procedures   Medications Ordered in ED Medications  ketorolac (TORADOL) 30 MG/ML injection 30 mg (30 mg Intramuscular Given 04/19/21 1431)    ED Course  I have reviewed the triage vital signs and the nursing notes.  Pertinent labs & imaging results that were available during my care of the patient were reviewed by me and considered in my medical decision making (see chart for details).    MDM Rules/Calculators/A&P                          45 y/o female presenting for eval of left knee pain after turning her knee inward. Xray is negative for fx. No dislocation injury suspected. Concern for meniscal tear. Pt placed in knee immobilizer and given crutches. She is advised to f/u with her orthopedic provider and return if worse. All questions answered, pt stable for discharge.   Final Clinical Impression(s) / ED Diagnoses Final diagnoses:  Acute knee pain, unspecified laterality    Rx / DC Orders ED Discharge Orders          Ordered    naproxen (NAPROSYN) 500 MG tablet  2 times daily        04/19/21 1438    HYDROcodone-acetaminophen (NORCO/VICODIN) 5-325 MG  tablet  Every 6 hours PRN        04/19/21 1532             Karrie Meres, PA-C 04/19/21 1534    Kommor, Wyn Forster, MD 04/19/21 1615    Karrie Meres, PA-C 04/19/21 1627    Glendora Score, MD 04/20/21 1815

## 2021-04-19 NOTE — ED Triage Notes (Signed)
Pt twisted her L knee earlier today, hit her L foot on a bedside table today and had sudden L knee pain, unable to ambulate, reports 1/10 pain.

## 2021-04-19 NOTE — ED Notes (Signed)
Ortho tech consulted to apply knee immobilizer and provider crutches.

## 2021-04-19 NOTE — ED Notes (Addendum)
RN entered room to review pts discharge paperwork. While RN was reviewing discharge papers, pt was ignoring RN watching the television and consuming food. After reviewing pts discharge papers, RN asked the pt if she would like to ambulate using her crutches or if we could assist her into a wheelchair. Pt rudely stated "I obviously need a wheelchair".   Sports administrator entered room to help the pt into a wheelchair to assist her to her car, pt began raising her voice at staff stating that she had ordered [facility] and she had been waiting on "her nurse tech" to bring it to her for 30 minutes. Both RNs kindly explained that there is no nurse tech for the fast tract area and that the staff in triage have been tending to another difficult pt, therefore did not have a chance to obtain her [facility]. Pt again speaking rudely to staff and raising her voice upset about the fact that her Michaelyn Barter was not delivered to her in a timely manner. Pt was assisted into the wheelchair and rolled into the lobby where pt then stated she needed to wait on the 4th floor staff to bring her belongings to her. RN let the pt know we could roll the pt in the wheelchair to the side door where 4th floor staff would be able to locate her when they arrived. Pt rudely yelled "just stop right here" and hoped out of wheelchair and and ambulated to the bench outside the ED.

## 2021-04-23 ENCOUNTER — Encounter: Payer: Self-pay | Admitting: *Deleted

## 2021-04-24 ENCOUNTER — Encounter: Payer: Self-pay | Admitting: Diagnostic Neuroimaging

## 2021-04-24 ENCOUNTER — Ambulatory Visit: Payer: 59 | Admitting: Diagnostic Neuroimaging

## 2021-04-24 ENCOUNTER — Other Ambulatory Visit (HOSPITAL_COMMUNITY): Payer: Self-pay

## 2021-04-24 VITALS — BP 118/76 | HR 64 | Ht 63.0 in | Wt 278.6 lb

## 2021-04-24 DIAGNOSIS — B0229 Other postherpetic nervous system involvement: Secondary | ICD-10-CM

## 2021-04-24 DIAGNOSIS — G43109 Migraine with aura, not intractable, without status migrainosus: Secondary | ICD-10-CM | POA: Diagnosis not present

## 2021-04-24 MED ORDER — RIZATRIPTAN BENZOATE 10 MG PO TBDP
10.0000 mg | ORAL_TABLET | ORAL | 11 refills | Status: AC | PRN
  Filled 2021-04-24: qty 9, 30d supply, fill #0
  Filled 2021-06-13: qty 9, 30d supply, fill #1
  Filled 2021-08-08: qty 9, 30d supply, fill #2
  Filled 2022-01-31: qty 9, 30d supply, fill #3

## 2021-04-24 MED ORDER — TOPIRAMATE 50 MG PO TABS
50.0000 mg | ORAL_TABLET | Freq: Two times a day (BID) | ORAL | 12 refills | Status: DC
  Filled 2021-04-24: qty 60, 30d supply, fill #0
  Filled 2021-06-13: qty 60, 30d supply, fill #1
  Filled 2021-11-09: qty 60, 30d supply, fill #2
  Filled 2021-12-12: qty 60, 30d supply, fill #3
  Filled 2022-01-31: qty 60, 30d supply, fill #4
  Filled 2022-04-17: qty 60, 30d supply, fill #5

## 2021-04-24 NOTE — Patient Instructions (Addendum)
   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

## 2021-04-24 NOTE — Progress Notes (Signed)
[facility]  PATIENT: Gwendolyn Edwards DOB: 10-16-1975  REFERRING CLINICIAN: Ignatius Specking, MD HISTORY FROM: patient  REASON FOR VISIT: new consult    HISTORICAL  CHIEF COMPLAINT:  Chief Complaint  Patient presents with   Post herpetic neuralgia    Rm 8 New Pt "shingles Feb 2022 right side of face- headaches on right side, migrating down my spine to my sciatic nerve"    HISTORY OF PRESENT ILLNESS:   45 year old female here for evaluation of postherpetic neuralgia.  08/03/2018 patient had lesion on her right eyebrow.  This spread to her right nose and right periorbital region.  Initially this was treated as a type of cellulitis or spider bite infection.  Eventually PCP recognizes this V1 shingles and was treated with acyclovir and gabapentin.  Lesions eventually resolved.  However she has had throbbing pain over her right head, associated with floaters and sensitivity to light.  Symptoms typically triggered by sunlight or fluorescent lights.  Gabapentin not helping him much nowadays.    REVIEW OF SYSTEMS: Full 14 system review of systems performed and negative with exception of: As per HPI.  ALLERGIES: Allergies  Allergen Reactions   Doxycycline     Hives and itching    Augmentin [Amoxicillin-Pot Clavulanate] Swelling and Rash    Tongue swells     HOME MEDICATIONS: Outpatient Medications Prior to Visit  Medication Sig Dispense Refill   amLODipine (NORVASC) 5 MG tablet Take 1 tablet (5 mg total) by mouth daily. 90 tablet 1   BIOTIN PO Take 1 tablet by mouth daily.     famotidine (PEPCID) 20 MG tablet Take 20 mg by mouth daily.     furosemide (LASIX) 20 MG tablet Take 1 tablet by mouth twice daily for 2 days then decrease to 1 tablet daily as needed. 34 tablet 1   gabapentin (NEURONTIN) 300 MG capsule Take 300 mg by mouth 2 (two) times daily.     HYDROcodone-acetaminophen (NORCO/VICODIN) 5-325 MG tablet Take 1 tablet by mouth every 6 (six) hours  as needed. 10 tablet 0   losartan-hydrochlorothiazide (HYZAAR) 100-25 MG tablet Take 1 tablet by mouth daily in the morning. 30 tablet 2   metoprolol succinate (TOPROL-XL) 50 MG 24 hr tablet Take 1 (one) Tablet by mouth daily-- dose changed 30 tablet 5   Multiple Vitamin (MULTIVITAMIN PO) Take 1 tablet by mouth daily.     naproxen (NAPROSYN) 500 MG tablet Take 1 tablet (500 mg total) by mouth 2 (two) times daily for 7 days. 14 tablet 0   pantoprazole (PROTONIX) 40 MG tablet Take 1 tablet (40 mg total) by mouth daily. 90 tablet 3   venlafaxine XR (EFFEXOR-XR) 150 MG 24 hr capsule Take 1 capsule (150 mg total) by mouth daily. 30 capsule 11   vitamin B-12 (CYANOCOBALAMIN) 100 MCG tablet Take 100 mcg by mouth daily.     Dexlansoprazole 30 MG capsule Take 1 capsule (30 mg total) by mouth daily. (Patient not taking: Reported on 04/24/2021) 30 capsule 0   amLODipine (NORVASC) 5 MG tablet Take 5 mg by mouth daily.     Dexlansoprazole 30 MG capsule Take 1 capsule (30 mg total) by mouth daily. 30 capsule 0   famotidine (PEPCID) 20 MG tablet Take 1 tablet (20 mg total) by mouth daily. 30 tablet 0   famotidine (PEPCID) 20 MG tablet Take 1 tablet (20 mg total) by mouth daily. 30 tablet 3   gabapentin (NEURONTIN) 300 MG capsule Take 1 capsule (300 mg  total) by mouth 2 (two) times daily. 60 capsule 1   gabapentin (NEURONTIN) 300 MG capsule Take 1 capsule by mouth every morning and take 2 capsules at bedtime. 90 capsule 2   losartan-hydrochlorothiazide (HYZAAR) 100-25 MG tablet Take 1 tablet by mouth daily.     metoprolol succinate (TOPROL-XL) 50 MG 24 hr tablet Take 1 tablet (50 mg total) by mouth daily. 90 tablet 3   valACYclovir (VALTREX) 1000 MG tablet Take 1,000 mg by mouth 3 (three) times daily.     venlafaxine XR (EFFEXOR-XR) 75 MG 24 hr capsule Take 75 mg by mouth daily with breakfast.     No facility-administered medications prior to visit.    PAST MEDICAL HISTORY: Past Medical History:  Diagnosis  Date   Abscess of neck 2019   Acid reflux    Anxiety    Essential hypertension    GERD (gastroesophageal reflux disease)    Hemorrhoids    Hypertension    IBS (irritable bowel syndrome)    OA (osteoarthritis) of knee    Pericarditis    Possible pericarditis/myocarditis December 2020   Shingles 07/2020   recurrent   Snoring     PAST SURGICAL HISTORY: Past Surgical History:  Procedure Laterality Date   LEFT HEART CATH AND CORONARY ANGIOGRAPHY N/A 06/05/2019   Procedure: LEFT HEART CATH AND CORONARY ANGIOGRAPHY;  Surgeon: Sherren Mocha, MD;  Location: [facility];  Service: Cardiovascular;  Laterality: N/A;   WISDOM TOOTH EXTRACTION      FAMILY HISTORY: Family History  Problem Relation Age of Onset   Cancer Father        Osteosarcoma    Kidney disease Mother    Hepatitis C Mother    Hypertension Mother    Thyroid disease Sister     SOCIAL HISTORY: Social History   Socioeconomic History   Marital status: Married    Spouse name: Not on file   Number of children: 2   Years of education: Not on file   Highest education level: Not on file  Occupational History    Comment: [employer]  Tobacco Use   Smoking status: Never   Smokeless tobacco: Never  Vaping Use   Vaping Use: Never used  Substance and Sexual Activity   Alcohol use: No   Drug use: No   Sexual activity: Yes    Birth control/protection: None  Other Topics Concern   Not on file  Social History Narrative   Lives with family   Social Determinants of Health   Financial Resource Strain: Low Risk    Difficulty of Paying Living Expenses: Not hard at all  Food Insecurity: No Food Insecurity   Worried About Charity fundraiser in the Last Year: Never true   [place] in the Last Year: Never true  Transportation Needs: No Transportation Needs   Lack of Transportation (Medical): No   Lack of Transportation (Non-Medical): No  Physical Activity: Insufficiently Active   Days of Exercise  per Week: 1 day   Minutes of Exercise per Session: 60 min  Stress: No Stress Concern Present   Feeling of Stress : Not at all  Social Connections: Socially Integrated   Frequency of Communication with Friends and Family: More than three times a week   Frequency of Social Gatherings with Friends and Family: Twice a week   Attends Religious Services: More than 4 times per year   Active Member of [employer] or Organizations: Yes   Attends Club or  Organization Meetings: More than 4 times per year   Marital Status: Married  Catering manager Violence: Not At Risk   Fear of Current or Ex-Partner: No   Emotionally Abused: No   Physically Abused: No   Sexually Abused: No     PHYSICAL EXAM  GENERAL EXAM/CONSTITUTIONAL: Vitals:  Vitals:   04/24/21 1515  BP: 118/76  Pulse: 64  Weight: 278 lb 9.6 oz (126.4 kg)  Height: 5\' 3"  (1.6 m)   Body mass index is 49.35 kg/m. Wt Readings from Last 3 Encounters:  04/24/21 278 lb 9.6 oz (126.4 kg)  12/17/20 275 lb (124.7 kg)  08/05/20 260 lb (117.9 kg)   Patient is in no distress; well developed, nourished and groomed; neck is supple  CARDIOVASCULAR: Examination of carotid arteries is normal; no carotid bruits Regular rate and rhythm, no murmurs Examination of peripheral vascular system by observation and palpation is normal  EYES: Ophthalmoscopic exam of optic discs and posterior segments is normal; no papilledema or hemorrhages; SLIGHTLY LIGHT SENSITIVE IN RIGHT EYE No results found.  MUSCULOSKELETAL: Gait, strength, tone, movements noted in Neurologic exam below  NEUROLOGIC: MENTAL STATUS:  No flowsheet data found. awake, alert, oriented to person, place and time recent and remote memory intact normal attention and concentration language fluent, comprehension intact, naming intact fund of knowledge appropriate  CRANIAL NERVE:  2nd - no papilledema on fundoscopic exam 2nd, 3rd, 4th, 6th - pupils equal and reactive to light, visual  fields full to confrontation, extraocular muscles intact, no nystagmus 5th - facial sensation symmetric 7th - facial strength symmetric 8th - hearing intact 9th - palate elevates symmetrically, uvula midline 11th - shoulder shrug symmetric 12th - tongue protrusion midline  MOTOR:  normal bulk and tone, full strength in the BUE, BLE  SENSORY:  normal and symmetric to light touch, temperature, vibration  COORDINATION:  finger-nose-finger, fine finger movements normal  REFLEXES:  deep tendon reflexes present and symmetric  GAIT/STATION:  narrow based gait     DIAGNOSTIC DATA (LABS, IMAGING, TESTING) - I reviewed patient records, labs, notes, testing and imaging myself where available.  Lab Results  Component Value Date   WBC 5.8 08/05/2020   HGB 12.2 08/05/2020   HCT 39.0 08/05/2020   MCV 85.5 08/05/2020   PLT 274 08/05/2020      Component Value Date/Time   NA 136 08/05/2020 0954   NA 139 07/01/2019 1007   K 3.7 08/05/2020 0954   CL 105 08/05/2020 0954   CO2 23 08/05/2020 0954   GLUCOSE 109 (H) 08/05/2020 0954   BUN 11 08/05/2020 0954   BUN 10 07/01/2019 1007   CREATININE 0.91 08/05/2020 0954   CALCIUM 8.5 (L) 08/05/2020 0954   PROT 8.6 (H) 08/03/2020 1625   ALBUMIN 4.1 08/03/2020 1625   AST 15 08/03/2020 1625   ALT 13 08/03/2020 1625   ALKPHOS 86 08/03/2020 1625   BILITOT 0.3 08/03/2020 1625   GFRNONAA >60 08/05/2020 0954   GFRAA >60 11/16/2019 0829   Lab Results  Component Value Date   CHOL 182 06/05/2019   HDL 50 06/05/2019   LDLCALC 123 (H) 06/05/2019   TRIG 43 06/05/2019   CHOLHDL 3.6 06/05/2019   No results found for: HGBA1C No results found for: VITAMINB12 No results found for: TSH     ASSESSMENT AND PLAN  45 y.o. year old female here with:   Dx:  1. Post herpetic neuralgia   2. Migraine with aura and without status migrainosus, not intractable  PLAN:  POST-HERPETIC NEURALGIA (right V1 shingles in Feb 2022; now pain  mainly triggered by sunlight, fluorescent lights; throbbing pain; some migraine features) - wear tinted glasses / sunglasses (have been effective) - wean off gabapentin (not effective; but if it turns out it was helping, then could go higher on dosing --> up to 600-900mg  three times a day over time) - start topiramate 50mg  twice a day; rink plenty of water - rizatriptan 10mg  as needed; max 2 per day or 8 per month   Meds ordered this encounter  Medications   topiramate (TOPAMAX) 50 MG tablet    Sig: Take 1 tablet (50 mg total) by mouth 2 (two) times daily.    Dispense:  60 tablet    Refill:  12   rizatriptan (MAXALT-MLT) 10 MG disintegrating tablet    Sig: Take 1 tablet (10 mg total) by mouth as needed for migraine. May repeat in 2 hours if needed    Dispense:  9 tablet    Refill:  11   Return in about 6 months (around 10/22/2021).    Penni Bombard, MD [id number], [id number] PM Certified in Neurology, Neurophysiology and Neuroimaging  [facility], [facility] [address] [place], [place] [address] [phone]

## 2021-04-26 ENCOUNTER — Ambulatory Visit: Payer: 59 | Admitting: Diagnostic Neuroimaging

## 2021-04-27 ENCOUNTER — Other Ambulatory Visit (HOSPITAL_COMMUNITY): Payer: Self-pay

## 2021-04-29 ENCOUNTER — Other Ambulatory Visit (HOSPITAL_COMMUNITY): Payer: Self-pay

## 2021-04-29 MED ORDER — GABAPENTIN 300 MG PO CAPS
ORAL_CAPSULE | ORAL | 2 refills | Status: DC
  Filled 2021-04-29 – 2021-06-13 (×2): qty 90, 30d supply, fill #0
  Filled 2021-07-29: qty 90, 30d supply, fill #1
  Filled 2021-09-13: qty 90, 30d supply, fill #2

## 2021-04-30 ENCOUNTER — Ambulatory Visit: Payer: 59 | Admitting: Orthopedic Surgery

## 2021-04-30 ENCOUNTER — Encounter: Payer: Self-pay | Admitting: Orthopedic Surgery

## 2021-04-30 ENCOUNTER — Other Ambulatory Visit (HOSPITAL_COMMUNITY): Payer: Self-pay

## 2021-04-30 ENCOUNTER — Other Ambulatory Visit: Payer: Self-pay

## 2021-04-30 VITALS — BP 116/85 | HR 81 | Ht 63.0 in | Wt 272.0 lb

## 2021-04-30 DIAGNOSIS — S83412D Sprain of medial collateral ligament of left knee, subsequent encounter: Secondary | ICD-10-CM

## 2021-04-30 MED ORDER — DICLOFENAC SODIUM 1 % EX GEL
2.0000 g | Freq: Four times a day (QID) | CUTANEOUS | 1 refills | Status: DC
  Filled 2021-04-30: qty 200, 16d supply, fill #0
  Filled 2021-05-11: qty 200, 25d supply, fill #0

## 2021-04-30 MED ORDER — DICLOFENAC SODIUM 50 MG PO TBEC
50.0000 mg | DELAYED_RELEASE_TABLET | Freq: Two times a day (BID) | ORAL | 0 refills | Status: DC
  Filled 2021-04-30 – 2021-05-11 (×2): qty 60, 30d supply, fill #0

## 2021-04-30 NOTE — Progress Notes (Signed)
Orthopaedic Clinic Return  Assessment: Gwendolyn Edwards is a 45 y.o. female with the following: Left MCL sprain   Plan: Patient returns to clinic with left knee pain, after a recent twisting injury sustained at work.  She has previously been treated for an MCL injury.  At this point in time, it is not clear what is causing her pain.  She does have diffuse tenderness, including at the insertion of the MCL.  She has responded well to diclofenac in the past, so I will prescribe this for her.  Also recommended the use of Voltaren gel.  Due to the diffuse nature of her discomfort, I recommended she remain out of work until she is reevaluated.  Anticipate that her pain will be improved at the next visit.     Body mass index is 48.18 kg/m.  Follow-up: Return in about 2 weeks (around 05/14/2021).   Subjective:  Chief Complaint  Patient presents with   Knee Injury    Lt knee still hurting and swelling after injury DOI 04/19/21    History of Present Illness: Gwendolyn Edwards is a 45 y.o. female who returns to clinic today for repeat evaluation of her left knee.  I have not seen her for several months.  She reports that she twisted her left knee while at work approximately 10 days ago.  She reports slipping and twisting her knee, and then shortly thereafter she had her left foot on the edge of a bed, which once again jarred her knee.  Since then, she has had pain throughout the left knee.  She been taking naproxen as needed.  She has been wearing a brace.  She is having difficulty with her usual activities at work.   Review of Systems: No fevers or chills No numbness or tingling No chest pain No shortness of breath No bowel or bladder dysfunction No GI distress No headaches   Objective: BP 116/85   Pulse 81   Ht 5\' 3"  (1.6 m)   Wt 272 lb (123.4 kg)   LMP 04/18/2021 (Exact Date)   BMI 48.18 kg/m   Physical Exam:  Left knee with a mild effusion.  No bruising is  appreciated.  No redness.  She is tenderness at the insertion of the MCL.  Tenderness along the medial and lateral joint lines.  No increased laxity to varus or valgus stress at full extension, as well as 30 degrees of flexion.  IMAGING: I personally ordered and reviewed the following images:   No new imaging obtained today. 04/20/2021, MD 04/30/2021 10:48 PM

## 2021-04-30 NOTE — Patient Instructions (Signed)
Focus on full extension of the left leg  Out of work until 05/06/21  Medication sent to pharmacy  Ice the knee  Follow up in 2-3 weeks

## 2021-05-02 ENCOUNTER — Telehealth: Payer: Self-pay | Admitting: Orthopedic Surgery

## 2021-05-02 NOTE — Telephone Encounter (Signed)
Call to patient regarding form received in fax  - discussed [employer] forms process; aware.

## 2021-05-07 ENCOUNTER — Other Ambulatory Visit (HOSPITAL_COMMUNITY): Payer: Self-pay

## 2021-05-08 ENCOUNTER — Other Ambulatory Visit (HOSPITAL_COMMUNITY): Payer: Self-pay

## 2021-05-11 ENCOUNTER — Other Ambulatory Visit (HOSPITAL_COMMUNITY): Payer: Self-pay

## 2021-05-14 ENCOUNTER — Other Ambulatory Visit: Payer: Self-pay

## 2021-05-14 ENCOUNTER — Encounter: Payer: Self-pay | Admitting: Orthopedic Surgery

## 2021-05-14 ENCOUNTER — Ambulatory Visit: Payer: 59 | Admitting: Orthopedic Surgery

## 2021-05-14 VITALS — BP 121/82 | HR 84 | Ht 63.0 in | Wt 272.0 lb

## 2021-05-14 DIAGNOSIS — S83412A Sprain of medial collateral ligament of left knee, initial encounter: Secondary | ICD-10-CM | POA: Diagnosis not present

## 2021-05-14 DIAGNOSIS — Z6841 Body Mass Index (BMI) 40.0 and over, adult: Secondary | ICD-10-CM | POA: Diagnosis not present

## 2021-05-14 DIAGNOSIS — M7052 Other bursitis of knee, left knee: Secondary | ICD-10-CM

## 2021-05-14 NOTE — Patient Instructions (Signed)

## 2021-05-14 NOTE — Progress Notes (Signed)
Orthopaedic Clinic Return  Assessment: Gwendolyn Edwards is a 45 y.o. female with the following: Left MCL sprain Pes anserine bursitis  Plan: Patient continues to have pain.  Pain is primarily within the area of the Pez anserine.  This is consistent to her previous injury.  Previously, this took several months to improve.  She is taking the appropriate medications, including Voltaren gel.  She is using a brace.  She has difficulty by the end of her 12-hour shift.  At this point, her knee is structurally sound.  I am nothing further to offer from a surgical perspective.  Previous injection was not helpful to her pain in this area.  We could consider a referral for an ultrasound-guided injection.  But she is not interested at this time.  We also discussed the possibility proceeding with formal physical therapy, once again, she not interested at this time.  I have asked her to return to clinic if she continues to struggle, without any improvement.   Body mass index is 48.18 kg/m.  Follow-up: Return if symptoms worsen or fail to improve.   Subjective:  Chief Complaint  Patient presents with   Knee Pain    Lt knee pain/soreness    History of Present Illness: Gwendolyn Edwards is a 45 y.o. female who returns to clinic today for repeat evaluation of her left knee.  She continues to have pain in the anterior and medial aspect of the left knee.  She is wearing a brace.  She is taking diclofenac, and using Voltaren gel.  This is improving some of her symptoms.  However, at the end of her 12-hour shifts, she states that the pain is worse.  She has difficulty getting into bed because of the pain.  She does a lot of walking at work, but no exercises, specifically for her knee at this point.  She has not worked with physical therapy.  Review of Systems: No fevers or chills No numbness or tingling No chest pain No shortness of breath No bowel or bladder dysfunction No GI distress No  headaches   Objective: BP 121/82   Pulse 84   Ht 5\' 3"  (1.6 m)   Wt 272 lb (123.4 kg)   LMP 04/18/2021 (Exact Date)   BMI 48.18 kg/m   Physical Exam:  Left knee with a mild effusion.  No bruising is appreciated.  No redness.  She is tenderness at the insertion of the MCL, as well as overlying the Pes anserine tendons.  no increased laxity to varus or valgus stress at full extension, as well as 30 degrees of flexion.  IMAGING: I personally ordered and reviewed the following images:   No new imaging obtained today. 04/20/2021, MD 05/14/2021 8:47 AM

## 2021-05-22 ENCOUNTER — Other Ambulatory Visit: Payer: Self-pay

## 2021-05-22 ENCOUNTER — Ambulatory Visit: Payer: 59 | Admitting: Orthopedic Surgery

## 2021-05-22 ENCOUNTER — Encounter: Payer: Self-pay | Admitting: Orthopedic Surgery

## 2021-05-22 DIAGNOSIS — M25362 Other instability, left knee: Secondary | ICD-10-CM

## 2021-05-22 NOTE — Patient Instructions (Signed)
Central Scheduling [phone]  

## 2021-05-22 NOTE — Progress Notes (Signed)
Orthopaedic Clinic Return  Assessment: DECHELLE ATTAWAY is a 45 y.o. female with the following: Left MCL sprain Pes anserine bursitis Concern for medial meniscus tear; complaining of catching and locking sensations when walking.  Plan: Over the past week, the patient's had worsening pain and symptoms.  She is now having a catching sensation, in the anterior and medial aspect of the knee.  She has diffuse tenderness to palpation on physical exam.  She also notes difficulty fully extending her left knee when she notes the catching sensation.  The catching happens when she is walking.  As a result of these new symptoms, I am concerned about a potential meniscus tear.  As a result, I recommended an MRI, and she is interested in proceeding.  Once the MRIs been completed, we will have her back in clinic to discuss the results.  She should continue with her current work restrictions.    Follow-up: Return for After MRI.   Subjective:  Chief Complaint  Patient presents with   Knee Pain    LT Increased catching and pain    History of Present Illness: Karyssa Amaral Harte-Galloway is a 45 y.o. female who returns to clinic today for repeat evaluation of her left knee.  Over the past week, she notes a catching sensation in the medial aspect of the left knee.  She has persistent swelling.  The catching happens when she is walking.  She also notes difficulty achieving full extension when this catching sensation occurs.  She has been wearing a brace, but this is not improving her pain.  She has continued to take medications as needed.   Review of Systems: No fevers or chills No numbness or tingling No chest pain No shortness of breath No bowel or bladder dysfunction No GI distress No headaches   Objective: There were no vitals taken for this visit.  Physical Exam:  Left knee with a mild effusion.  No bruising is appreciated.  No redness.  She is tenderness at the insertion of the MCL,  as well as overlying the Pes anserine tendons.  no increased laxity to varus or valgus stress at full extension, as well as 30 degrees of flexion.  She has diffuse tenderness over the medial joint line.  Pain with hyperflexion.  Extension to 5 degrees.  Unable to assess via McMurray's test in clinic today.  IMAGING: I personally ordered and reviewed the following images:   No new imaging obtained today.  Oliver Barre, MD 05/22/2021 10:45 PM

## 2021-05-30 ENCOUNTER — Ambulatory Visit (HOSPITAL_COMMUNITY): Admission: RE | Admit: 2021-05-30 | Payer: 59 | Source: Ambulatory Visit

## 2021-06-04 ENCOUNTER — Ambulatory Visit: Payer: 59 | Admitting: Orthopedic Surgery

## 2021-06-12 ENCOUNTER — Other Ambulatory Visit (HOSPITAL_COMMUNITY): Payer: Self-pay

## 2021-06-13 ENCOUNTER — Other Ambulatory Visit (HOSPITAL_COMMUNITY): Payer: Self-pay

## 2021-07-12 ENCOUNTER — Other Ambulatory Visit (HOSPITAL_COMMUNITY): Payer: Self-pay

## 2021-07-12 DIAGNOSIS — I10 Essential (primary) hypertension: Secondary | ICD-10-CM | POA: Diagnosis not present

## 2021-07-12 DIAGNOSIS — F419 Anxiety disorder, unspecified: Secondary | ICD-10-CM | POA: Diagnosis not present

## 2021-07-12 DIAGNOSIS — Z789 Other specified health status: Secondary | ICD-10-CM | POA: Diagnosis not present

## 2021-07-12 DIAGNOSIS — F339 Major depressive disorder, recurrent, unspecified: Secondary | ICD-10-CM | POA: Diagnosis not present

## 2021-07-12 DIAGNOSIS — Z6841 Body Mass Index (BMI) 40.0 and over, adult: Secondary | ICD-10-CM | POA: Diagnosis not present

## 2021-07-12 DIAGNOSIS — Z299 Encounter for prophylactic measures, unspecified: Secondary | ICD-10-CM | POA: Diagnosis not present

## 2021-07-12 MED ORDER — BUSPIRONE HCL 10 MG PO TABS
ORAL_TABLET | ORAL | 0 refills | Status: DC
  Filled 2021-07-12: qty 60, 30d supply, fill #0

## 2021-07-12 MED ORDER — VENLAFAXINE HCL ER 75 MG PO CP24
ORAL_CAPSULE | ORAL | 12 refills | Status: AC
  Filled 2021-07-12: qty 30, 30d supply, fill #0
  Filled 2021-08-05: qty 30, 30d supply, fill #1
  Filled 2021-09-13: qty 30, 30d supply, fill #2
  Filled 2021-11-09: qty 30, 30d supply, fill #3
  Filled 2021-12-12: qty 30, 30d supply, fill #4
  Filled 2022-01-31: qty 30, 30d supply, fill #5
  Filled 2022-03-06: qty 30, 30d supply, fill #6
  Filled 2022-04-17: qty 30, 30d supply, fill #7

## 2021-07-29 ENCOUNTER — Other Ambulatory Visit (HOSPITAL_COMMUNITY): Payer: Self-pay

## 2021-08-01 ENCOUNTER — Other Ambulatory Visit (HOSPITAL_COMMUNITY): Payer: Self-pay

## 2021-08-05 ENCOUNTER — Other Ambulatory Visit (HOSPITAL_COMMUNITY): Payer: Self-pay

## 2021-08-08 ENCOUNTER — Other Ambulatory Visit (HOSPITAL_COMMUNITY): Payer: Self-pay

## 2021-08-09 ENCOUNTER — Other Ambulatory Visit (HOSPITAL_COMMUNITY): Payer: Self-pay

## 2021-08-13 ENCOUNTER — Other Ambulatory Visit (HOSPITAL_COMMUNITY): Payer: Self-pay

## 2021-08-14 ENCOUNTER — Other Ambulatory Visit (HOSPITAL_COMMUNITY): Payer: Self-pay

## 2021-08-14 DIAGNOSIS — Z6841 Body Mass Index (BMI) 40.0 and over, adult: Secondary | ICD-10-CM | POA: Diagnosis not present

## 2021-08-14 DIAGNOSIS — F339 Major depressive disorder, recurrent, unspecified: Secondary | ICD-10-CM | POA: Diagnosis not present

## 2021-08-14 DIAGNOSIS — Z299 Encounter for prophylactic measures, unspecified: Secondary | ICD-10-CM | POA: Diagnosis not present

## 2021-08-14 DIAGNOSIS — I10 Essential (primary) hypertension: Secondary | ICD-10-CM | POA: Diagnosis not present

## 2021-08-14 DIAGNOSIS — F419 Anxiety disorder, unspecified: Secondary | ICD-10-CM | POA: Diagnosis not present

## 2021-08-14 DIAGNOSIS — Z789 Other specified health status: Secondary | ICD-10-CM | POA: Diagnosis not present

## 2021-08-14 MED ORDER — BUSPIRONE HCL 10 MG PO TABS
ORAL_TABLET | ORAL | 5 refills | Status: DC
  Filled 2021-08-14: qty 60, 30d supply, fill #0
  Filled 2021-09-13: qty 60, 30d supply, fill #1
  Filled 2021-10-15: qty 60, 30d supply, fill #2
  Filled 2021-12-12: qty 60, 30d supply, fill #3
  Filled 2022-01-21: qty 60, 30d supply, fill #4
  Filled 2022-03-06: qty 60, 30d supply, fill #5

## 2021-08-15 ENCOUNTER — Other Ambulatory Visit (HOSPITAL_COMMUNITY): Payer: Self-pay

## 2021-08-15 MED ORDER — BUSPIRONE HCL 10 MG PO TABS
ORAL_TABLET | ORAL | 5 refills | Status: DC
  Filled 2021-08-15 – 2022-04-17 (×3): qty 60, 30d supply, fill #0
  Filled 2022-05-30: qty 60, 30d supply, fill #1

## 2021-09-04 ENCOUNTER — Other Ambulatory Visit (HOSPITAL_COMMUNITY): Payer: Self-pay

## 2021-09-04 MED ORDER — FUROSEMIDE 20 MG PO TABS
ORAL_TABLET | ORAL | 1 refills | Status: DC
  Filled 2021-09-04: qty 34, 30d supply, fill #0
  Filled 2021-10-15: qty 34, 30d supply, fill #1

## 2021-09-13 ENCOUNTER — Other Ambulatory Visit (HOSPITAL_COMMUNITY): Payer: Self-pay

## 2021-09-17 ENCOUNTER — Other Ambulatory Visit (HOSPITAL_COMMUNITY): Payer: Self-pay

## 2021-10-14 ENCOUNTER — Other Ambulatory Visit (HOSPITAL_COMMUNITY): Payer: Self-pay

## 2021-10-16 ENCOUNTER — Other Ambulatory Visit (HOSPITAL_COMMUNITY): Payer: Self-pay

## 2021-10-17 ENCOUNTER — Other Ambulatory Visit (HOSPITAL_COMMUNITY): Payer: Self-pay

## 2021-10-17 MED ORDER — LOSARTAN POTASSIUM-HCTZ 100-25 MG PO TABS
ORAL_TABLET | ORAL | 2 refills | Status: DC
  Filled 2021-10-17: qty 30, 30d supply, fill #0
  Filled 2021-12-12: qty 30, 30d supply, fill #1
  Filled 2022-01-31 (×2): qty 30, 30d supply, fill #2

## 2021-10-21 ENCOUNTER — Other Ambulatory Visit (HOSPITAL_COMMUNITY): Payer: Self-pay

## 2021-11-09 ENCOUNTER — Other Ambulatory Visit (HOSPITAL_COMMUNITY): Payer: Self-pay

## 2021-11-11 ENCOUNTER — Other Ambulatory Visit (HOSPITAL_COMMUNITY): Payer: Self-pay

## 2021-11-11 MED ORDER — GABAPENTIN 300 MG PO CAPS
ORAL_CAPSULE | ORAL | 2 refills | Status: DC
  Filled 2021-11-11 – 2021-12-12 (×2): qty 90, 30d supply, fill #0
  Filled 2022-01-31: qty 90, 30d supply, fill #1
  Filled 2022-03-06: qty 90, 30d supply, fill #2

## 2021-11-21 ENCOUNTER — Other Ambulatory Visit (HOSPITAL_COMMUNITY): Payer: Self-pay

## 2021-12-10 ENCOUNTER — Other Ambulatory Visit (HOSPITAL_COMMUNITY): Payer: Self-pay

## 2021-12-12 ENCOUNTER — Other Ambulatory Visit (HOSPITAL_COMMUNITY): Payer: Self-pay

## 2021-12-12 MED ORDER — FUROSEMIDE 20 MG PO TABS
ORAL_TABLET | ORAL | 1 refills | Status: DC
  Filled 2021-12-12: qty 34, 30d supply, fill #0
  Filled 2022-01-31: qty 34, 30d supply, fill #1

## 2021-12-12 MED ORDER — METOPROLOL SUCCINATE ER 50 MG PO TB24
ORAL_TABLET | ORAL | 5 refills | Status: DC
  Filled 2021-12-12: qty 30, 30d supply, fill #0
  Filled 2022-01-31: qty 30, 30d supply, fill #1
  Filled 2022-03-06: qty 30, 30d supply, fill #2
  Filled 2022-04-17: qty 30, 30d supply, fill #3

## 2022-01-21 ENCOUNTER — Other Ambulatory Visit (HOSPITAL_COMMUNITY): Payer: Self-pay

## 2022-01-31 ENCOUNTER — Other Ambulatory Visit (HOSPITAL_COMMUNITY): Payer: Self-pay

## 2022-01-31 MED ORDER — VENLAFAXINE HCL ER 150 MG PO CP24
ORAL_CAPSULE | Freq: Every day | ORAL | 6 refills | Status: DC
  Filled 2022-01-31: qty 30, 30d supply, fill #0
  Filled 2022-04-17: qty 30, 30d supply, fill #1

## 2022-01-31 MED ORDER — AMLODIPINE BESYLATE 5 MG PO TABS
5.0000 mg | ORAL_TABLET | Freq: Every day | ORAL | 2 refills | Status: DC
  Filled 2022-01-31: qty 90, 90d supply, fill #0
  Filled 2022-04-17: qty 90, 90d supply, fill #1

## 2022-02-01 ENCOUNTER — Other Ambulatory Visit (HOSPITAL_COMMUNITY): Payer: Self-pay

## 2022-02-04 ENCOUNTER — Other Ambulatory Visit (HOSPITAL_COMMUNITY): Payer: Self-pay

## 2022-02-04 MED ORDER — AMLODIPINE BESYLATE 5 MG PO TABS
5.0000 mg | ORAL_TABLET | Freq: Every day | ORAL | 2 refills | Status: DC
  Filled 2022-02-04: qty 90, 90d supply, fill #0

## 2022-02-04 MED ORDER — PANTOPRAZOLE SODIUM 40 MG PO TBEC
40.0000 mg | DELAYED_RELEASE_TABLET | Freq: Every day | ORAL | 0 refills | Status: DC
  Filled 2022-02-04 – 2022-03-06 (×2): qty 30, 30d supply, fill #0

## 2022-02-04 MED ORDER — VENLAFAXINE HCL ER 150 MG PO CP24
ORAL_CAPSULE | Freq: Every day | ORAL | 0 refills | Status: DC
  Filled 2022-02-04 – 2022-03-06 (×2): qty 30, 30d supply, fill #0

## 2022-02-13 ENCOUNTER — Other Ambulatory Visit (HOSPITAL_COMMUNITY): Payer: Self-pay

## 2022-03-06 ENCOUNTER — Other Ambulatory Visit (HOSPITAL_COMMUNITY): Payer: Self-pay

## 2022-03-06 MED ORDER — FUROSEMIDE 20 MG PO TABS
20.0000 mg | ORAL_TABLET | Freq: Two times a day (BID) | ORAL | 1 refills | Status: DC
  Filled 2022-03-06: qty 34, 32d supply, fill #0

## 2022-03-25 ENCOUNTER — Ambulatory Visit: Payer: 59 | Admitting: Adult Health

## 2022-03-25 DIAGNOSIS — Z0289 Encounter for other administrative examinations: Secondary | ICD-10-CM

## 2022-04-17 ENCOUNTER — Other Ambulatory Visit (HOSPITAL_COMMUNITY): Payer: Self-pay

## 2022-04-17 MED ORDER — GABAPENTIN 300 MG PO CAPS
ORAL_CAPSULE | ORAL | 0 refills | Status: AC
  Filled 2022-04-17: qty 90, 30d supply, fill #0

## 2022-04-17 MED ORDER — LOSARTAN POTASSIUM-HCTZ 100-25 MG PO TABS
1.0000 | ORAL_TABLET | ORAL | 2 refills | Status: DC
  Filled 2022-04-17: qty 30, 30d supply, fill #0
  Filled 2022-05-30: qty 30, 30d supply, fill #1

## 2022-04-18 ENCOUNTER — Other Ambulatory Visit (HOSPITAL_COMMUNITY): Payer: Self-pay

## 2022-05-01 ENCOUNTER — Other Ambulatory Visit (HOSPITAL_COMMUNITY): Payer: Self-pay

## 2022-05-01 MED ORDER — PANTOPRAZOLE SODIUM 40 MG PO TBEC
40.0000 mg | DELAYED_RELEASE_TABLET | Freq: Every day | ORAL | 3 refills | Status: DC
  Filled 2022-05-01: qty 30, 30d supply, fill #0
  Filled 2022-05-30: qty 30, 30d supply, fill #1

## 2022-05-30 ENCOUNTER — Other Ambulatory Visit (HOSPITAL_COMMUNITY): Payer: Self-pay

## 2022-06-10 ENCOUNTER — Other Ambulatory Visit (HOSPITAL_COMMUNITY): Payer: Self-pay

## 2022-06-11 ENCOUNTER — Other Ambulatory Visit (HOSPITAL_COMMUNITY): Payer: Self-pay

## 2022-06-26 ENCOUNTER — Other Ambulatory Visit (HOSPITAL_COMMUNITY): Payer: Self-pay

## 2022-06-27 ENCOUNTER — Other Ambulatory Visit (HOSPITAL_COMMUNITY): Payer: Self-pay

## 2022-07-04 ENCOUNTER — Encounter: Payer: Self-pay | Admitting: Cardiology

## 2022-07-04 ENCOUNTER — Ambulatory Visit: Payer: No Typology Code available for payment source | Attending: Cardiology | Admitting: Cardiology

## 2022-07-04 ENCOUNTER — Ambulatory Visit: Payer: No Typology Code available for payment source | Attending: Cardiology

## 2022-07-04 ENCOUNTER — Other Ambulatory Visit: Payer: Self-pay | Admitting: Cardiology

## 2022-07-04 VITALS — BP 122/84 | HR 73 | Ht 62.0 in | Wt 264.0 lb

## 2022-07-04 DIAGNOSIS — R002 Palpitations: Secondary | ICD-10-CM | POA: Diagnosis not present

## 2022-07-04 DIAGNOSIS — I493 Ventricular premature depolarization: Secondary | ICD-10-CM

## 2022-07-04 DIAGNOSIS — R0602 Shortness of breath: Secondary | ICD-10-CM | POA: Diagnosis not present

## 2022-07-04 DIAGNOSIS — I2510 Atherosclerotic heart disease of native coronary artery without angina pectoris: Secondary | ICD-10-CM

## 2022-07-04 DIAGNOSIS — I10 Essential (primary) hypertension: Secondary | ICD-10-CM | POA: Diagnosis not present

## 2022-07-04 DIAGNOSIS — I4729 Other ventricular tachycardia: Secondary | ICD-10-CM | POA: Diagnosis not present

## 2022-07-04 DIAGNOSIS — Z8679 Personal history of other diseases of the circulatory system: Secondary | ICD-10-CM

## 2022-07-04 MED ORDER — METOPROLOL SUCCINATE ER 50 MG PO TB24: ORAL_TABLET | ORAL | 3 refills | Status: DC

## 2022-07-04 NOTE — Progress Notes (Signed)
Cardiology Clinic Note   Patient Name: Gwendolyn Edwards Date of Encounter: 07/04/2022  Primary Care Provider:  Ignatius Specking, MD Primary Cardiologist:  None  Patient Profile    47 year old female with past medical history of pericarditis, essential hypertension, nonsustained ventricular tachycardia, palpitations, anxiety, gastroesophageal reflux disease, and nonobstructive coronary artery disease, who is here today to reestablish care.  Past Medical History    Past Medical History:  Diagnosis Date   Abscess of neck 2019   Acid reflux    Anxiety    Essential hypertension    GERD (gastroesophageal reflux disease)    Hemorrhoids    Hypertension    IBS (irritable bowel syndrome)    OA (osteoarthritis) of knee    Pericarditis    Possible pericarditis/myocarditis December 2020   Shingles 07/2020   recurrent   Snoring    Past Surgical History:  Procedure Laterality Date   LEFT HEART CATH AND CORONARY ANGIOGRAPHY N/A 06/05/2019   Procedure: LEFT HEART CATH AND CORONARY ANGIOGRAPHY;  Surgeon: Tonny Bollman, MD;  Location: [facility] INVASIVE CV LAB;  Service: Cardiovascular;  Laterality: N/A;   WISDOM TOOTH EXTRACTION      Allergies  Allergies  Allergen Reactions   Doxycycline     Hives and itching    Augmentin [Amoxicillin-Pot Clavulanate] Swelling and Rash    Tongue swells     History of Present Illness    Gwendolyn Edwards is a 47 year old female with past medical history previously mention of pericarditis/myocarditis in December 2020, essential hypertension, nonsustained ventricular tachycardia, palpitations, anxiety, gastroesophageal reflux disease, nonobstructive coronary artery disease who underwent left heart catheterization 06/05/2019 where she was found to have minimal nonobstructive coronary artery disease with no evidence of a culprit lesion for STEMI.  06/05/2019 patient came in to the emergency department with chest pain and was taken for  emergent cardiac catheterization.  She had patent coronary arteries with minimal nonobstructive coronary artery disease, no evidence of a culprit lesion for STEMI.  Hyperdynamic/normal LV systolic function with LVEF greater than 65% and normal LVEDP, suspect noncardiac chest pain.  High-sensitivity troponins were cycled echocardiogram was ordered.  Echocardiogram revealed LVEF of 50-55%, regional wall motion abnormality, LVEF is normal with an inferior septal hypokinesis, global right ventricle is mildly reduced systolic function, trivial tricuspid regurgitation.  Repeat echocardiogram in 04/04/2020 revealed LVEF of 60-65%, no regional wall motion abnormalities, mild left ventricular hypertrophy, and no valvular abnormalities.  She was last seen in clinic 09/28/2019 by Dr. Diona Browner.  At that time she had been continued on colchicine for 3 months and they discussed stopping current therapy with a follow-up echocardiogram within 6 months.  She was continued on her current medication regimen without changes made.  Unfortunately since that time she has been lost to follow-up.  She returns clinic today with complaints of dyspnea on exertion, peripheral edema, and increased amount of palpitations.  She does note that she needs a refill on her metoprolol succinate 50 mg daily as well.  She denies any chest pain, nausea/vomiting, diaphoresis, or dizziness/lightheadedness.  She also denies any hospitalizations or visits to the emergency department.  Home Medications    Current Outpatient Medications  Medication Sig Dispense Refill   amLODipine (NORVASC) 5 MG tablet Take 1 tablet by mouth daily 90 tablet 2   BIOTIN PO Take 1 tablet by mouth daily.     busPIRone (BUSPAR) 10 MG tablet Take 1 tablet by mouth two times a day. 60 tablet 5  busPIRone (BUSPAR) 10 MG tablet Take 1 tablet by mouth 2 times a day 60 tablet 5   diclofenac Sodium (VOLTAREN) 1 % GEL Apply 2 g topically 4 (four) times daily. 150 g 1    famotidine (PEPCID) 20 MG tablet Take 20 mg by mouth daily.     furosemide (LASIX) 20 MG tablet Take 1 tablet (20 mg total) by mouth 2 (two) times daily for 2 days then take 1 tablet daily as needed 34 tablet 1   gabapentin (NEURONTIN) 300 MG capsule 1 capsule (300 mg total) every morning AND 2 capsules (600 mg total) at bedtime. 90 capsule 0   losartan-hydrochlorothiazide (HYZAAR) 100-25 MG tablet Take 1 tablet by mouth every morning. 30 tablet 2   metoprolol succinate (TOPROL-XL) 50 MG 24 hr tablet Take 1 tablet by mouth daily.-- dose changed 30 tablet 5   Multiple Vitamin (MULTIVITAMIN PO) Take 1 tablet by mouth daily.     pantoprazole (PROTONIX) 40 MG tablet Take 1 tablet (40 mg total) by mouth daily. 30 tablet 3   rizatriptan (MAXALT-MLT) 10 MG disintegrating tablet Dissolve 1 tablet (10 mg total) by mouth as needed for migraine. May repeat in 2 hours if needed 9 tablet 11   venlafaxine XR (EFFEXOR-XR) 150 MG 24 hr capsule Take 1 capsule by mouth daily. 30 capsule 6   venlafaxine XR (EFFEXOR-XR) 150 MG 24 hr capsule Take 1 capsule by mouth daily. 30 capsule 0   venlafaxine XR (EFFEXOR-XR) 75 MG 24 hr capsule Take 1 capsule by mouth daily (in addition to 150 mg dose). 30 capsule 12   vitamin B-12 (CYANOCOBALAMIN) 100 MCG tablet Take 100 mcg by mouth daily.     diclofenac (VOLTAREN) 50 MG EC tablet Take 1 tablet (50 mg total) by mouth 2 (two) times daily. (Patient not taking: Reported on 07/04/2022) 60 tablet 0   HYDROcodone-acetaminophen (NORCO/VICODIN) 5-325 MG tablet Take 1 tablet by mouth every 6 (six) hours as needed. (Patient not taking: Reported on 07/04/2022) 10 tablet 0   topiramate (TOPAMAX) 50 MG tablet Take 1 tablet (50 mg total) by mouth 2 (two) times daily. (Patient not taking: Reported on 07/04/2022) 60 tablet 12   No current facility-administered medications for this visit.     Family History    Family History  Problem Relation Age of Onset   Cancer Father        Osteosarcoma     Kidney disease Mother    Hepatitis C Mother    Hypertension Mother    Thyroid disease Sister    She indicated that her mother is deceased. She indicated that her father is deceased. She indicated that all of her three sisters are alive. She indicated that her brother is alive. She indicated that her maternal grandmother is deceased. She indicated that her maternal grandfather is deceased. She indicated that her paternal grandmother is deceased. She indicated that her paternal grandfather is deceased. She indicated that both of her daughters are alive.  Social History    Social History   Socioeconomic History   Marital status: Married    Spouse name: Not on file   Number of children: 2   Years of education: Not on file   Highest education level: Not on file  Occupational History    Comment: [employer]  Tobacco Use   Smoking status: Never   Smokeless tobacco: Never  Vaping Use   Vaping Use: Never used  Substance and Sexual Activity   Alcohol use: No  Drug use: No   Sexual activity: Yes    Birth control/protection: None  Other Topics Concern   Not on file  Social History Narrative   Lives with family   Social Determinants of Health   Financial Resource Strain: Low Risk  (12/17/2020)   Overall Financial Resource Strain (CARDIA)    Difficulty of Paying Living Expenses: Not hard at all  Food Insecurity: No Food Insecurity (12/17/2020)   Hunger Vital Sign    Worried About Running Out of Food in the Last Year: Never true    [place] in the Last Year: Never true  Transportation Needs: No Transportation Needs (12/17/2020)   PRAPARE - Hydrologist (Medical): No    Lack of Transportation (Non-Medical): No  Physical Activity: Insufficiently Active (12/17/2020)   Exercise Vital Sign    Days of Exercise per Week: 1 day    Minutes of Exercise per Session: 60 min  Stress: No Stress Concern Present (12/17/2020)   [place]    Feeling of Stress : Not at all  Social Connections: [place] (12/17/2020)   Social Connection and Isolation Panel [NHANES]    Frequency of Communication with Friends and Family: More than three times a week    Frequency of Social Gatherings with Friends and Family: Twice a week    Attends Religious Services: More than 4 times per year    Active Member of [employer] or Organizations: Yes    Attends Music therapist: More than 4 times per year    Marital Status: Married  Human resources officer Violence: Not At Risk (12/17/2020)   Humiliation, Afraid, Rape, and Kick questionnaire    Fear of Current or Ex-Partner: No    Emotionally Abused: No    Physically Abused: No    Sexually Abused: No     Review of Systems    General:  No chills, fever, night sweats or weight changes.  Endorses fatigue Cardiovascular:  No chest pain, endorses dyspnea on exertion, edema, but denies orthopnea, palpitations, paroxysmal nocturnal dyspnea. Dermatological: No rash, lesions/masses Respiratory: No cough, endorses dyspnea Urologic: No hematuria, dysuria Abdominal:   No nausea, vomiting, diarrhea, bright red blood per rectum, melena, or hematemesis Neurologic:  No visual changes, wkns, changes in mental status. All other systems reviewed and are otherwise negative except as noted above.   Physical Exam    VS:  BP 122/84   Pulse 73   Ht 5\' 2"  (1.575 m)   Wt 264 lb (119.7 kg)   SpO2 99%   BMI 48.29 kg/m  , BMI Body mass index is 48.29 kg/m.     GEN: Well nourished, well developed, in no acute distress. HEENT: normal. Neck: Supple, no JVD, carotid bruits, or masses. Cardiac: RRR, no murmurs, rubs, or gallops. No clubbing, cyanosis, trace pretibial edema.  Radials 2+/PT 2+ and equal bilaterally.  Respiratory:  Respirations regular and unlabored, clear to auscultation bilaterally. GI: Soft, nontender, nondistended, BS + x 4. MS: no  deformity or atrophy. Skin: warm and dry, no rash. Neuro:  Strength and sensation are intact. Psych: Normal affect.  Accessory Clinical Findings    ECG personally reviewed by me today-sinus rhythm with a rate of 69, left axis deviation, T wave inversions V3 through V6- No acute changes  Lab Results  Component Value Date   WBC 5.8 08/05/2020   HGB 12.2 08/05/2020   HCT 39.0 08/05/2020   MCV  85.5 08/05/2020   PLT 274 08/05/2020   Lab Results  Component Value Date   CREATININE 0.91 08/05/2020   BUN 11 08/05/2020   NA 136 08/05/2020   K 3.7 08/05/2020   CL 105 08/05/2020   CO2 23 08/05/2020   Lab Results  Component Value Date   ALT 13 08/03/2020   AST 15 08/03/2020   ALKPHOS 86 08/03/2020   BILITOT 0.3 08/03/2020   Lab Results  Component Value Date   CHOL 182 06/05/2019   HDL 50 06/05/2019   LDLCALC 123 (H) 06/05/2019   TRIG 43 06/05/2019   CHOLHDL 3.6 06/05/2019    No results found for: "HGBA1C"  Assessment & Plan   1.  Shortness of breath and dyspnea on exertion for the past 3 weeks.  She states she has noticed this is worsening on stairs but also on flat surfaces.  She is also noted associated symptoms of peripheral edema.  She has been scheduled for follow-up of echocardiogram.  2.  Minimal nonobstructive coronary artery disease found on left heart catheterization in 05/2019 as patient came in with significant chest discomfort with acute ST elevated myocardial infarction.  No culprit lesion found for STEMI.  She was treated for pericarditis/myocarditis with colchicine and ibuprofen.  She remains chest pain-free today.  3.  History of nonsustained V. tach noted during the evaluation of her history of pericarditis/myocarditis with palpitations increasing over the past several weeks.  She has been continued on metoprolol XL 50 mg daily with a refill sent into pharmacy of choice today patient is any other associated symptoms of lightheadedness/dizziness syncope or near  syncope.  With the palpitations and history of nonsustained VT she will be placed on a ZIO XT monitor for 2 weeks.  4.  Essential hypertension with blood pressure today of 122/84.  Remains well-controlled.  She is continued on losartan/HCTZ 100 over 25 mg daily, metoprolol succinate 50 mg daily, and furosemide 20 mg as needed.  She has been advised to continue to monitor at home as well.  5.  History of pericarditis/myocarditis in December 2020.  She is symptomatically improved.  She did complete 3 months of colchicine prior to have the medication discontinued.  Since that time she has continued to deny chest pain and is chest pain-free today.  She was previously scheduled for 8-month echo and was lost to follow-up.  Her echo has been ordered today for associated symptoms of shortness of breath and dyspnea on exertion.  6.  Disposition patient return to clinic to see MD/APP once testing is completed or sooner if needed.  Also requesting most recent labs from PCP.   Sruthi Maurer, NP 07/04/2022, 1:46 PM

## 2022-07-04 NOTE — Patient Instructions (Signed)
Medication Instructions:  Your physician recommends that you continue on your current medications as directed. Please refer to the Current Medication list given to you today.  *If you need a refill on your cardiac medications before your next appointment, please call your pharmacy*   Lab Work: NONE   If you have labs (blood work) drawn today and your tests are completely normal, you will receive your results only by: [place] (if you have MyChart) OR A paper copy in the mail If you have any lab test that is abnormal or we need to change your treatment, we will call you to review the results.   Testing/Procedures: Your physician has requested that you have an echocardiogram. Echocardiography is a painless test that uses sound waves to create images of your heart. It provides your doctor with information about the size and shape of your heart and how well your heart's chambers and valves are working. This procedure takes approximately one hour. There are no restrictions for this procedure. Please do NOT wear cologne, perfume, aftershave, or lotions (deodorant is allowed). Please arrive 15 minutes prior to your appointment time.    Follow-Up: At [facility], you and your health needs are our priority.  As part of our continuing mission to provide you with exceptional heart care, we have created designated Provider Care Teams.  These Care Teams include your primary Cardiologist (physician) and Advanced Practice Providers (APPs -  Physician Assistants and Nurse Practitioners) who all work together to provide you with the care you need, when you need it.  We recommend signing up for the patient portal called "MyChart".  Sign up information is provided on this After Visit Summary.  MyChart is used to connect with patients for Virtual Visits (Telemedicine).  Patients are able to view lab/test results, encounter notes, upcoming appointments, etc.  Non-urgent messages can be sent to  your provider as well.   To learn more about what you can do with MyChart, go to [email or web address].    Your next appointment:   6 -8 week(s)  Provider:   You may see MD or one of the following Advanced Practice Providers on your designated Care Team:   Bernerd Pho, PA-C  Ermalinda Barrios, PA-C     Other Instructions Thank you for choosing [facility]!  ZIO XT- Long Term Monitor Instructions   Your physician has requested you wear your ZIO patch monitor___14___days.   This is a single patch monitor.  [employer] supplies one patch monitor per enrollment.  Additional stickers are not available.   Please do not apply patch if you will be having a Nuclear Stress Test, Echocardiogram, Cardiac CT, MRI, or Chest Xray during the time frame you would be wearing the monitor. The patch cannot be worn during these tests.  You cannot remove and re-apply the ZIO XT patch monitor.   Your ZIO patch monitor will be sent USPS Priority mail from [facility] directly to your home address. The monitor may also be mailed to a PO BOX if home delivery is not available.   It may take 3-5 days to receive your monitor after you have been enrolled.   Once you have received you monitor, please review enclosed instructions.  Your monitor has already been registered assigning a specific monitor serial # to you.   Applying the monitor   Shave hair from upper left chest.   Hold abrader disc by orange tab.  Rub abrader in 40 strokes over left upper chest as  indicated in your monitor instructions.   Clean area with 4 enclosed alcohol pads .  Use all pads to assure are is cleaned thoroughly.  Let dry.   Apply patch as indicated in monitor instructions.  Patch will be place under collarbone on left side of chest with arrow pointing upward.   Rub patch adhesive wings for 2 minutes.Remove white label marked "1".  Remove white label marked "2".  Rub patch adhesive wings for 2 additional minutes.    While looking in a mirror, press and release button in center of patch.  A small green light will flash 3-4 times .  This will be your only indicator the monitor has been turned on.     Do not shower for the first 24 hours.  You may shower after the first 24 hours.   Press button if you feel a symptom. You will hear a small click.  Record Date, Time and Symptom in the Patient Log Book.   When you are ready to remove patch, follow instructions on last 2 pages of Patient Log Book.  Stick patch monitor onto last page of Patient Log Book.   Place Patient Log Book in Organ box.  Use locking tab on box and tape box closed securely.  The [employer] has [employer] on it.  Please place in mailbox as soon as possible.  Your physician should have your test results approximately 7 days after the monitor has been mailed back to [facility].   Call [facility] at [phone] if you have questions regarding your ZIO XT patch monitor.  Call them immediately if you see an orange light blinking on your monitor.   If your monitor falls off in less than 4 days contact our Monitor department at [phone].  If your monitor becomes loose or falls off after 4 days call [employer] at [phone] for suggestions on securing your monitor.

## 2022-07-07 ENCOUNTER — Ambulatory Visit (HOSPITAL_COMMUNITY): Admission: RE | Admit: 2022-07-07 | Payer: No Typology Code available for payment source | Source: Ambulatory Visit

## 2022-07-07 ENCOUNTER — Telehealth: Payer: Self-pay | Admitting: Cardiology

## 2022-07-07 NOTE — Telephone Encounter (Signed)
Patient called and cancelled 1/15 10:30 AM echo. She states she is currently at [facility] waiting to have an echo and she plans to bring a copy of the CD to the office once resulted. Patient also mentions that she tried calling the after hours service last night, but she didn't get anyone.

## 2022-07-08 NOTE — Telephone Encounter (Signed)
Attempted to call pt no answer no voicemail.  

## 2022-07-10 DIAGNOSIS — I493 Ventricular premature depolarization: Secondary | ICD-10-CM | POA: Diagnosis not present

## 2022-07-14 NOTE — Progress Notes (Signed)
Cardiology Office Note:    Date:  07/15/2022   ID:  Gwendolyn Edwards, DOB June 08, 1976, MRN [id number]  PCP:  Ignatius Specking, MD  [facility] Providers Cardiologist:  Nona Dell, MD     Referring MD: Ignatius Specking, MD   Chief Complaint:  Hospitalization Follow-up     History of Present Illness:   Gwendolyn Edwards is a 47 y.o. female with past medical history of pericarditis 2020, essential hypertension, nonsustained ventricular tachycardia, palpitations, anxiety, gastroesophageal reflux disease, and nonobstructive coronary artery disease cath 06/05/19 for ST elevation Hyperdynamic/normal LV systolic function with LVEF greater than 65% and normal LVEDP, suspect noncardiac chest pain.  High-sensitivity troponins were cycled echocardiogram was ordered.  Echocardiogram revealed LVEF of 50-55%, regional wall motion abnormality, LVEF is normal with an inferior septal hypokinesis, global right ventricle is mildly reduced systolic function, trivial tricuspid regurgitation.  Repeat echocardiogram in 04/04/2020 revealed LVEF of 60-65%, no regional wall motion abnormalities, mild left ventricular hypertrophy, and no valvular abnormalities. Treated for pericarditis.   She was last seen in clinic 09/28/2019 by Dr. Diona Browner.  At that time she had been continued on colchicine for 3 months and they discussed stopping current therapy with a follow-up echocardiogram within 6 months.  She was continued on her current medication regimen without changes made.   She was seen by Ms. Hammock NP 07/04/22 with DOE x 3 weeks and some edema. Zio also placed   Patient admitted to [facility] 07/06/22 with recurrent idiopathic pericarditis likely triggered by URI 3 weeks ago, HTN. Placed on colchicine for 6 months and Ibuprofen taper. Troponins negative EKD with TWI anteroseptal leads.CTA negative for pulm pathology.Exercise stress test (01.15.2024) inconclusive do to limited exercise capacity; LBBB  noted. Echo no pericardial effusion. Consider ANA and autoimmune workup after resolution of symptoms with PCP.  Patient comes in for f/u. She was short of breath, pain across shoulders, hot and clammy. She also had palpitations that made her nauseous. Still having some SOB but has to stop and take breaks. She has had to slow her pace but gradually improving. She just got the Zio last Thurs. While in the hospital she was told she had wenkebach but then it went back to Normal rhythm.      Past Medical History:  Diagnosis Date   Abscess of neck 2019   Acid reflux    Anxiety    Essential hypertension    GERD (gastroesophageal reflux disease)    Hemorrhoids    Hypertension    IBS (irritable bowel syndrome)    OA (osteoarthritis) of knee    Pericarditis    Possible pericarditis/myocarditis December 2020   Shingles 07/2020   recurrent   Snoring    Current Medications: Current Meds  Medication Sig   amLODipine (NORVASC) 5 MG tablet Take 1 tablet by mouth daily   BIOTIN PO Take 1 tablet by mouth daily.   busPIRone (BUSPAR) 10 MG tablet Take 1 tablet by mouth two times a day.   colchicine 0.6 MG tablet Take 0.6 mg by mouth 2 (two) times daily.   furosemide (LASIX) 20 MG tablet Take 1 tablet (20 mg total) by mouth 2 (two) times daily for 2 days then take 1 tablet daily as needed   gabapentin (NEURONTIN) 300 MG capsule 1 capsule (300 mg total) every morning AND 2 capsules (600 mg total) at bedtime.   losartan-hydrochlorothiazide (HYZAAR) 100-25 MG tablet Take 1 tablet by mouth every morning.   metoprolol succinate (TOPROL-XL)  50 MG 24 hr tablet Take 1 tablet by mouth daily.-- dose changed   Multiple Vitamin (MULTIVITAMIN PO) Take 1 tablet by mouth daily.   pantoprazole (PROTONIX) 40 MG tablet Take 1 tablet (40 mg total) by mouth daily.   rizatriptan (MAXALT-MLT) 10 MG disintegrating tablet Dissolve 1 tablet (10 mg total) by mouth as needed for migraine. May repeat in 2 hours if needed    topiramate (TOPAMAX) 50 MG tablet Take 1 tablet (50 mg total) by mouth 2 (two) times daily.   venlafaxine XR (EFFEXOR-XR) 150 MG 24 hr capsule Take 1 capsule by mouth daily.   venlafaxine XR (EFFEXOR-XR) 75 MG 24 hr capsule Take 1 capsule by mouth daily (in addition to 150 mg dose).   vitamin B-12 (CYANOCOBALAMIN) 100 MCG tablet Take 100 mcg by mouth daily.   [DISCONTINUED] famotidine (PEPCID) 20 MG tablet Take 20 mg by mouth daily.    Allergies:   Doxycycline and Augmentin [amoxicillin-pot clavulanate]   Social History   Tobacco Use   Smoking status: Never   Smokeless tobacco: Never  Vaping Use   Vaping Use: Never used  Substance Use Topics   Alcohol use: No   Drug use: No    Family Hx: The patient's family history includes Cancer in her father; Hepatitis C in her mother; Hypertension in her mother; Kidney disease in her mother; Thyroid disease in her sister.  ROS     Physical Exam:    VS:  BP 110/68   Pulse 76   Ht 5\' 2"  (1.575 m)   Wt 266 lb (120.7 kg)   SpO2 98%   BMI 48.65 kg/m     Wt Readings from Last 3 Encounters:  07/15/22 266 lb (120.7 kg)  07/04/22 264 lb (119.7 kg)  05/14/21 272 lb (123.4 kg)    Physical Exam  GEN: Obese, in no acute distress  Neck: no JVD, carotid bruits, or masses Cardiac:RRR; 2/6 systolic murmur no rubs, or gallops  Respiratory:  clear to auscultation bilaterally, normal work of breathing GI: soft, nontender, nondistended, + BS Ext: without cyanosis, clubbing, or edema, Good distal pulses bilaterally Neuro:  Alert and Oriented x 3, Psych: euthymic mood, full affect        EKGs/Labs/Other Test Reviewed:    EKG:  EKG is not ordered today.     Recent Labs: No results found for requested labs within last 365 days.   Recent Lipid Panel No results for input(s): "CHOL", "TRIG", "HDL", "VLDL", "LDLCALC", "LDLDIRECT" in the last 8760 hours.   Prior CV Studies:   NST 07/08/22 [facility] Impression   1.) LIMITATIONS: None.   2.) MYOCARDIAL PERFUSION: No evidence of inducible ischemia or scar.  3.) LEFT VENTRICULAR EJECTION FRACTION: Normal.  4.) REGIONAL WALL MOTION: Normal. Narrative  NUCLEAR MYOCARDIAL PERFUSION IMAGING WITH SPECT, 07/08/2022 5:56 PM   INDICATION: Chest Pain \ chest pain \ R00.2 Palpitations \ R07.2 Precordial chest pain  COMPARISON: None.   The patient denied having consumed any caffeinated and de-caffeinated beverage and food during the 24 hours preceding the pharmacologic stress test.   TECHNIQUE: A   Lexiscan  stress protocol was used. 38.7    mCi of Tc-32m Tetrofosmin was administered intravenously at rest and  12.85   mCi was administered intravenously at stress. Gated SPECT images were obtained and processed.   CLINICAL HISTORY: As above.     LIMITATIONS: None.   FINDINGS:   Unexpected: None.   Defect: None. Mild apical perfusion defect, corrected by CT  attenuation correction.   TID: <1.25   Gated rest images:   End diastolic volume: 60    cc   End systolic volume:  18   cc   Ejection fraction: 71   %.   Wall motion abnormalities: None.   Gated stress images:   End diastolic volume:  89   cc   End systolic volume: 34    cc   Ejection fraction: 61   %.   Wall motion abnormalities: None.  Echo 07/06/22      Findings:           Pericardium:  No significant pericardial effusion          LV function:  Normal (> 50% EF)          RV:  Normal          IVC collapsibility:  Normal       Interpretation:           Qualitatively normal left ventricular systolic function, No significant pericardial effusion   Electronically signed by Idamae Lusher on Sunday, July 06, 2022 at 11:44 PM  Electronically signed by Reece Agar on Tuesday, July 08, 2022 at 1:14 AM  I was present for key and critical portions of the procedure. I have reviewed the images and agree with the interpretation of the resident unless otherwise noted. Procedure Note  Gwenlyn Found, MD - 07/08/2022  Formatting of this note might be different from the original.  POCUS Cardiac        Exam Information:           Exam category:  Diagnostic          Consent obtained?:  Yes       Indication(s) for Exam:           Arrhythmia       Views:           Parasternal long-axis, Parasternal short-axis, Subxiphoid, IVC       Findings:           Pericardium:  No significant pericardial effusion          LV function:  Normal (> 50% EF)          RV:  Normal          IVC collapsibility:  Normal       Interpretation:           Qualitatively normal left ventricular systolic function, No significant pericardial effusion     Risk Assessment/Calculations/Metrics:              ASSESSMENT & PLAN:   No problem-specific Assessment & Plan notes found for this encounter.   Recurrent pericarditis 07/06/22 diagnosed at [facility]-on colchicine for 6 months. Echo, NST, CTA all done and all stable. Still having some symptoms but gradually improving on colchicine and Ibuprofen taper    HTN-BP controlled  NSVT-having palpitations and currently wearing a Zio. Continue metoprolol for now. She reports they told her she had brief Wenckebach in the hospital but I couldn't find documentation of this in care everywhere  Nonobstructive CAD on cath 2020            Dispo:  No follow-ups on file.   Medication Adjustments/Labs and Tests Ordered: Current medicines are reviewed at length with the patient today.  Concerns regarding medicines are outlined above.  Tests Ordered: No orders of the defined types were placed in this encounter.  Medication Changes: No orders  of the defined types were placed in this encounter.  Signed, Ermalinda Barrios, PA-C  07/15/2022 2:36 PM    [address], [place], [place]  [address] Phone: [phone]; Fax: [phone]

## 2022-07-15 ENCOUNTER — Ambulatory Visit: Payer: No Typology Code available for payment source | Attending: Physician Assistant | Admitting: Physician Assistant

## 2022-07-15 ENCOUNTER — Encounter: Payer: Self-pay | Admitting: Physician Assistant

## 2022-07-15 VITALS — BP 110/68 | HR 76 | Ht 62.0 in | Wt 266.0 lb

## 2022-07-15 DIAGNOSIS — I10 Essential (primary) hypertension: Secondary | ICD-10-CM | POA: Diagnosis not present

## 2022-07-15 DIAGNOSIS — I4729 Other ventricular tachycardia: Secondary | ICD-10-CM | POA: Diagnosis not present

## 2022-07-15 DIAGNOSIS — I301 Infective pericarditis: Secondary | ICD-10-CM

## 2022-07-15 DIAGNOSIS — I2510 Atherosclerotic heart disease of native coronary artery without angina pectoris: Secondary | ICD-10-CM

## 2022-07-15 NOTE — Patient Instructions (Signed)
Medication Instructions:  Your physician recommends that you continue on your current medications as directed. Please refer to the Current Medication list given to you today.   Labwork: None today  Testing/Procedures: None today  Follow-Up: Keep scheduled appointment  Any Other Special Instructions Will Be Listed Below (If Applicable).  If you need a refill on your cardiac medications before your next appointment, please call your pharmacy.

## 2022-07-15 NOTE — Telephone Encounter (Signed)
Seen by Gerrianne Scale, PA-C on 1/23

## 2022-07-31 NOTE — Progress Notes (Signed)
Overall reassuring monitor. Average heart rate of 84 bmp, rare early beats of PAC's and PVC's, no abnormal rhythms or pauses noted. Continue current medication regimen without changes.

## 2022-08-11 ENCOUNTER — Telehealth: Payer: Self-pay | Admitting: Cardiology

## 2022-08-11 NOTE — Telephone Encounter (Signed)
Left message for patient to call office for results. °

## 2022-08-11 NOTE — Telephone Encounter (Signed)
Detailed message left on pts vm- ok per pt.

## 2022-08-11 NOTE — Telephone Encounter (Signed)
Gerrie Nordmann, NP 07/31/2022 10:09 AM EST     Overall reassuring monitor. Average heart rate of 84 bmp, rare early beats of PAC's and PVC's, no abnormal rhythms or pauses noted. Continue current medication regimen without changes.            08/11/22 Phone went straight to VM,lmtcb.

## 2022-08-11 NOTE — Telephone Encounter (Signed)
Patient is calling in regards to monitor results. Requesting return call.

## 2022-08-11 NOTE — Telephone Encounter (Signed)
Patient returning call. She requests a detailed vm be left with her results.

## 2022-08-18 ENCOUNTER — Encounter: Payer: Self-pay | Admitting: Medical

## 2022-08-18 ENCOUNTER — Ambulatory Visit: Payer: No Typology Code available for payment source | Attending: Medical | Admitting: Medical

## 2022-08-18 VITALS — BP 120/80 | HR 75 | Ht 62.0 in | Wt 270.4 lb

## 2022-08-18 DIAGNOSIS — I2510 Atherosclerotic heart disease of native coronary artery without angina pectoris: Secondary | ICD-10-CM

## 2022-08-18 DIAGNOSIS — Z8679 Personal history of other diseases of the circulatory system: Secondary | ICD-10-CM

## 2022-08-18 DIAGNOSIS — I301 Infective pericarditis: Secondary | ICD-10-CM | POA: Diagnosis not present

## 2022-08-18 DIAGNOSIS — I4729 Other ventricular tachycardia: Secondary | ICD-10-CM

## 2022-08-18 DIAGNOSIS — I10 Essential (primary) hypertension: Secondary | ICD-10-CM

## 2022-08-18 NOTE — Patient Instructions (Signed)
Medication Instructions:  Your physician recommends that you continue on your current medications as directed. Please refer to the Current Medication list given to you today.  *If you need a refill on your cardiac medications before your next appointment, please call your pharmacy*   Lab Work: None If you have labs (blood work) drawn today and your tests are completely normal, you will receive your results only by: [place] (if you have MyChart) OR A paper copy in the mail If you have any lab test that is abnormal or we need to change your treatment, we will call you to review the results.   Testing/Procedures: None   Follow-Up: At [facility], you and your health needs are our priority.  As part of our continuing mission to provide you with exceptional heart care, we have created designated Provider Care Teams.  These Care Teams include your primary Cardiologist (physician) and Advanced Practice Providers (APPs -  Physician Assistants and Nurse Practitioners) who all work together to provide you with the care you need, when you need it.  We recommend signing up for the patient portal called "MyChart".  Sign up information is provided on this After Visit Summary.  MyChart is used to connect with patients for Virtual Visits (Telemedicine).  Patients are able to view lab/test results, encounter notes, upcoming appointments, etc.  Non-urgent messages can be sent to your provider as well.   To learn more about what you can do with MyChart, go to [email or web address].    Your next appointment:   2 month(s)  Provider:   You will see one of the following Advanced Practice Providers on your designated Care Team:   [place], PA-C  Ermalinda Barrios, [place]     Other Instructions

## 2022-08-18 NOTE — Progress Notes (Signed)
Cardiology Office Note:    Date:  08/18/2022   ID:  Gwendolyn Edwards, DOB February 03, 1976, MRN [id number]  PCP:  Glenda Chroman, MD  [facility] Cardiologist:  Rozann Lesches, MD  [place] Electrophysiologist:  None   Referring MD: Glenda Chroman, MD   Chief Complaint: 1 month follow-up  History of Present Illness:    Gwendolyn Edwards is a 47 y.o. female with a hx of pericarditis 2020, HTN, NSVT, palpitations, anxiety, GERD, nonobstructive CAD by cath 2020 who presents for 1 month follow-up.  The patient came in 2020 with chest pain and was taken for emergent cardiac cath. She had patent coronary arteries with minimal nonobstructive Cad, no evidence of a culprit lesion for STEMI. Hyperdynamic/normal LVSF with LVEF greater than 65% and normal LVEDP, suspect noncardiac chest pain. Echo showed LVEF 50-55%, WMA, inferior septal HK, global right ventricle is mildly reduced systolic function, trivial tricuspid regurgitation. Sed rate was elevated and she was treated with pericarditis with some improvement. The patient was on colchicine for 3 months. Repeat echo 2021 showed LVEF 60-65%, no WMA, mild LVH, no valvular abnormalities.   The patient was seen 07/04/22 reporting DOE x 3 weeks and edema. Heart monitor was placed.   The patient was admitted to [facility] 07/06/22 with recurrent idiopathic pericarditis likely triggered by URI 3 weeks prior. She was placed on colchicine for 6 month and ibuprofen taper. Troponins were negative. EKG showed TWI with anteroseptal leads. CTA negative for pulm pathology. Exercise stress test was inconclusive due to limited exercise capacity. Echo showed no pericardial effusion. Consider ANA and autoimmune work-up.   Heart monitor showed NSR with, intermittent IVCD, rare PACs/PVCs, representing less than 1% total beats, no sustained arrhythmias or pauses.  Today, the patient reports she is slowly getting back to normal. Breathing is slowly  improving. Other than that, she is feeling tired. Last time she had pericarditis. She doesn't have an appetite. She takes protein shakes. She is slowly getting stamina back. No chest pain. She reports dependent lower leg edema. She has not take lasix, she is taking HCTZ.   Past Medical History:  Diagnosis Date   Abscess of neck 2019   Acid reflux    Anxiety    Essential hypertension    GERD (gastroesophageal reflux disease)    Hemorrhoids    Hypertension    IBS (irritable bowel syndrome)    OA (osteoarthritis) of knee    Pericarditis    Possible pericarditis/myocarditis December 2020   Shingles 07/2020   recurrent   Snoring     Past Surgical History:  Procedure Laterality Date   LEFT HEART CATH AND CORONARY ANGIOGRAPHY N/A 06/05/2019   Procedure: LEFT HEART CATH AND CORONARY ANGIOGRAPHY;  Surgeon: Sherren Mocha, MD;  Location: [facility];  Service: Cardiovascular;  Laterality: N/A;   WISDOM TOOTH EXTRACTION      Current Medications: Current Meds  Medication Sig   amLODipine (NORVASC) 5 MG tablet Take 1 tablet by mouth daily   BIOTIN PO Take 1 tablet by mouth daily.   busPIRone (BUSPAR) 10 MG tablet Take 1 tablet by mouth two times a day.   colchicine 0.6 MG tablet Take 0.6 mg by mouth 2 (two) times daily.   furosemide (LASIX) 20 MG tablet Take 1 tablet (20 mg total) by mouth 2 (two) times daily for 2 days then take 1 tablet daily as needed   gabapentin (NEURONTIN) 300 MG capsule 1 capsule (300 mg total) every morning  AND 2 capsules (600 mg total) at bedtime.   HYDROcodone-acetaminophen (NORCO/VICODIN) 5-325 MG tablet Take 1 tablet by mouth every 6 (six) hours as needed.   losartan-hydrochlorothiazide (HYZAAR) 100-25 MG tablet Take 1 tablet by mouth every morning.   metoprolol succinate (TOPROL-XL) 50 MG 24 hr tablet Take 1 tablet by mouth daily.-- dose changed   Multiple Vitamin (MULTIVITAMIN PO) Take 1 tablet by mouth daily.   pantoprazole (PROTONIX) 40 MG tablet  Take 1 tablet (40 mg total) by mouth daily.   rizatriptan (MAXALT-MLT) 10 MG disintegrating tablet Dissolve 1 tablet (10 mg total) by mouth as needed for migraine. May repeat in 2 hours if needed   topiramate (TOPAMAX) 50 MG tablet Take 1 tablet (50 mg total) by mouth 2 (two) times daily.   venlafaxine XR (EFFEXOR-XR) 150 MG 24 hr capsule Take 1 capsule by mouth daily.   venlafaxine XR (EFFEXOR-XR) 75 MG 24 hr capsule Take 1 capsule by mouth daily (in addition to 150 mg dose).   vitamin B-12 (CYANOCOBALAMIN) 100 MCG tablet Take 100 mcg by mouth daily.     Allergies:   Doxycycline and Augmentin [amoxicillin-pot clavulanate]   Social History   Socioeconomic History   Marital status: Married    Spouse name: Not on file   Number of children: 2   Years of education: Not on file   Highest education level: Not on file  Occupational History    Comment: [employer]  Tobacco Use   Smoking status: Never   Smokeless tobacco: Never  Vaping Use   Vaping Use: Never used  Substance and Sexual Activity   Alcohol use: No   Drug use: No   Sexual activity: Yes    Birth control/protection: None  Other Topics Concern   Not on file  Social History Narrative   Lives with family   Social Determinants of Health   Financial Resource Strain: Low Risk  (12/17/2020)   Overall Financial Resource Strain (CARDIA)    Difficulty of Paying Living Expenses: Not hard at all  Food Insecurity: No Food Insecurity (12/17/2020)   Hunger Vital Sign    Worried About Running Out of Food in the Last Year: Never true    [place] in the Last Year: Never true  Transportation Needs: No Transportation Needs (12/17/2020)   PRAPARE - Hydrologist (Medical): No    Lack of Transportation (Non-Medical): No  Physical Activity: Insufficiently Active (12/17/2020)   Exercise Vital Sign    Days of Exercise per Week: 1 day    Minutes of Exercise per Session: 60 min  Stress: No Stress Concern  Present (12/17/2020)   [place]    Feeling of Stress : Not at all  Social Connections: [place] (12/17/2020)   Social Connection and Isolation Panel [NHANES]    Frequency of Communication with Friends and Family: More than three times a week    Frequency of Social Gatherings with Friends and Family: Twice a week    Attends Religious Services: More than 4 times per year    Active Member of [employer] or Organizations: Yes    Attends Music therapist: More than 4 times per year    Marital Status: Married     Family History: The patient's family history includes Cancer in her father; Hepatitis C in her mother; Hypertension in her mother; Kidney disease in her mother; Thyroid disease in her sister.  ROS:  Please see the history of present illness.     All other systems reviewed and are negative.  EKGs/Labs/Other Studies Reviewed:    The following studies were reviewed today:  Heart monitor 07/2022 ZIO XT reviewed.  10 days, 9 hours analyzed.   Predominant rhythm is sinus with heart rate ranging from 54 bpm up to 140 bpm and average heart rate 84 bpm.  Intermittent IVCD noted. There were rare PACs and PVCs, both representing less than 1% total beats. No sustained arrhythmias or pauses.  Echo 2021 1. Left ventricular ejection fraction, by estimation, is 60 to 65%. The  left ventricle has normal function. The left ventricle has no regional  wall motion abnormalities. There is mild left ventricular hypertrophy.  Left ventricular diastolic parameters  were normal.   2. Right ventricular systolic function is normal. The right ventricular  size is normal. Tricuspid regurgitation signal is inadequate for assessing  PA pressure.   3. The mitral valve is grossly normal. No evidence of mitral valve  regurgitation.   4. The aortic valve is tricuspid. Aortic valve regurgitation is not  visualized.   5. The  inferior vena cava is normal in size with greater than 50%  respiratory variability, suggesting right atrial pressure of 3 mmHg.   LHC 05/2019 1.  Patent coronary arteries with minimal nonobstructive coronary artery disease, no evidence of a culprit lesion for STEMI. 2.  Hyperdynamic/normal LV systolic function with LVEF greater than 65% and normal LVEDP   Suspect noncardiac chest pain.  We will cycle high-sensitivity troponins, check an echocardiogram, and if symptoms resolve and study results are reassuring, likely discharge home tomorrow.  EKG:  EKG is not ordered today.    Recent Labs: No results found for requested labs within last 365 days.  Recent Lipid Panel    Component Value Date/Time   CHOL 182 06/05/2019 1303   TRIG 43 06/05/2019 1303   HDL 50 06/05/2019 1303   CHOLHDL 3.6 06/05/2019 1303   VLDL 9 06/05/2019 1303   LDLCALC 123 (H) 06/05/2019 1303     Physical Exam:    VS:  BP 120/80   Pulse 75   Ht '5\' 2"'$  (1.575 m)   Wt 270 lb 6.4 oz (122.7 kg)   SpO2 98%   BMI 49.46 kg/m     Wt Readings from Last 3 Encounters:  08/18/22 270 lb 6.4 oz (122.7 kg)  07/15/22 266 lb (120.7 kg)  07/04/22 264 lb (119.7 kg)     GEN:  Well nourished, well developed in no acute distress HEENT: Normal NECK: No JVD; No carotid bruits LYMPHATICS: No lymphadenopathy CARDIAC: RRR, no murmurs, rubs, gallops RESPIRATORY:  Clear to auscultation without rales, wheezing or rhonchi  ABDOMEN: Soft, non-tender, non-distended MUSCULOSKELETAL:  No edema; No deformity  SKIN: Warm and dry NEUROLOGIC:  Alert and oriented x 3 PSYCHIATRIC:  Normal affect   ASSESSMENT:    1. Acute viral pericarditis   2. Coronary artery disease involving native coronary artery of native heart without angina pectoris   3. NSVT (nonsustained ventricular tachycardia) ([place])   4. History of pericarditis   5. Essential hypertension    PLAN:    In order of problems listed above:  Recurrent Idiopathic  pericarditis Patient was admitted in January 2024 at [facility] for recurrent idiopathic pericarditis, likely triggered by URI 3 weeks prior.  She was been started on colchicine x 6 months with ibuprofen taper. Patient overall feels she is improving, however she does notice persistent shortness of  breath and fatigue, which I suspect is mostly recovery from recent admission. Patient denies any chest pain. Continue colchicine 0.6 mg x 6 months. She is also on a PPI. Repeat echo has been ordered, we will schedule this.  Minimal nonobstructive CAD Patient denies any chest pain.  Nonobstructive CAD by cath in 2020.  No further ischemic workup at this time.  History of nonsustained VT This was in the setting of pericarditis/myocarditis with palpitations. She denies any palpitations.  Continue Toprol 50 mg daily.  Most recent heart monitor showed sinus rhythm, with no significant arrhythmias.  Hypertension Pressure is well-controlled, continue Losartan-HCTA and Toprol.  Disposition: Follow up in 5 month(s) with MD/APP   Signed, Baldomero Mirarchi Ninfa [place], PA-C  08/18/2022 2:28 PM    [facility]

## 2022-08-21 ENCOUNTER — Encounter: Payer: Self-pay | Admitting: Radiology

## 2022-09-15 ENCOUNTER — Ambulatory Visit: Payer: Self-pay

## 2022-09-17 ENCOUNTER — Ambulatory Visit (HOSPITAL_COMMUNITY)
Admission: RE | Admit: 2022-09-17 | Discharge: 2022-09-17 | Disposition: A | Discharge status: Home / Self Care | Payer: No Typology Code available for payment source | Source: Ambulatory Visit | Attending: Cardiology | Admitting: Cardiology

## 2022-09-17 DIAGNOSIS — R0602 Shortness of breath: Secondary | ICD-10-CM | POA: Diagnosis not present

## 2022-09-17 LAB — ECHOCARDIOGRAM COMPLETE
AR max vel: 1.96 cm2
AV Area VTI: 1.98 cm2
AV Area mean vel: 2.1 cm2
AV Mean grad: 6 mmHg
AV Peak grad: 12.3 mmHg
Ao pk vel: 1.75 m/s
Area-P 1/2: 3.17 cm2
S' Lateral: 2.2 cm

## 2022-09-17 NOTE — Progress Notes (Signed)
*  PRELIMINARY RESULTS* Echocardiogram 2D Echocardiogram has been performed.  Gwendolyn Edwards 09/17/2022, 11:46 AM

## 2022-09-19 ENCOUNTER — Telehealth: Payer: Self-pay

## 2022-09-19 NOTE — Telephone Encounter (Signed)
Patient notified and verbalized understanding. Patient had no questions or concerns at this time. PCP copied 

## 2022-09-19 NOTE — Telephone Encounter (Signed)
-----   Message from Gerrie Nordmann, NP sent at 09/19/2022  2:27 PM EDT ----- Heart squeeze is 65-70%, no wall motion abnormality was noted, mild enlargement noted likely related to blood pressure, valves are unchanged, overall the study is reassuring

## 2022-09-19 NOTE — Progress Notes (Signed)
Heart squeeze is 65-70%, no wall motion abnormality was noted, mild enlargement noted likely related to blood pressure, valves are unchanged, overall the study is reassuring

## 2022-10-09 ENCOUNTER — Encounter: Payer: Self-pay | Admitting: Emergency Medicine

## 2022-10-09 ENCOUNTER — Ambulatory Visit
Admission: EM | Admit: 2022-10-09 | Discharge: 2022-10-09 | Disposition: A | Discharge status: Home / Self Care | Payer: PRIVATE HEALTH INSURANCE | Attending: Nurse Practitioner | Admitting: Nurse Practitioner

## 2022-10-09 DIAGNOSIS — G43919 Migraine, unspecified, intractable, without status migrainosus: Secondary | ICD-10-CM | POA: Diagnosis not present

## 2022-10-09 DIAGNOSIS — M25512 Pain in left shoulder: Secondary | ICD-10-CM | POA: Diagnosis not present

## 2022-10-09 DIAGNOSIS — M25511 Pain in right shoulder: Secondary | ICD-10-CM

## 2022-10-09 MED ORDER — ONDANSETRON 4 MG PO TBDP
4.0000 mg | ORAL_TABLET | Freq: Once | ORAL | Status: AC
  Administered 2022-10-09: 4 mg via ORAL

## 2022-10-09 MED ORDER — KETOROLAC TROMETHAMINE 30 MG/ML IJ SOLN
30.0000 mg | Freq: Once | INTRAMUSCULAR | Status: AC
  Administered 2022-10-09: 30 mg via INTRAMUSCULAR

## 2022-10-09 MED ORDER — IBUPROFEN 800 MG PO TABS: 800.0000 mg | ORAL_TABLET | Freq: Three times a day (TID) | ORAL | 0 refills | Status: DC | PRN

## 2022-10-09 MED ORDER — METHOCARBAMOL 500 MG PO TABS: 500.0000 mg | ORAL_TABLET | Freq: Two times a day (BID) | ORAL | 0 refills | Status: DC

## 2022-10-09 MED ORDER — SUMATRIPTAN SUCCINATE 6 MG/0.5ML ~~LOC~~ SOLN
6.0000 mg | Freq: Once | SUBCUTANEOUS | Status: AC
  Administered 2022-10-09: 6 mg via SUBCUTANEOUS

## 2022-10-09 MED ORDER — DEXAMETHASONE SODIUM PHOSPHATE 10 MG/ML IJ SOLN
10.0000 mg | Freq: Once | INTRAMUSCULAR | Status: AC
  Administered 2022-10-09: 10 mg via INTRAMUSCULAR

## 2022-10-09 MED ORDER — ONDANSETRON 4 MG PO TBDP: 4.0000 mg | ORAL_TABLET | Freq: Three times a day (TID) | ORAL | 0 refills | Status: DC | PRN

## 2022-10-09 NOTE — ED Triage Notes (Signed)
Bilateral shoulder pain that radiates to back x 2 days.  States her whole body aches.  Headache x 3 days.

## 2022-10-09 NOTE — ED Provider Notes (Signed)
[facility]    CSN: [id number] Arrival date & time: 10/09/22  1611      History   Chief Complaint Chief Complaint  Patient presents with   Shoulder Pain    Shoulder pain radiating down my back - Entered by patient    HPI Gwendolyn Edwards is a 47 y.o. female.   The history is provided by the patient.   The patient presents for complaints of headache pain, and bilateral shoulder pain.  Patient states symptoms started approximately 3 days ago.  Headache is located in the right side of her head behind the right eye.  She states she developed a headache first, now she has stiffness, tightness, and pain in her bilateral shoulders.  Patient reports she has a history of migraine headaches.  Reports that she takes topiramate daily.  She states she does have an aura with her headaches, specifically when she is around the sunlight.  She complains of nausea, light sensitivity, and sound sensitivity.  Patient states she has not seen neurology in quite some time, but states that she is planning on following up.  She denies fever, chills, chest pain, abdominal pain, vomiting, or diarrhea.  Patient states that her headaches appear to be becoming more debilitating.  Past Medical History:  Diagnosis Date   Abscess of neck 2019   Acid reflux    Anxiety    Essential hypertension    GERD (gastroesophageal reflux disease)    Hemorrhoids    Hypertension    IBS (irritable bowel syndrome)    OA (osteoarthritis) of knee    Pericarditis    Possible pericarditis/myocarditis December 2020   Shingles 07/2020   recurrent   Snoring     Patient Active Problem List   Diagnosis Date Noted   Patient desires pregnancy 12/17/2020   Encounter for screening fecal occult blood testing 12/17/2020   Essential hypertension    Acid reflux    Anxiety    Pericarditis 06/08/2019   Chest pain at rest 06/05/2019   Hypertensive heart disease 12/01/2018   Screening for colorectal cancer 12/01/2018    Encounter for gynecological examination with Papanicolaou smear of cervix 12/01/2018   Abscess of neck 02/08/2018    Past Surgical History:  Procedure Laterality Date   LEFT HEART CATH AND CORONARY ANGIOGRAPHY N/A 06/05/2019   Procedure: LEFT HEART CATH AND CORONARY ANGIOGRAPHY;  Surgeon: Tonny Bollman, MD;  Location: [facility] INVASIVE CV LAB;  Service: Cardiovascular;  Laterality: N/A;   WISDOM TOOTH EXTRACTION      OB History     Gravida  5   Para  2   Term  2   Preterm      AB  3   Living  2      SAB      IAB      Ectopic      Multiple      Live Births  1            Home Medications    Prior to Admission medications   Medication Sig Start Date End Date Taking? Authorizing Provider  ibuprofen (ADVIL) 800 MG tablet Take 1 tablet (800 mg total) by mouth every 8 (eight) hours as needed for headache. 10/09/22  Yes Mariska Daffin-Warren, Sadie Haber, NP  methocarbamol (ROBAXIN) 500 MG tablet Take 1 tablet (500 mg total) by mouth 2 (two) times daily. 10/09/22  Yes Zori Benbrook-Warren, Sadie Haber, NP  ondansetron (ZOFRAN-ODT) 4 MG disintegrating tablet Take 1 tablet (4 mg total)  by mouth every 8 (eight) hours as needed. 10/09/22  Yes Ronalee Scheunemann-Warren, Sadie Haber, NP  amLODipine (NORVASC) 5 MG tablet Take 1 tablet by mouth daily 01/31/22     BIOTIN PO Take 1 tablet by mouth daily.    [provider]  busPIRone (BUSPAR) 10 MG tablet Take 1 tablet by mouth two times a day. 08/14/21     colchicine 0.6 MG tablet Take 0.6 mg by mouth 2 (two) times daily. 07/09/22   [provider]  furosemide (LASIX) 20 MG tablet Take 1 tablet (20 mg total) by mouth 2 (two) times daily for 2 days then take 1 tablet daily as needed 03/06/22     gabapentin (NEURONTIN) 300 MG capsule 1 capsule (300 mg total) every morning AND 2 capsules (600 mg total) at bedtime. 04/17/22     HYDROcodone-acetaminophen (NORCO/VICODIN) 5-325 MG tablet Take 1 tablet by mouth every 6 (six) hours as needed. 04/19/21    Couture, Cortni S, PA-C  losartan-hydrochlorothiazide (HYZAAR) 100-25 MG tablet Take 1 tablet by mouth every morning. 04/17/22     metoprolol succinate (TOPROL-XL) 50 MG 24 hr tablet Take 1 tablet by mouth daily.-- dose changed 07/04/22   Charlsie Quest, NP  Multiple Vitamin (MULTIVITAMIN PO) Take 1 tablet by mouth daily.    [provider]  pantoprazole (PROTONIX) 40 MG tablet Take 1 tablet (40 mg total) by mouth daily. 05/01/22     rizatriptan (MAXALT-MLT) 10 MG disintegrating tablet Dissolve 1 tablet (10 mg total) by mouth as needed for migraine. May repeat in 2 hours if needed 04/24/21   Penumalli, Glenford Bayley, MD  topiramate (TOPAMAX) 50 MG tablet Take 1 tablet (50 mg total) by mouth 2 (two) times daily. 04/24/21   Penumalli, Glenford Bayley, MD  venlafaxine XR (EFFEXOR-XR) 150 MG 24 hr capsule Take 1 capsule by mouth daily. 02/04/22     venlafaxine XR (EFFEXOR-XR) 75 MG 24 hr capsule Take 1 capsule by mouth daily (in addition to 150 mg dose). 07/12/21     vitamin B-12 (CYANOCOBALAMIN) 100 MCG tablet Take 100 mcg by mouth daily.    [provider]    Family History Family History  Problem Relation Age of Onset   Cancer Father        Osteosarcoma    Kidney disease Mother    Hepatitis C Mother    Hypertension Mother    Thyroid disease Sister     Social History Social History   Tobacco Use   Smoking status: Never   Smokeless tobacco: Never  Vaping Use   Vaping Use: Never used  Substance Use Topics   Alcohol use: No   Drug use: No     Allergies   Doxycycline and Augmentin [amoxicillin-pot clavulanate]   Review of Systems Review of Systems Per HPI  Physical Exam Triage Vital Signs ED Triage Vitals  Enc Vitals Group     BP 10/09/22 1704 (!) 141/83     Pulse Rate 10/09/22 1704 73     Resp 10/09/22 1704 18     Temp 10/09/22 1704 98 F (36.7 C)     Temp Source 10/09/22 1704 Oral     SpO2 10/09/22 1704 99 %     Weight --      Height --      Head Circumference --       Peak Flow --      Pain Score 10/09/22 1705 7     Pain Loc --      Pain  Edu? --      Excl. in GC? --    No data found.  Updated Vital Signs BP (!) 141/83 (BP Location: Right Arm)   Pulse 73   Temp 98 F (36.7 C) (Oral)   Resp 18   LMP 10/01/2022 (Exact Date)   SpO2 99%   Visual Acuity Right Eye Distance:   Left Eye Distance:   Bilateral Distance:    Right Eye Near:   Left Eye Near:    Bilateral Near:     Physical Exam Vitals and nursing note reviewed.  Constitutional:      General: She is in acute distress (Appears uncomfortable due to headache and shoulder pain).     Appearance: Normal appearance.  HENT:     Head: Normocephalic.     Right Ear: Tympanic membrane, ear canal and external ear normal.     Left Ear: Tympanic membrane, ear canal and external ear normal.     Mouth/Throat:     Mouth: Mucous membranes are moist.  Eyes:     Extraocular Movements: Extraocular movements intact.     Conjunctiva/sclera: Conjunctivae normal.     Pupils: Pupils are equal, round, and reactive to light.  Cardiovascular:     Rate and Rhythm: Normal rate and regular rhythm.     Pulses: Normal pulses.     Heart sounds: Normal heart sounds.  Pulmonary:     Effort: Pulmonary effort is normal. No respiratory distress.     Breath sounds: Normal breath sounds. No stridor. No wheezing, rhonchi or rales.  Abdominal:     General: Bowel sounds are normal.     Palpations: Abdomen is soft.     Tenderness: There is no abdominal tenderness.  Musculoskeletal:     Cervical back: Normal range of motion.  Lymphadenopathy:     Cervical: No cervical adenopathy.  Skin:    General: Skin is warm and dry.  Neurological:     General: No focal deficit present.     Mental Status: She is alert and oriented to person, place, and time.  Psychiatric:        Mood and Affect: Mood normal.        Behavior: Behavior normal.      UC Treatments / Results  Labs (all labs ordered are listed, but only  abnormal results are displayed) Labs Reviewed - No data to display  EKG   Radiology No results found.  Procedures Procedures (including critical care time)  Medications Ordered in UC Medications  ketorolac (TORADOL) 30 MG/ML injection 30 mg (30 mg Intramuscular Given 10/09/22 1804)  dexamethasone (DECADRON) injection 10 mg (10 mg Intramuscular Given 10/09/22 1803)  SUMAtriptan (IMITREX) injection 6 mg (6 mg Subcutaneous Given 10/09/22 1803)  ondansetron (ZOFRAN-ODT) disintegrating tablet 4 mg (4 mg Oral Given 10/09/22 1803)    Initial Impression / Assessment and Plan / UC Course  I have reviewed the triage vital signs and the nursing notes.  Pertinent labs & imaging results that were available during my care of the patient were reviewed by me and considered in my medical decision making (see chart for details).  Patient presents for complaints of headache, and bilateral shoulder pain.  Neurological exam is within normal limits.  Shoulder pain most likely caused by the patient's headache pain.  Patient was administered Toradol 30 mg IM, Decadron 10 mg IM, Imitrex 6 mg subcutaneously, and Zofran 4 mg by mouth.  Patient was prescribed ondansetron 4 mg for her nausea, ibuprofen 800 mg for  headache pain and shoulder pain, and methocarbamol 500 mg as a muscle relaxer.  Supportive care recommendations were provided and discussed with the patient to include the use of ice or heat, along with gentle stretching and range of motion exercises while symptoms persist.  With regard to her headache, patient advised to follow-up with neurology for reevaluation.  Patient was given ER follow-up precautions.  Patient is in agreement with this plan of care and verbalizes understanding.  All questions were answered.  Patient stable for discharge.  Work note was provided.   Final Clinical Impressions(s) / UC Diagnoses   Final diagnoses:  Intractable migraine without status migrainosus, unspecified migraine type   Bilateral shoulder pain, unspecified chronicity     Discharge Instructions      Take medication as prescribed. Increase fluids and allow for plenty of rest. Recommend staying in a dimly lit room with decreased sound and noise while symptoms persist. Continue your regular migraine headache medication medicine. If you develop worsening headache with numbness, tingling, weakness, become unable to speak in a complete sentence, or have a change in your mental status, please go to the emergency department immediately for further evaluation. As discussed, it is recommended that you follow-up with the neurologist for further evaluation for increased intensity of your headaches.   For your shoulder pain: Take medication as prescribed. Recommend the use of ice or heat.  Apply ice for pain or swelling, heat for spasm or stiffness.  Apply for 20 minutes, remove for 1 hour, then repeat as needed. Gentle stretching and range of motion exercises while symptoms persist. If symptoms fail to improve, please follow-up with your regular physician or orthopedics for further evaluation. Follow-up as needed. Follow-up as needed.     ED Prescriptions     Medication Sig Dispense Auth. Provider   ondansetron (ZOFRAN-ODT) 4 MG disintegrating tablet Take 1 tablet (4 mg total) by mouth every 8 (eight) hours as needed. 20 tablet Heran Campau-Warren, Sadie Haber, NP   ibuprofen (ADVIL) 800 MG tablet Take 1 tablet (800 mg total) by mouth every 8 (eight) hours as needed for headache. 30 tablet Teela Narducci-Warren, Sadie Haber, NP   methocarbamol (ROBAXIN) 500 MG tablet Take 1 tablet (500 mg total) by mouth 2 (two) times daily. 20 tablet Jerard Bays-Warren, Sadie Haber, NP      PDMP not reviewed this encounter.   Abran Cantor, NP 10/09/22 1807

## 2022-10-09 NOTE — Discharge Instructions (Addendum)
Take medication as prescribed. Increase fluids and allow for plenty of rest. Recommend staying in a dimly lit room with decreased sound and noise while symptoms persist. Continue your regular migraine headache medication medicine. If you develop worsening headache with numbness, tingling, weakness, become unable to speak in a complete sentence, or have a change in your mental status, please go to the emergency department immediately for further evaluation. As discussed, it is recommended that you follow-up with the neurologist for further evaluation for increased intensity of your headaches.   For your shoulder pain: Take medication as prescribed. Recommend the use of ice or heat.  Apply ice for pain or swelling, heat for spasm or stiffness.  Apply for 20 minutes, remove for 1 hour, then repeat as needed. Gentle stretching and range of motion exercises while symptoms persist. If symptoms fail to improve, please follow-up with your regular physician or orthopedics for further evaluation. Follow-up as needed. Follow-up as needed.

## 2023-01-28 ENCOUNTER — Encounter: Payer: Self-pay | Admitting: Cardiology

## 2023-01-28 ENCOUNTER — Ambulatory Visit: Payer: PRIVATE HEALTH INSURANCE | Attending: Cardiology | Admitting: Cardiology

## 2023-01-28 VITALS — BP 116/80 | HR 61 | Ht 62.0 in | Wt 267.0 lb

## 2023-01-28 DIAGNOSIS — I493 Ventricular premature depolarization: Secondary | ICD-10-CM

## 2023-01-28 DIAGNOSIS — Z8679 Personal history of other diseases of the circulatory system: Secondary | ICD-10-CM

## 2023-01-28 DIAGNOSIS — I10 Essential (primary) hypertension: Secondary | ICD-10-CM

## 2023-01-28 MED ORDER — METOPROLOL SUCCINATE ER 100 MG PO TB24: 100.0000 mg | ORAL_TABLET | Freq: Every day | ORAL | 3 refills | Status: DC

## 2023-01-28 NOTE — Patient Instructions (Signed)
Medication Instructions:  Your physician recommends that you continue on your current medications as directed. Please refer to the Current Medication list given to you today.   Labwork: None today  Testing/Procedures: None today  Follow-Up: 6 months  Any Other Special Instructions Will Be Listed Below (If Applicable).  If you need a refill on your cardiac medications before your next appointment, please call your pharmacy.  

## 2023-01-28 NOTE — Progress Notes (Signed)
Cardiology Office Note  Date: 01/28/2023   ID: Gwendolyn Edwards, DOB [birth year], MRN [id number]  History of Present Illness: Gwendolyn Edwards is a 47 y.o. female last seen in February by Ms. Lorna Few, I reviewed the note (our last visit was in 2021).  She has a history of minimal coronary atherosclerosis at cardiac catheterization in 2020, subsequent diagnosis of idiopathic pericarditis most recently in January of this year treated at [facility].  She presents for a routine visit, overall doing well.  No chest pain or breathlessness.  Has had intermittent palpitations consistent with PVCs and increased her Toprol-XL to 100 mg daily which has helped significantly.  She has had no dizziness or syncope.  Follow-up echocardiogram in March revealed LVEF 65 to 70% with mild LVH and no regional wall motion abnormalities, no significant pericardial effusion.  She continues to follow with Dr. Sherril Croon for primary care.  Physical Exam: VS:  BP 116/80   Pulse 61   Ht 5\' 2"  (1.575 m)   Wt 267 lb (121.1 kg)   SpO2 100%   BMI 48.83 kg/m , BMI Body mass index is 48.83 kg/m.  Wt Readings from Last 3 Encounters:  01/28/23 267 lb (121.1 kg)  08/18/22 270 lb 6.4 oz (122.7 kg)  07/15/22 266 lb (120.7 kg)    General: Patient appears comfortable at rest. HEENT: Conjunctiva and lids normal. Neck: Supple, no elevated JVP or carotid bruits. Lungs: Clear to auscultation, nonlabored breathing at rest. Cardiac: Regular rate and rhythm, no S3 or significant systolic murmur, no pericardial rub.  ECG:  An ECG dated 07/04/2022 was personally reviewed today and demonstrated:  Sinus rhythm with nonspecific T wave abnormalities.  Labwork:  January 2024: Hemoglobin 12.3, platelets 327, potassium 4.2, BUN 14, creatinine 0.88, magnesium 1.9, cholesterol 176, glycerides 66, HDL 48, LDL 119, TSH 2.24, CRP 18.5, ESR 108  Other Studies Reviewed Today:  Echocardiogram 09/17/2022:  1. Left  ventricular ejection fraction, by estimation, is 65 to 70%. The  left ventricle has normal function. The left ventricle has no regional  wall motion abnormalities. There is mild concentric left ventricular  hypertrophy. Left ventricular diastolic  parameters are consistent with Grade II diastolic dysfunction  (pseudonormalization).   2. Right ventricular systolic function is normal. The right ventricular  size is normal. Tricuspid regurgitation signal is inadequate for assessing  PA pressure.   3. The mitral valve is grossly normal. Trivial mitral valve  regurgitation.   4. The aortic valve is tricuspid. Aortic valve regurgitation is not  visualized. No aortic stenosis is present. Aortic valve mean gradient  measures 6.0 mmHg.   5. The inferior vena cava is normal in size with greater than 50%  respiratory variability, suggesting right atrial pressure of 3 mmHg.   Assessment and Plan:  1.  History of recurrent idiopathic pericarditis, most recently diagnosed in January of this year at [facility].  Currently symptom-free.  Follow-up echocardiogram in March revealed LVEF 65 to 70% without regional motion normalities and no significant pericardial effusion.  She is no longer on colchicine or nonsteroidal therapy.  Plan to follow-up with PCP regarding general screening lab work for connective tissue disease.  2.  Essential hypertension.  Blood pressure well-controlled today.  Continue Norvasc and Hyzaar.  3.  History of minor coronary atherosclerosis at cardiac catheterization in 2020.  4.  Palpitations consistent with PVCs.  No sudden dizziness or syncope.  Currently symptoms well-controlled on Toprol-XL 100 mg daily which will  be continued.  Disposition:  Follow up  6 months.  Signed, Jonelle Sidle, M.D., F.A.C.C.  [facility] at [facility]

## 2023-03-28 ENCOUNTER — Other Ambulatory Visit: Payer: Self-pay

## 2023-03-28 ENCOUNTER — Encounter (HOSPITAL_COMMUNITY): Payer: Self-pay | Admitting: Emergency Medicine

## 2023-03-28 ENCOUNTER — Emergency Department (HOSPITAL_COMMUNITY)
Admission: EM | Admit: 2023-03-28 | Discharge: 2023-03-28 | Disposition: A | Discharge status: Home / Self Care | Payer: PRIVATE HEALTH INSURANCE | Attending: Emergency Medicine | Admitting: Emergency Medicine

## 2023-03-28 DIAGNOSIS — W25XXXA Contact with sharp glass, initial encounter: Secondary | ICD-10-CM | POA: Diagnosis not present

## 2023-03-28 DIAGNOSIS — Z79899 Other long term (current) drug therapy: Secondary | ICD-10-CM | POA: Insufficient documentation

## 2023-03-28 DIAGNOSIS — S71112A Laceration without foreign body, left thigh, initial encounter: Secondary | ICD-10-CM | POA: Diagnosis present

## 2023-03-28 DIAGNOSIS — I10 Essential (primary) hypertension: Secondary | ICD-10-CM | POA: Insufficient documentation

## 2023-03-28 MED ORDER — LIDOCAINE-EPINEPHRINE-TETRACAINE (LET) TOPICAL GEL
3.0000 mL | Freq: Once | TOPICAL | Status: DC
  Filled 2023-03-28: qty 3

## 2023-03-28 NOTE — ED Triage Notes (Signed)
Pt c/o laceration to left knee after broken glass jar hit her leg.

## 2023-03-28 NOTE — ED Provider Notes (Signed)
[place] EMERGENCY DEPARTMENT AT [facility] Provider Note   CSN: [id number] Arrival date & time: 03/28/23  2004     History  Chief Complaint  Patient presents with   Extremity Laceration    Gwendolyn Edwards is a 47 y.o. female with past medical history of GERD, anxiety, hypertension presents to emergency department following being cut by glass.  Patient reports that she was trying to catch a glass spaghetti jar that fell from shelf and glass cut her left leg.  She denies any additional injuries, foreign body.  HPI     Home Medications Prior to Admission medications   Medication Sig Start Date End Date Taking? Authorizing Provider  amLODipine (NORVASC) 5 MG tablet Take 1 tablet by mouth daily 01/31/22     BIOTIN PO Take 1 tablet by mouth daily.    [provider]  busPIRone (BUSPAR) 10 MG tablet Take 1 tablet by mouth two times a day. 08/14/21     famotidine (PEPCID) 20 MG tablet Take 20 mg by mouth daily. 07/07/22   [provider]  furosemide (LASIX) 20 MG tablet Take 1 tablet (20 mg total) by mouth 2 (two) times daily for 2 days then take 1 tablet daily as needed Patient taking differently: Take 20 mg by mouth as needed. 03/06/22     gabapentin (NEURONTIN) 300 MG capsule 1 capsule (300 mg total) every morning AND 2 capsules (600 mg total) at bedtime. 04/17/22     losartan-hydrochlorothiazide (HYZAAR) 100-25 MG tablet Take 1 tablet by mouth every morning. 04/17/22     methocarbamol (ROBAXIN) 500 MG tablet Take 1 tablet (500 mg total) by mouth 2 (two) times daily. 10/09/22   Leath-Warren, Sadie Haber, NP  metoprolol succinate (TOPROL-XL) 100 MG 24 hr tablet Take 1 tablet (100 mg total) by mouth daily. Take with or immediately following a meal. 01/28/23 04/28/23  Jonelle Sidle, MD  Multiple Vitamin (MULTIVITAMIN PO) Take 1 tablet by mouth daily.    [provider]  ondansetron (ZOFRAN-ODT) 4 MG disintegrating tablet Take 1 tablet (4 mg  total) by mouth every 8 (eight) hours as needed. 10/09/22   Leath-Warren, Sadie Haber, NP  rizatriptan (MAXALT-MLT) 10 MG disintegrating tablet Dissolve 1 tablet (10 mg total) by mouth as needed for migraine. May repeat in 2 hours if needed 04/24/21   Penumalli, Glenford Bayley, MD  topiramate (TOPAMAX) 50 MG tablet Take 1 tablet (50 mg total) by mouth 2 (two) times daily. 04/24/21   Penumalli, Glenford Bayley, MD  venlafaxine XR (EFFEXOR-XR) 150 MG 24 hr capsule Take 1 capsule by mouth daily. 02/04/22     venlafaxine XR (EFFEXOR-XR) 75 MG 24 hr capsule Take 1 capsule by mouth daily (in addition to 150 mg dose). 07/12/21     vitamin B-12 (CYANOCOBALAMIN) 100 MCG tablet Take 100 mcg by mouth daily.    [provider]      Allergies    Penicillins, Doxycycline, and Augmentin [amoxicillin-pot clavulanate]    Review of Systems   Review of Systems  Constitutional:  Negative for chills, fatigue and fever.  Respiratory:  Negative for cough, chest tightness, shortness of breath and wheezing.   Cardiovascular:  Negative for chest pain and palpitations.  Gastrointestinal:  Negative for abdominal pain, constipation, diarrhea, nausea and vomiting.  Neurological:  Negative for dizziness, seizures, weakness, light-headedness, numbness and headaches.    Physical Exam Updated Vital Signs BP (!) 146/90 (BP Location: Right Wrist)   Pulse 88   Temp 97.9  F (36.6 C) (Oral)   Resp 18   Ht 5\' 2"  (1.575 m)   Wt 121.1 kg   SpO2 100%   BMI 48.83 kg/m  Physical Exam Vitals and nursing note reviewed.  Constitutional:      General: She is not in acute distress.    Appearance: Normal appearance.  HENT:     Head: Normocephalic and atraumatic.  Eyes:     Conjunctiva/sclera: Conjunctivae normal.  Cardiovascular:     Rate and Rhythm: Normal rate.     Pulses: Normal pulses.  Pulmonary:     Effort: Pulmonary effort is normal. No respiratory distress.  Skin:    Coloration: Skin is not jaundiced or pale.      Comments: 5 cm well-approximated linear laceration to left thigh No active hemorrhage, warmth, erythema, swelling to lac  Neurological:     Mental Status: She is alert. Mental status is at baseline.     ED Results / Procedures / Treatments   Labs (all labs ordered are listed, but only abnormal results are displayed) Labs Reviewed - No data to display  EKG None  Radiology No results found.  Procedures .Marland KitchenLaceration Repair  Date/Time: 03/28/2023 10:00 PM  Performed by: Judithann Sheen, PA Authorized by: Judithann Sheen, PA   Consent:    Consent obtained:  Verbal   Consent given by:  Patient   Risks, benefits, and alternatives were discussed: yes     Risks discussed:  Infection, pain and poor cosmetic result   Alternatives discussed:  No treatment Universal protocol:    Procedure explained and questions answered to patient or proxy's satisfaction: yes     Patient identity confirmed:  Verbally with patient and arm band Anesthesia:    Anesthesia method:  Topical application and local infiltration   Topical anesthetic:  LET   Local anesthetic:  Lidocaine 1% WITH epi Laceration details:    Location: Left thigh.   Length (cm):  5 Pre-procedure details:    Preparation:  Patient was prepped and draped in usual sterile fashion Exploration:    Hemostasis achieved with:  Direct pressure and LET   Wound extent: no foreign body, no signs of injury, no nerve damage and no tendon damage   Treatment:    Area cleansed with:  Saline   Amount of cleaning:  Standard   Irrigation solution:  Sterile saline   Irrigation volume:  100   Irrigation method:  Pressure wash Skin repair:    Repair method:  Sutures   Suture size:  5-0   Suture material:  Prolene   Suture technique:  Simple interrupted   Number of sutures:  5 Approximation:    Approximation:  Close Repair type:    Repair type:  Simple Post-procedure details:    Dressing:  Open (no dressing)   Procedure completion:   Tolerated well, no immediate complications     Medications Ordered in ED Medications  lidocaine-EPINEPHrine-tetracaine (LET) topical gel (has no administration in time range)    ED Course/ Medical Decision Making/ A&P                                 Medical Decision Making    Patient presents to the ED for concern of left thigh laceration, this involves an extensive number of treatment options, and is a complaint that carries with it a high risk of complications and morbidity.    Co morbidities that complicate  the patient evaluation  None   Additional history obtained:   External records from outside source obtained on chart review   Lab Tests and Imaging:  I do not feel that lab work or imaging is required for treatment or disposition plan.  There was no retained foreign body laceration was superficial.    Medicines ordered and prescription drug management:  I ordered medication including let, lidocaine for local pain management Reevaluation of the patient after these medicines showed that the patient improved I have reviewed the patients home medicines and have made adjustments as needed    Problem List / ED Course:  Laceration of left thigh, initial encounter   Reevaluation:  After the interventions noted above, I reevaluated the patient and found that they have :improved   Dispostion:  Upon evaluation, patient is resting comfortably in bed.  She is not ill-appearing nor any acute signs of distress.  Vital signs WNL.  See HPI.  Upon assessment, patient has 5 cm superficial well-approximated laceration to left thigh. No FB retained.  No warmth, erythema, swelling to laceration.  Pulse, motor, sensation intact and equal bilaterally to LEs.  No other injuries reported by patient or found during assessment.  Laceration repair with 5 Prolene procedure well-tolerated.  See procedure note.  After consideration of the diagnostic results and the patients response  to treatment, I feel that the patent would benefit from outpatient management.  Patient's last tetanus shot was in 2023. discussed patient can follow-up for suture removal in 7 to 10 days with primary care provider.  Discussed to not submerge area and keep wound clean.  Discussed return to emergency department precautions to include but not limited to erythema, warmth, purulent drainage, swelling around laceration, significant pain to area.  Patient reports understanding with findings and return precautions.  Dr. Hyacinth Meeker dependently assessed patient.  Treatment plan discussed with him and he agrees with plan.        Final Clinical Impression(s) / ED Diagnoses Final diagnoses:  Laceration of left thigh, initial encounter    Rx / DC Orders ED Discharge Orders     None         Judithann Sheen, PA 03/28/23 2212    Eber Hong, MD 03/29/23 1115

## 2023-03-28 NOTE — Discharge Instructions (Signed)
You for letting us evaluate you today.  Please remove sutures in 7 to 10 days by primary care, urgent care.  Do not submerge wound in soapy or dirty water and just let it run off wound in shower.  Return to emergency department if you experience warmth, erythema, swelling, significant pain, purulent pus

## 2023-06-05 ENCOUNTER — Encounter (HOSPITAL_COMMUNITY): Payer: Self-pay | Admitting: *Deleted

## 2023-06-05 ENCOUNTER — Inpatient Hospital Stay (HOSPITAL_COMMUNITY)
Admission: EM | Admit: 2023-06-05 | Discharge: 2023-06-08 | DRG: 315 | Disposition: A | Discharge status: Home / Self Care | Payer: Self-pay | Attending: Internal Medicine | Admitting: Internal Medicine

## 2023-06-05 ENCOUNTER — Other Ambulatory Visit: Payer: Self-pay

## 2023-06-05 ENCOUNTER — Emergency Department (HOSPITAL_COMMUNITY): Payer: Self-pay

## 2023-06-05 DIAGNOSIS — F32A Depression, unspecified: Secondary | ICD-10-CM | POA: Diagnosis present

## 2023-06-05 DIAGNOSIS — Z841 Family history of disorders of kidney and ureter: Secondary | ICD-10-CM

## 2023-06-05 DIAGNOSIS — I493 Ventricular premature depolarization: Secondary | ICD-10-CM | POA: Diagnosis present

## 2023-06-05 DIAGNOSIS — I309 Acute pericarditis, unspecified: Principal | ICD-10-CM

## 2023-06-05 DIAGNOSIS — Z8249 Family history of ischemic heart disease and other diseases of the circulatory system: Secondary | ICD-10-CM

## 2023-06-05 DIAGNOSIS — Z79899 Other long term (current) drug therapy: Secondary | ICD-10-CM

## 2023-06-05 DIAGNOSIS — R072 Precordial pain: Secondary | ICD-10-CM

## 2023-06-05 DIAGNOSIS — I2510 Atherosclerotic heart disease of native coronary artery without angina pectoris: Secondary | ICD-10-CM | POA: Diagnosis present

## 2023-06-05 DIAGNOSIS — Z881 Allergy status to other antibiotic agents status: Secondary | ICD-10-CM

## 2023-06-05 DIAGNOSIS — Z6841 Body Mass Index (BMI) 40.0 and over, adult: Secondary | ICD-10-CM

## 2023-06-05 DIAGNOSIS — Z880 Allergy status to penicillin: Secondary | ICD-10-CM

## 2023-06-05 DIAGNOSIS — I10 Essential (primary) hypertension: Secondary | ICD-10-CM | POA: Diagnosis present

## 2023-06-05 DIAGNOSIS — G629 Polyneuropathy, unspecified: Secondary | ICD-10-CM | POA: Diagnosis present

## 2023-06-05 DIAGNOSIS — I300 Acute nonspecific idiopathic pericarditis: Principal | ICD-10-CM | POA: Diagnosis present

## 2023-06-05 DIAGNOSIS — I319 Disease of pericardium, unspecified: Secondary | ICD-10-CM | POA: Diagnosis present

## 2023-06-05 DIAGNOSIS — I472 Ventricular tachycardia, unspecified: Secondary | ICD-10-CM | POA: Diagnosis not present

## 2023-06-05 DIAGNOSIS — E66813 Obesity, class 3: Secondary | ICD-10-CM

## 2023-06-05 DIAGNOSIS — K219 Gastro-esophageal reflux disease without esophagitis: Secondary | ICD-10-CM | POA: Diagnosis present

## 2023-06-05 DIAGNOSIS — Z635 Disruption of family by separation and divorce: Secondary | ICD-10-CM

## 2023-06-05 LAB — BASIC METABOLIC PANEL
Anion gap: 7 (ref 5–15)
BUN: 9 mg/dL (ref 6–20)
CO2: 22 mmol/L (ref 22–32)
Calcium: 8.7 mg/dL — ABNORMAL LOW (ref 8.9–10.3)
Chloride: 106 mmol/L (ref 98–111)
Creatinine, Ser: 0.74 mg/dL (ref 0.44–1.00)
GFR, Estimated: >60 mL/min (ref >60)
Glucose, Bld: 84 mg/dL (ref 70–99)
Potassium: 3.7 mmol/L (ref 3.5–5.1)
Sodium: 135 mmol/L (ref 135–145)

## 2023-06-05 LAB — CBC
HCT: 39.2 % (ref 36.0–46.0)
Hemoglobin: 12.4 g/dL (ref 12.0–15.0)
MCH: 25.7 pg — ABNORMAL LOW (ref 26.0–34.0)
MCHC: 31.6 g/dL (ref 30.0–36.0)
MCV: 81.3 fL (ref 80.0–100.0)
Platelets: 307 10*3/uL (ref 150–400)
RBC: 4.82 MIL/uL (ref 3.87–5.11)
RDW: 15.6 % — ABNORMAL HIGH (ref 11.5–15.5)
WBC: 8.6 10*3/uL (ref 4.0–10.5)
nRBC: 0 % (ref 0.0–0.2)

## 2023-06-05 LAB — TROPONIN I (HIGH SENSITIVITY)
Troponin I (High Sensitivity): 4 ng/L (ref <18)
Troponin I (High Sensitivity): 4 ng/L (ref <18)

## 2023-06-05 LAB — SARS CORONAVIRUS 2 BY RT PCR: SARS Coronavirus 2 by RT PCR: NEGATIVE

## 2023-06-05 LAB — SEDIMENTATION RATE: Sed Rate: 64 mm/h — ABNORMAL HIGH (ref 0–22)

## 2023-06-05 MED ORDER — HYDROCODONE-ACETAMINOPHEN 5-325 MG PO TABS
1.0000 | ORAL_TABLET | Freq: Once | ORAL | Status: AC
  Administered 2023-06-05: 1 via ORAL
  Filled 2023-06-05: qty 1

## 2023-06-05 MED ORDER — AMLODIPINE BESYLATE 5 MG PO TABS
5.0000 mg | ORAL_TABLET | Freq: Every day | ORAL | Status: DC
  Administered 2023-06-06 – 2023-06-08 (×3): 5 mg via ORAL
  Filled 2023-06-05 (×3): qty 1

## 2023-06-05 MED ORDER — VENLAFAXINE HCL ER 75 MG PO CP24: 150.0000 mg | ORAL_CAPSULE | Freq: Every day | ORAL | Status: DC

## 2023-06-05 MED ORDER — VENLAFAXINE HCL ER 75 MG PO CP24: 75.0000 mg | ORAL_CAPSULE | Freq: Every day | ORAL | Status: DC

## 2023-06-05 MED ORDER — IBUPROFEN 400 MG PO TABS
600.0000 mg | ORAL_TABLET | Freq: Three times a day (TID) | ORAL | Status: DC
  Administered 2023-06-06 – 2023-06-07 (×5): 600 mg via ORAL
  Filled 2023-06-05 (×5): qty 2

## 2023-06-05 MED ORDER — LOSARTAN POTASSIUM 50 MG PO TABS
100.0000 mg | ORAL_TABLET | ORAL | Status: DC
  Administered 2023-06-06 – 2023-06-08 (×3): 100 mg via ORAL
  Filled 2023-06-05 (×3): qty 2

## 2023-06-05 MED ORDER — METOPROLOL SUCCINATE ER 100 MG PO TB24
100.0000 mg | ORAL_TABLET | Freq: Every day | ORAL | Status: DC
  Administered 2023-06-06 – 2023-06-08 (×3): 100 mg via ORAL
  Filled 2023-06-05 (×3): qty 1

## 2023-06-05 MED ORDER — COLCHICINE 0.6 MG PO TABS
0.6000 mg | ORAL_TABLET | Freq: Two times a day (BID) | ORAL | Status: DC
  Administered 2023-06-06 – 2023-06-08 (×6): 0.6 mg via ORAL
  Filled 2023-06-05 (×6): qty 1

## 2023-06-05 MED ORDER — BUSPIRONE HCL 5 MG PO TABS
15.0000 mg | ORAL_TABLET | Freq: Two times a day (BID) | ORAL | Status: DC
Start: 2023-06-06 — End: 2023-06-08
  Administered 2023-06-06 – 2023-06-08 (×5): 15 mg via ORAL
  Filled 2023-06-05 (×5): qty 3

## 2023-06-05 MED ORDER — GABAPENTIN 300 MG PO CAPS
300.0000 mg | ORAL_CAPSULE | Freq: Every day | ORAL | Status: DC
  Administered 2023-06-06 – 2023-06-08 (×3): 300 mg via ORAL
  Filled 2023-06-05 (×3): qty 1

## 2023-06-05 MED ORDER — KETOROLAC TROMETHAMINE 15 MG/ML IJ SOLN
15.0000 mg | Freq: Once | INTRAMUSCULAR | Status: AC
  Administered 2023-06-05: 15 mg via INTRAVENOUS
  Filled 2023-06-05: qty 1

## 2023-06-05 MED ORDER — FAMOTIDINE 20 MG PO TABS
20.0000 mg | ORAL_TABLET | Freq: Every day | ORAL | Status: DC
  Administered 2023-06-06 – 2023-06-08 (×3): 20 mg via ORAL
  Filled 2023-06-05 (×3): qty 1

## 2023-06-05 MED ORDER — ENOXAPARIN SODIUM 60 MG/0.6ML IJ SOSY
60.0000 mg | PREFILLED_SYRINGE | INTRAMUSCULAR | Status: DC
  Administered 2023-06-06 – 2023-06-08 (×3): 60 mg via SUBCUTANEOUS
  Filled 2023-06-05 (×3): qty 0.6

## 2023-06-05 MED ORDER — GABAPENTIN 300 MG PO CAPS
600.0000 mg | ORAL_CAPSULE | Freq: Every day | ORAL | Status: DC
  Administered 2023-06-06 – 2023-06-07 (×3): 600 mg via ORAL
  Filled 2023-06-05 (×3): qty 2

## 2023-06-05 MED ORDER — LOSARTAN POTASSIUM-HCTZ 100-25 MG PO TABS: 1.0000 | ORAL_TABLET | ORAL | Status: DC

## 2023-06-05 MED ORDER — HYDROCHLOROTHIAZIDE 25 MG PO TABS
25.0000 mg | ORAL_TABLET | Freq: Every day | ORAL | Status: DC
  Administered 2023-06-06 – 2023-06-08 (×3): 25 mg via ORAL
  Filled 2023-06-05 (×3): qty 1

## 2023-06-05 NOTE — ED Provider Notes (Signed)
[place] EMERGENCY DEPARTMENT AT [facility] Provider Note   CSN: [id number] Arrival date & time: 06/05/23  1100     History  Chief Complaint  Patient presents with   Generalized Body Aches    Gwendolyn Edwards is a 47 y.o. female.  HPI     Home Medications Prior to Admission medications   Medication Sig Start Date End Date Taking? Authorizing Provider  amLODipine (NORVASC) 5 MG tablet Take 1 tablet by mouth daily 01/31/22     BIOTIN PO Take 1 tablet by mouth daily.    [provider]  busPIRone (BUSPAR) 10 MG tablet Take 1 tablet by mouth two times a day. 08/14/21     famotidine (PEPCID) 20 MG tablet Take 20 mg by mouth daily. 07/07/22   [provider]  furosemide (LASIX) 20 MG tablet Take 1 tablet (20 mg total) by mouth 2 (two) times daily for 2 days then take 1 tablet daily as needed Patient taking differently: Take 20 mg by mouth as needed. 03/06/22     gabapentin (NEURONTIN) 300 MG capsule 1 capsule (300 mg total) every morning AND 2 capsules (600 mg total) at bedtime. 04/17/22     losartan-hydrochlorothiazide (HYZAAR) 100-25 MG tablet Take 1 tablet by mouth every morning. 04/17/22     methocarbamol (ROBAXIN) 500 MG tablet Take 1 tablet (500 mg total) by mouth 2 (two) times daily. 10/09/22   Leath-Warren, Sadie Haber, NP  metoprolol succinate (TOPROL-XL) 100 MG 24 hr tablet Take 1 tablet (100 mg total) by mouth daily. Take with or immediately following a meal. 01/28/23 04/28/23  Jonelle Sidle, MD  Multiple Vitamin (MULTIVITAMIN PO) Take 1 tablet by mouth daily.    [provider]  ondansetron (ZOFRAN-ODT) 4 MG disintegrating tablet Take 1 tablet (4 mg total) by mouth every 8 (eight) hours as needed. 10/09/22   Leath-Warren, Sadie Haber, NP  rizatriptan (MAXALT-MLT) 10 MG disintegrating tablet Dissolve 1 tablet (10 mg total) by mouth as needed for migraine. May repeat in 2 hours if needed 04/24/21   Penumalli, Glenford Bayley, MD  topiramate  (TOPAMAX) 50 MG tablet Take 1 tablet (50 mg total) by mouth 2 (two) times daily. 04/24/21   Penumalli, Glenford Bayley, MD  venlafaxine XR (EFFEXOR-XR) 150 MG 24 hr capsule Take 1 capsule by mouth daily. 02/04/22     venlafaxine XR (EFFEXOR-XR) 75 MG 24 hr capsule Take 1 capsule by mouth daily (in addition to 150 mg dose). 07/12/21     vitamin B-12 (CYANOCOBALAMIN) 100 MCG tablet Take 100 mcg by mouth daily.    [provider]      Allergies    Penicillins, Doxycycline, and Augmentin [amoxicillin-pot clavulanate]    Review of Systems   Review of Systems  Physical Exam Updated Vital Signs BP (!) 141/72   Pulse 75   Temp 98.1 F (36.7 C) (Oral)   Resp 20   Ht 5\' 2"  (1.575 m)   Wt 117.9 kg   LMP 05/10/2023   SpO2 99%   BMI 47.55 kg/m  Physical Exam  ED Results / Procedures / Treatments   Labs (all labs ordered are listed, but only abnormal results are displayed) Labs Reviewed  BASIC METABOLIC PANEL - Abnormal; Notable for the following components:      Result Value   Calcium 8.7 (*)    All other components within normal limits  CBC - Abnormal; Notable for the following components:   MCH 25.7 (*)    RDW  15.6 (*)    All other components within normal limits  SEDIMENTATION RATE - Abnormal; Notable for the following components:   Sed Rate 64 (*)    All other components within normal limits  SARS CORONAVIRUS 2 BY RT PCR  C-REACTIVE PROTEIN  TROPONIN I (HIGH SENSITIVITY)  TROPONIN I (HIGH SENSITIVITY)    EKG EKG Interpretation Date/Time:  Friday June 05 2023 13:23:46 EST Ventricular Rate:  70 PR Interval:  149 QRS Duration:  90 QT Interval:  394 QTC Calculation: 426 R Axis:   67  Text Interpretation: Sinus rhythm Abnrm T, consider ischemia, anterolateral lds New since previous tracing Confirmed by Vanetta Mulders [phone]) on 06/05/2023 4:19:03 PM  Radiology DG Chest Port 1 View Result Date: 06/05/2023 CLINICAL DATA:  Generalized body aches with back pain and  fevers EXAM: PORTABLE CHEST 1 VIEW COMPARISON:  Chest radiograph dated 11/16/2019 FINDINGS: Normal lung volumes. Unchanged linear density across the left mid lung, likely scarring. No pleural effusion or pneumothorax. The heart size and mediastinal contours are within normal limits. No acute osseous abnormality. IMPRESSION: No active disease. Electronically Signed   By: Agustin Cree M.D.   On: 06/05/2023 14:54    Procedures Procedures    Medications Ordered in ED Medications  ketorolac (TORADOL) 15 MG/ML injection 15 mg (15 mg Intravenous Given 06/05/23 1343)  HYDROcodone-acetaminophen (NORCO/VICODIN) 5-325 MG per tablet 1 tablet (1 tablet Oral Given 06/05/23 1650)    ED Course/ Medical Decision Making/ A&P Clinical Course as of 06/05/23 1933  Fri Jun 05, 2023  1628 Patient having a cold for the past week and a half now having subjective fevers and sweats at night and today having pain in her chest and bilateral shoulders that feels like prior pericarditis.  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  Follows locally with Dr. Diona Browner.  She was given Toradol, troponins are baseline, no ST elevation or PR depression she does have some anterior lateral T wave inversions.  I have consulted cardiology at this time.  [CB]    Clinical Course User Index [CB] Ma Rings, PA-C                                 Medical Decision Making This patient presents to the ED for concern of URI symptoms and developing chest pain that feels like prior pericarditis , this involves an extensive number of treatment options, and is a complaint that carries with it a high risk of complications and morbidity.  The differential diagnosis includes [place], ACS, pericarditis, PE, other   Co morbidities that complicate the patient evaluation :   Idiopathic pericarditis   Additional history obtained:  Additional  history obtained from EMR External records from outside source obtained and reviewed including prior notes including cardiology notes, prior labs   Lab Tests:  I Ordered, and personally interpreted labs.  The pertinent results include: Troponin negative x 2, BMP normal, CBC reassuring, COVID-negative but sed rate elevated at 64, CRP ordered and pending.   Imaging Studies ordered:  I ordered imaging studies including x-ray which shows no pulm edema, no infiltrate, no pneumothorax I independently visualized and interpreted imaging within scope of identifying emergent findings  I agree with the radiologist interpretation   Cardiac Monitoring: / EKG:  The patient was maintained on a cardiac monitor.  I  personally viewed and interpreted the cardiac monitored which showed an underlying rhythm of: Sinus rhythm with anterolateral T wave inversions, no PR depression no ST elevation   Consultations Obtained:  I requested consultation with the cardiologist Dr. Carolan Clines,  and discussed lab and imaging findings as well as pertinent plan - they recommend: [place] for pericarditis, start colchicine   Problem List / ED Course / Critical interventions / Medication management  Likely pericarditis-patient history of pericarditis is usually triggered with viral illness which she started having symptoms of about a week and a half ago.  Started having some chest pain today that is exactly prior pericarditis that goes into her shoulders and feels like pressure.  No STEMI, troponins are negative, in the past has had cardiac workup and felt to be due to the pericarditis.  She was given Toradol here without relief and then subsequently some Norco and started on colchicine after consultation with cardiology who wants to patient to [facility]  I have reviewed the patients home medicines and have made adjustments as needed       Amount and/or Complexity of Data Reviewed Labs: ordered. Radiology:  ordered.  Risk Prescription drug management. Decision regarding hospitalization.           Final Clinical Impression(s) / ED Diagnoses Final diagnoses:  Acute pericarditis, unspecified type    Rx / DC Orders ED Discharge Orders     None         Ma Rings, PA-C 06/05/23 1933    Vanetta Mulders, MD 06/08/23 1114

## 2023-06-05 NOTE — ED Triage Notes (Signed)
Pt c/o generalized body aches with back pain and fevers

## 2023-06-06 ENCOUNTER — Inpatient Hospital Stay (HOSPITAL_COMMUNITY): Payer: Self-pay

## 2023-06-06 DIAGNOSIS — R079 Chest pain, unspecified: Secondary | ICD-10-CM

## 2023-06-06 DIAGNOSIS — I300 Acute nonspecific idiopathic pericarditis: Secondary | ICD-10-CM

## 2023-06-06 LAB — COMPREHENSIVE METABOLIC PANEL
ALT: 13 U/L (ref 0–44)
AST: 12 U/L — ABNORMAL LOW (ref 15–41)
Albumin: 2.8 g/dL — ABNORMAL LOW (ref 3.5–5.0)
Alkaline Phosphatase: 71 U/L (ref 38–126)
Anion gap: 4 — ABNORMAL LOW (ref 5–15)
BUN: 9 mg/dL (ref 6–20)
CO2: 20 mmol/L — ABNORMAL LOW (ref 22–32)
Calcium: 7.9 mg/dL — ABNORMAL LOW (ref 8.9–10.3)
Chloride: 111 mmol/L (ref 98–111)
Creatinine, Ser: 0.7 mg/dL (ref 0.44–1.00)
GFR, Estimated: >60 mL/min (ref >60)
Glucose, Bld: 119 mg/dL — ABNORMAL HIGH (ref 70–99)
Potassium: 3.5 mmol/L (ref 3.5–5.1)
Sodium: 135 mmol/L (ref 135–145)
Total Bilirubin: 0.5 mg/dL (ref <1.2)
Total Protein: 6.3 g/dL — ABNORMAL LOW (ref 6.5–8.1)

## 2023-06-06 LAB — ECHOCARDIOGRAM COMPLETE
Area-P 1/2: 4.33 cm2
Height: 62 in
S' Lateral: 3.1 cm
Weight: 4476.8 [oz_av]

## 2023-06-06 LAB — CBC
HCT: 36.4 % (ref 36.0–46.0)
Hemoglobin: 11.4 g/dL — ABNORMAL LOW (ref 12.0–15.0)
MCH: 25.3 pg — ABNORMAL LOW (ref 26.0–34.0)
MCHC: 31.3 g/dL (ref 30.0–36.0)
MCV: 80.7 fL (ref 80.0–100.0)
Platelets: 290 10*3/uL (ref 150–400)
RBC: 4.51 MIL/uL (ref 3.87–5.11)
RDW: 15.8 % — ABNORMAL HIGH (ref 11.5–15.5)
WBC: 8.3 10*3/uL (ref 4.0–10.5)
nRBC: 0 % (ref 0.0–0.2)

## 2023-06-06 LAB — C-REACTIVE PROTEIN: CRP: <0.5 mg/dL (ref <1.0)

## 2023-06-06 MED ORDER — MELATONIN 5 MG PO TABS
10.0000 mg | ORAL_TABLET | Freq: Every day | ORAL | Status: DC
  Administered 2023-06-06 – 2023-06-07 (×3): 10 mg via ORAL
  Filled 2023-06-06 (×3): qty 2

## 2023-06-06 MED ORDER — TOPIRAMATE 25 MG PO TABS
25.0000 mg | ORAL_TABLET | Freq: Two times a day (BID) | ORAL | Status: DC
  Administered 2023-06-06 – 2023-06-08 (×5): 25 mg via ORAL
  Filled 2023-06-06 (×6): qty 1

## 2023-06-06 MED ORDER — VENLAFAXINE HCL ER 75 MG PO CP24
225.0000 mg | ORAL_CAPSULE | Freq: Every day | ORAL | Status: DC
  Administered 2023-06-06 – 2023-06-08 (×3): 225 mg via ORAL
  Filled 2023-06-06 (×3): qty 3

## 2023-06-06 NOTE — H&P (Signed)
Cardiology Admission History and Physical   Patient ID: JARIA HAPP MRN: [id number]; DOB: [birth year]   Admission date: 06/05/2023  PCP:  Gwendolyn Specking, MD   [facility] Providers Cardiologist:  Nona Dell, MD        Chief Complaint:  chest pain  Patient Profile:   Gwendolyn Edwards is a 47 y.o. female with hypertension, recurrent pericarditis, GERD who is being seen 06/06/2023 for the evaluation of chest pain.  History of Present Illness:   Ms. Gwendolyn Edwards recently recovered from a viral illness.  Noted to have upper respiratory symptoms that lasted for several days.  Subsequently after that she developed chest tightness and pain that radiated to both shoulders.  Notes that she has had multiple episodes of pericarditis that have all happen after viral illnesses.  This is the exact feeling of those.  She also notes that at some point she told maybe she had an autoimmune condition given her recurrent pericarditis, however notes that workup has always been unremarkable.  On arrival she is hemodynamically stable and labs are largely unremarkable including negative troponin.  She was admitted to the hospital for persistent pain.   Past Medical History:  Diagnosis Date   Abscess of neck 2019   Acid reflux    Anxiety    Essential hypertension    GERD (gastroesophageal reflux disease)    Hemorrhoids    Hypertension    IBS (irritable bowel syndrome)    OA (osteoarthritis) of knee    Pericarditis    Possible pericarditis/myocarditis December 2020   Shingles 07/2020   recurrent   Snoring     Past Surgical History:  Procedure Laterality Date   LEFT HEART CATH AND CORONARY ANGIOGRAPHY N/A 06/05/2019   Procedure: LEFT HEART CATH AND CORONARY ANGIOGRAPHY;  Surgeon: Tonny Bollman, MD;  Location: [facility] INVASIVE CV LAB;  Service: Cardiovascular;  Laterality: N/A;   WISDOM TOOTH EXTRACTION       Medications Prior to Admission: Prior to Admission  medications   Medication Sig Start Date End Date Taking? Authorizing Provider  amLODipine (NORVASC) 5 MG tablet Take 1 tablet by mouth daily 01/31/22     BIOTIN PO Take 1 tablet by mouth daily.    [provider]  busPIRone (BUSPAR) 10 MG tablet Take 1 tablet by mouth two times a day. 08/14/21     famotidine (PEPCID) 20 MG tablet Take 20 mg by mouth daily. 07/07/22   [provider]  furosemide (LASIX) 20 MG tablet Take 1 tablet (20 mg total) by mouth 2 (two) times daily for 2 days then take 1 tablet daily as needed Patient taking differently: Take 20 mg by mouth as needed. 03/06/22     gabapentin (NEURONTIN) 300 MG capsule 1 capsule (300 mg total) every morning AND 2 capsules (600 mg total) at bedtime. 04/17/22     losartan-hydrochlorothiazide (HYZAAR) 100-25 MG tablet Take 1 tablet by mouth every morning. 04/17/22     methocarbamol (ROBAXIN) 500 MG tablet Take 1 tablet (500 mg total) by mouth 2 (two) times daily. 10/09/22   Leath-Warren, Sadie Haber, NP  metoprolol succinate (TOPROL-XL) 100 MG 24 hr tablet Take 1 tablet (100 mg total) by mouth daily. Take with or immediately following a meal. 01/28/23 04/28/23  Jonelle Sidle, MD  Multiple Vitamin (MULTIVITAMIN PO) Take 1 tablet by mouth daily.    [provider]  ondansetron (ZOFRAN-ODT) 4 MG disintegrating tablet Take 1 tablet (4 mg total) by mouth every 8 (eight)  hours as needed. 10/09/22   Leath-Warren, Sadie Haber, NP  rizatriptan (MAXALT-MLT) 10 MG disintegrating tablet Dissolve 1 tablet (10 mg total) by mouth as needed for migraine. May repeat in 2 hours if needed 04/24/21   Penumalli, Glenford Bayley, MD  topiramate (TOPAMAX) 50 MG tablet Take 1 tablet (50 mg total) by mouth 2 (two) times daily. 04/24/21   Penumalli, Glenford Bayley, MD  venlafaxine XR (EFFEXOR-XR) 150 MG 24 hr capsule Take 1 capsule by mouth daily. 02/04/22     venlafaxine XR (EFFEXOR-XR) 75 MG 24 hr capsule Take 1 capsule by mouth daily (in addition to 150 mg dose).  07/12/21     vitamin B-12 (CYANOCOBALAMIN) 100 MCG tablet Take 100 mcg by mouth daily.    [provider]     Allergies:    Allergies  Allergen Reactions   Penicillins Swelling   Doxycycline     Hives and itching    Augmentin [Amoxicillin-Pot Clavulanate] Swelling and Rash    Tongue swells     Social History:   Social History   Socioeconomic History   Marital status: Legally Separated    Spouse name: Not on file   Number of children: 2   Years of education: Not on file   Highest education level: Not on file  Occupational History    Comment: [employer]  Tobacco Use   Smoking status: Never   Smokeless tobacco: Never  Vaping Use   Vaping status: Never Used  Substance and Sexual Activity   Alcohol use: No   Drug use: No   Sexual activity: Yes    Birth control/protection: None  Other Topics Concern   Not on file  Social History Narrative   Lives with family   Social Drivers of Health   Financial Resource Strain: Low Risk  (12/17/2020)   Overall Financial Resource Strain (CARDIA)    Difficulty of Paying Living Expenses: Not hard at all  Food Insecurity: Low Risk  (07/07/2022)   [facility]   Hunger Vital Sign    Worried About Running Out of Food in the Last Year: Never true    Within the past 12 months, the food you bought just didn't last and you didn't have money to get more: Not on file  Transportation Needs: No Transportation Needs (07/07/2022)   [facility]   Transportation    In the past 12 months, has lack of reliable transportation kept you from medical appointments, meetings, work or from getting things needed for daily living? : No  Physical Activity: Insufficiently Active (12/17/2020)   Exercise Vital Sign    Days of Exercise per Week: 1 day    Minutes of Exercise per Session: 60 min  Stress: No Stress Concern Present (12/17/2020)   [employer] of Occupational Health -  Occupational Stress Questionnaire    Feeling of Stress : Not at all  Social Connections: Unknown (10/13/2022)   Received from [facility]   Social Network    Social Network: Not on file  Intimate Partner Violence: Not At Risk (10/13/2022)   Received from [facility]   HITS    Over the last 12 months how often did your partner physically hurt you?: Never    Over the last 12 months how often did your partner insult you or talk down to you?: Never    Over the last 12 months how often did your partner threaten you with physical harm?: Never  Over the last 12 months how often did your partner scream or curse at you?: Never    Family History:   The patient's family history includes Cancer in her father; Hepatitis C in her mother; Hypertension in her mother; Kidney disease in her mother; Thyroid disease in her sister.    ROS:  Please see the history of present illness.  All other ROS reviewed and negative.     Physical Exam/Data:   Vitals:   06/05/23 2136 06/05/23 2348 06/06/23 0000 06/06/23 0100  BP: 133/87 136/62    Pulse: 76 78    Resp: 18 17 20  (!) 21  Temp: 98.2 F (36.8 C) 98.6 F (37 C)    TempSrc: Oral Oral    SpO2: 99% 100%    Weight: 126.9 kg     Height:       No intake or output data in the 24 hours ending 06/06/23 0641    06/05/2023    9:36 PM 06/05/2023   11:17 AM 03/28/2023    8:11 PM  Last 3 Weights  Weight (lbs) 279 lb 12.8 oz 260 lb 266 lb 15.6 oz  Weight (kg) 126.916 kg 117.935 kg 121.1 kg     Body mass index is 51.18 kg/m.  General:  Well nourished, well developed, in no acute distress HEENT: normal Neck: no JVD Vascular: No carotid bruits; Distal pulses 2+ bilaterally   Cardiac:  normal S1, S2; RRR; no murmur  Lungs:  clear to auscultation bilaterally, no wheezing, rhonchi or rales  Abd: soft, nontender, no hepatomegaly  Ext: no edema Musculoskeletal:  No deformities, BUE and BLE strength normal and equal Skin: warm and dry  Neuro:  CNs 2-12  intact, no focal abnormalities noted Psych:  Normal affect    EKG:  The ECG that was done  was personally reviewed and demonstrates no major ST or T wave changes.  No clear signs of pericarditis on ECG.  Relevant CV Studies: None  Laboratory Data:  High Sensitivity Troponin:   Recent Labs  Lab 06/05/23 1342 06/05/23 1519  TROPONINIHS 4 4      Chemistry Recent Labs  Lab 06/05/23 1342 06/06/23 0243  NA 135 135  K 3.7 3.5  CL 106 111  CO2 22 20*  GLUCOSE 84 119*  BUN 9 9  CREATININE 0.74 0.70  CALCIUM 8.7* 7.9*  GFRNONAA >60 >60  ANIONGAP 7 4*    Recent Labs  Lab 06/06/23 0243  PROT 6.3*  ALBUMIN 2.8*  AST 12*  ALT 13  ALKPHOS 71  BILITOT 0.5   Lipids No results for input(s): "CHOL", "TRIG", "HDL", "LABVLDL", "LDLCALC", "CHOLHDL" in the last 168 hours. Hematology Recent Labs  Lab 06/05/23 1342 06/06/23 0243  WBC 8.6 8.3  RBC 4.82 4.51  HGB 12.4 11.4*  HCT 39.2 36.4  MCV 81.3 80.7  MCH 25.7* 25.3*  MCHC 31.6 31.3  RDW 15.6* 15.8*  PLT 307 290   Thyroid No results for input(s): "TSH", "FREET4" in the last 168 hours. BNPNo results for input(s): "BNP", "PROBNP" in the last 168 hours.  DDimer No results for input(s): "DDIMER" in the last 168 hours.   Radiology/Studies:  DG Chest Port 1 View Result Date: 06/05/2023 CLINICAL DATA:  Generalized body aches with back pain and fevers EXAM: PORTABLE CHEST 1 VIEW COMPARISON:  Chest radiograph dated 11/16/2019 FINDINGS: Normal lung volumes. Unchanged linear density across the left mid lung, likely scarring. No pleural effusion or pneumothorax. The heart size and mediastinal contours are within  normal limits. No acute osseous abnormality. IMPRESSION: No active disease. Electronically Signed   By: Agustin Cree M.D.   On: 06/05/2023 14:54     Assessment and Plan:   Pericarditis, recurrent.  She has had several episodes with classic symptoms and all have been postviral.  Notes that she just recovered from a viral  illness about a week ago.  Feels this is the exact same as all of her other episodes of pericarditis.  Pain improved some with nonsteroidal anti-inflammatories.  Notes that most recently her episode was in January and she was treated with ibuprofen and colchicine. -Start colchicine 0.6 mg twice per day and ibuprofen 600 mg 3 times a day -Repeat echo to ensure normal cardiac function and no effusion  Hypertension.  Continue home amlodipine, losartan, hydrochlorothiazide, and metoprolol. Depression.  Continue home buspirone, Effexor. GERD.  Continue home famotidine. Peripheral neuropathy.  Continue gabapentin.   Risk Assessment/Risk Scores:     Code Status: Full Code  Severity of Illness: The appropriate patient status for this patient is INPATIENT. Inpatient status is judged to be reasonable and necessary in order to provide the required intensity of service to ensure the patient's safety. The patient's presenting symptoms, physical exam findings, and initial radiographic and laboratory data in the context of their chronic comorbidities is felt to place them at high risk for further clinical deterioration. Furthermore, it is not anticipated that the patient will be medically stable for discharge from the hospital within 2 midnights of admission.   * I certify that at the point of admission it is my clinical judgment that the patient will require inpatient hospital care spanning beyond 2 midnights from the point of admission due to high intensity of service, high risk for further deterioration and high frequency of surveillance required.*   For questions or updates, please contact [facility] Please consult [email or web address] for contact info under     Signed, Joellen Jersey, MD  06/06/2023 6:41 AM

## 2023-06-06 NOTE — Progress Notes (Signed)
  Echocardiogram 2D Echocardiogram has been performed.  Leda Roys RDCS 06/06/2023, 4:19 PM

## 2023-06-06 NOTE — Progress Notes (Signed)
Patient was admitted past midnight please see H&P for complete documentation.  Patient seen during rounds.  Accompanied by her fianc and daughter at bedside.  Patient states that she had anterior precordial discomfort, associated with shoulder and neck pain which is very common after she overcomes a viral infection.  She had a URI symptoms that were ongoing which resolved earlier this past week followed by precordial discomfort.  The discomfort is anteriorly located, better with sitting upright, worse with laying flat, nonpleuritic, nonexertional.  She has been treated for pericarditis in the past and feels the discomfort is the same.  EKG shows a sinus rhythm with TWI in the anterolateral leads similar findings noted on tracing from 2020.  ESR is elevated at 64, CRP within normal limits. High sensitive troponins negative x 2.  Initial plan was to repeat echocardiogram-was not ordered.  Will check a limited echo to reevaluate LVEF and regional wall motion normalities to make sure there is no pericardial effusion as well.  If clinically she continues to improve on medical therapy likely discharge tomorrow.  Patient is agreeable with the plan of care.  No charge.  Mahrukh Seguin [place], DO, [facility]

## 2023-06-06 NOTE — Progress Notes (Signed)
Patient admitted from [facility] and oriented.Vital signs checked ,attached to continous cardiac monitoring,CCMD notified. Admitting provider notified.

## 2023-06-07 DIAGNOSIS — E66813 Obesity, class 3: Secondary | ICD-10-CM

## 2023-06-07 DIAGNOSIS — I10 Essential (primary) hypertension: Secondary | ICD-10-CM

## 2023-06-07 DIAGNOSIS — Z6841 Body Mass Index (BMI) 40.0 and over, adult: Secondary | ICD-10-CM

## 2023-06-07 DIAGNOSIS — R072 Precordial pain: Secondary | ICD-10-CM

## 2023-06-07 MED ORDER — IBUPROFEN 400 MG PO TABS
800.0000 mg | ORAL_TABLET | Freq: Three times a day (TID) | ORAL | Status: DC
  Administered 2023-06-07 – 2023-06-08 (×3): 800 mg via ORAL
  Filled 2023-06-07 (×3): qty 2

## 2023-06-07 MED ORDER — IBUPROFEN 400 MG PO TABS
200.0000 mg | ORAL_TABLET | ORAL | Status: AC
  Administered 2023-06-07: 200 mg via ORAL
  Filled 2023-06-07: qty 1

## 2023-06-07 NOTE — Discharge Summary (Incomplete)
Discharge Summary    Patient ID: Gwendolyn Edwards MRN: [id number]; DOB: 09/22/1975  Admit date: 06/05/2023 Discharge date: 06/07/2023  PCP:  Ignatius Specking, MD   [facility] Providers Cardiologist:  Nona Dell, MD   { Click here to update MD or APP on Care Team, Refresh:1}     Discharge Diagnoses    Principal Problem:   Pericarditis, acute, idiopathic Active Problems:   Essential hypertension  Diagnostic Studies/Procedures    ECHO COMPLETE WO IMAGING ENHANCING AGENT 06/06/2023 IMPRESSIONS 1. Left ventricular ejection fraction, by estimation, is 55 to 60%. The left ventricle has normal function. The left ventricle has no regional wall motion abnormalities. Left ventricular diastolic parameters were normal. 2. Right ventricular systolic function is normal. The right ventricular size is normal. 3. The mitral valve is normal in structure. No evidence of mitral valve regurgitation. No evidence of mitral stenosis. 4. The aortic valve is tricuspid. Aortic valve regurgitation is not visualized. No aortic stenosis is present. 5. The inferior vena cava is dilated in size with <50% respiratory variability, suggesting right atrial pressure of 15 mmHg.  Pericardium: There is no evidence of pericardial effusion. _____________   History of Present Illness     Gwendolyn Edwards is a 47 y.o. female with hypertension, minimal coronary atherosclerosis on cath in 2020, recurrent pericarditis, GERD who was admitted for evaluation of chest pain c/w prior episodes of pericarditis.   Hospital Course     Consultants: ***   She was admitted and started on Colchicine and Ibuprofen. Echocardiogram showed normal EF and no pericardial effusion. Her hsTroponins were negative. ESR was elevated but hs-CRP was normal. ***     Did the patient have an acute coronary syndrome (MI, NSTEMI, STEMI, etc) this admission?:  No                               Did the patient have a  percutaneous coronary intervention (stent / angioplasty)?:  No.    {Does this patient need a TOC appointment?:[id number]} _____________  Discharge Vitals Blood pressure (!) 115/51, pulse 68, temperature 98.2 F (36.8 C), temperature source Oral, resp. rate 15, height 5\' 2"  (1.575 m), weight 126.9 kg, last menstrual period 05/10/2023, SpO2 100%.  Filed Weights   06/05/23 1117 06/05/23 2136  Weight: 117.9 kg 126.9 kg   Labs & Radiologic Studies    CBC Recent Labs    06/05/23 1342 06/06/23 0243  WBC 8.6 8.3  HGB 12.4 11.4*  HCT 39.2 36.4  MCV 81.3 80.7  PLT 307 290   Basic Metabolic Panel Recent Labs    78/29/56 1342 06/06/23 0243  NA 135 135  K 3.7 3.5  CL 106 111  CO2 22 20*  GLUCOSE 84 119*  BUN 9 9  CREATININE 0.74 0.70  CALCIUM 8.7* 7.9*   Liver Function Tests Recent Labs    06/06/23 0243  AST 12*  ALT 13  ALKPHOS 71  BILITOT 0.5  PROT 6.3*  ALBUMIN 2.8*   High Sensitivity Troponin:   Recent Labs  Lab 06/05/23 1342 06/05/23 1519  TROPONINIHS 4 4    Sed Rate  Date Value Ref Range Status  06/05/2023 64 (H) 0 - 22 mm/hr Final    Comment:    Performed at [facility], [address], [place], [place] [address]   CRP  Date Value Ref Range Status  06/05/2023 <0.5 <1.0 mg/dL Final    Comment:  Performed at [facility], [address], [place], [place] [address]    _____________  DG Chest Port 1 View Result Date: 06/05/2023 CLINICAL DATA:  Generalized body aches with back pain and fevers  EXAM: PORTABLE CHEST 1 VIEW COMPARISON:  Chest radiograph dated 11/16/2019 FINDINGS: Normal lung volumes. Unchanged linear density across the left mid lung, likely scarring. No pleural effusion or pneumothorax. The heart size and mediastinal contours are within normal limits. No acute osseous abnormality.  IMPRESSION: No active disease.  Electronically Signed   By: Agustin Cree M.D.   On: 06/05/2023 14:54   Disposition   Pt is being discharged home today in  good condition.  Follow-up Plans & Appointments        Discharge Medications   Allergies as of 06/07/2023       Reactions   Penicillins Swelling   Doxycycline    Hives and itching   Augmentin [amoxicillin-pot Clavulanate] Swelling, Rash   Tongue swells      Med Rec must be completed prior to using this SMARTLINK***          Outstanding Labs/Studies   ***  Duration of Discharge Encounter   Greater than 30 minutes including physician time.  Signed, Tereso Newcomer, PA-C 06/07/2023, 7:53 AM

## 2023-06-07 NOTE — Progress Notes (Signed)
Per Dr. Odis Hollingshead, due to ongoing pain, will plan for CT coronary tomorrow.

## 2023-06-07 NOTE — Progress Notes (Addendum)
Patient Name: Gwendolyn Edwards Date of Encounter: 06/07/2023 [facility] Cardiologist: Nona Dell, MD    Interval Summary  .    She has had sharp pain radiating up from her chest to her neck this morning since 5AM. The chest pressure is improved somewhat. Her neck pain is most bothersome for her this morning. She feels better sitting up.  Vital Signs .    Vitals:   06/06/23 2340 06/07/23 0510 06/07/23 0759 06/07/23 0805  BP: (!) 112/31 (!) 115/51 (!) 140/72 (!) 140/72  Pulse: 76 68 70 81  Resp: 17 15 15    Temp: 98.4 F (36.9 C) 98.2 F (36.8 C) 98.5 F (36.9 C)   TempSrc: Oral Oral Oral   SpO2: 99% 100% 100%   Weight:      Height:       No intake or output data in the 24 hours ending 06/07/23 0851    06/05/2023    9:36 PM 06/05/2023   11:17 AM 03/28/2023    8:11 PM  Last 3 Weights  Weight (lbs) 279 lb 12.8 oz 260 lb 266 lb 15.6 oz  Weight (kg) 126.916 kg 117.935 kg 121.1 kg      Telemetry/ECG    Tele: NSR - Personally Reviewed  EKG: NSR, non-specific ST-TW changes - Personally reviewed   Physical Exam .   GEN: No acute distress.   Neck: No tenderness to palpation  Cardiac: RRR, 2/6 systolic murmur RUSB, no rubs  Respiratory: Clear to auscultation bilaterally. MS: No edema  Assessment & Plan .     47 y.o. female w hx of hypertension, minimal coronary atherosclerosis on cath in 2020, recurrent pericarditis, GERD who was admitted for evaluation of chest pain c/w prior episodes of pericarditis. She was admitted and started on Colchicine and Ibuprofen. ESR is elevated, hs-CRP is normal, hsTrops are normal. Echocardiogram showed normal EF and no pericardial effusion. Substernal chest pain is improving but today she is having more severe pain radiating to her neck.   1. Recurrent Pericarditis Symptoms not well controlled. She just had Ibuprofen about 20 mins ago. Will increase dose to max and she how she responds.  - Continue Colchicine 0.6 mg  twice daily - will need to remain on for at least 6 mos. - Increase Ibuprofen 800 mg three times - If no good response, consider changing to Indomethacin or high dose ASA - Will need to make sure pain is reasonably controlled prior to DC  2. Hypertension  BP controlled. Continue Amlodipine 5 mg once daily, Losartan 100 mg once daily, hydrochlorothiazide 25 mg once daily, Metoprolol succinate 100 mg once daily.    For questions or updates, please contact [facility] Please consult [email or web address] for contact info under    Signed, Tereso Newcomer, PA-C    ADDENDUM:   Patient seen and examined with Tereso Newcomer, PA-C .  I personally taken a history, examined the patient, reviewed relevant notes,  laboratory data / imaging studies.  I performed a substantive portion of this encounter and formulated the important aspects of the plan.  I agree with the APP's note, impression, and recommendations; however, I have edited the note to reflect changes or salient points.  Seen and examined at bedside at approximately 12:30 PM. No family at bedside. Still continues to have chest tightness though improving.   When she has these episodes of the pain radiates to the neck  PHYSICAL EXAM: Today's Vitals   06/07/23 0510 06/07/23 0759 06/07/23 0805  06/07/23 1212  BP: (!) 115/51 (!) 140/72 (!) 140/72 111/72  Pulse: 68 70 81 63  Resp: 15 15  20   Temp: 98.2 F (36.8 C) 98.5 F (36.9 C)  98.3 F (36.8 C)  TempSrc: Oral Oral  Oral  SpO2: 100% 100%  99%  Weight:      Height:      PainSc:   6     Body mass index is 51.18 kg/m.   Net IO Since Admission: No IO data has been entered for this period [06/07/23 1230]  Filed Weights   06/05/23 1117 06/05/23 2136  Weight: 117.9 kg 126.9 kg    Physical Exam  Constitutional:  Age appropriate, hemodynamically stable, no acute distress.   Neck: No JVD present.  Cardiovascular: Normal rate, regular rhythm, S1 normal and S2 normal. Exam reveals  no gallop and no friction rub.  No murmur heard. Pulmonary/Chest: Breath sounds normal. She has no wheezes. She has no rales. She exhibits no tenderness.  Abdominal: Soft. Bowel sounds are normal. She exhibits no distension. There is no abdominal tenderness.  Musculoskeletal:        General: No tenderness or edema.  Neurological: She is alert and oriented to person, place, and time.  Skin: Skin is warm and dry.   EKG: (personally reviewed by me) No new EKGs  Telemetry: (personally reviewed by me) Sinus rhythm with 1 episode of NSVT   Impression:  Precordial pain History of recurrent idiopathic pericarditis. Hypertension. Premature ventricular contractions  Recommendations:  Patient presented to the hospital with a chief complaint of precordial pain.  Her symptoms are not classic of acute pericarditis.  But patient states that her current symptoms are very similar to her prior pericarditis presentation.  As a result, she is been started on NSAIDs and colchicine which have provided very minimal improvement in her discomfort.  High sensitive troponins have been negative.  EKG has noted sinus rhythm with TWI in anterolateral leads concerning for possible ischemia (similar findings on prior tracings).   Initial plan was to discharge her if her pain has improved and echocardiogram was reassuring.  However earlier this morning at 5 AM she woke up suddenly with substernal discomfort radiating to the neck and since then has been very concerned for heart disease.  I provided her reassurance that her symptoms are not consistent with ACS.  She had a heart catheterization in 2020 with Dr. Excell Seltzer and was noted to have very minimal nonobstructive disease.  Since then no other ischemic workup has been performed.  On telemetry she also had 1 episode of NSVT.  She has known history of PVCs/palpitations and primary cardiologist as outpatient as placed on Toprol-XL.  We spoke about discharge today with  close follow-up with outpatient cardiology versus ischemic workup inpatient.  Patient prefers the latter.  We discussed the role of stress testing and coronary CTA and the shared decision was to proceed with the latter after discussing risks, benefits, alternatives, and limitations.  Further recommendations to follow as the case evolves.   This note was created using a voice recognition software as a result there may be grammatical errors inadvertently enclosed that do not reflect the nature of this encounter. Every attempt is made to correct such errors.   Tessa Lerner, DO, [facility]  [address] [place], [place] [address] Pager: [phone] Office: [phone] 06/07/2023 12:30 PM

## 2023-06-08 ENCOUNTER — Inpatient Hospital Stay (HOSPITAL_COMMUNITY): Payer: Self-pay

## 2023-06-08 DIAGNOSIS — I300 Acute nonspecific idiopathic pericarditis: Secondary | ICD-10-CM

## 2023-06-08 MED ORDER — IBUPROFEN 800 MG PO TABS: ORAL_TABLET | ORAL | 0 refills | Status: AC

## 2023-06-08 MED ORDER — IBUPROFEN 400 MG PO TABS
800.0000 mg | ORAL_TABLET | Freq: Three times a day (TID) | ORAL | Status: DC
Start: 2023-06-08 — End: 2023-06-08

## 2023-06-08 MED ORDER — NITROGLYCERIN 0.4 MG SL SUBL
SUBLINGUAL_TABLET | SUBLINGUAL | Status: AC
  Filled 2023-06-08: qty 2

## 2023-06-08 MED ORDER — IOHEXOL 350 MG/ML SOLN
95.0000 mL | Freq: Once | INTRAVENOUS | Status: AC | PRN
  Administered 2023-06-08: 95 mL via INTRAVENOUS

## 2023-06-08 MED ORDER — COLCHICINE 0.6 MG PO TABS: 0.6000 mg | ORAL_TABLET | Freq: Two times a day (BID) | ORAL | 0 refills | Status: DC

## 2023-06-08 NOTE — Progress Notes (Signed)
IV team attempted to obtain PIV access fo an 18G in the [facility] for CT angio. IV team unsuccessful in obtaining IV. 20G remains in [facility].

## 2023-06-08 NOTE — Progress Notes (Signed)
Discharge instructions (including medications) discussed with and copy provided to patient/caregiver  Patient removed her own IV's for discharge.

## 2023-06-08 NOTE — Plan of Care (Signed)
  Problem: Education: Goal: Knowledge of General Education information will improve Description: Including pain rating scale, medication(s)/side effects and non-pharmacologic comfort measures Outcome: Progressing   Problem: Health Behavior/Discharge Planning: Goal: Ability to manage health-related needs will improve Outcome: Progressing   Problem: Clinical Measurements: Goal: Will remain free from infection Outcome: Progressing   

## 2023-06-08 NOTE — Plan of Care (Signed)
  Problem: Education: Goal: Knowledge of General Education information will improve Description: Including pain rating scale, medication(s)/side effects and non-pharmacologic comfort measures Outcome: Adequate for Discharge   Problem: Health Behavior/Discharge Planning: Goal: Ability to manage health-related needs will improve Outcome: Adequate for Discharge   Problem: Clinical Measurements: Goal: Ability to maintain clinical measurements within normal limits will improve Outcome: Adequate for Discharge Goal: Will remain free from infection Outcome: Adequate for Discharge Goal: Diagnostic test results will improve Outcome: Adequate for Discharge Goal: Respiratory complications will improve Outcome: Adequate for Discharge Goal: Cardiovascular complication will be avoided Outcome: Adequate for Discharge   Problem: Activity: Goal: Risk for activity intolerance will decrease Outcome: Adequate for Discharge   Problem: Nutrition: Goal: Adequate nutrition will be maintained Outcome: Adequate for Discharge   Problem: Coping: Goal: Level of anxiety will decrease Outcome: Adequate for Discharge   Problem: Elimination: Goal: Will not experience complications related to bowel motility Outcome: Adequate for Discharge Goal: Will not experience complications related to urinary retention Outcome: Adequate for Discharge   Problem: Pain Management: Goal: General experience of comfort will improve Outcome: Adequate for Discharge   Problem: Safety: Goal: Ability to remain free from injury will improve Outcome: Adequate for Discharge   Problem: Skin Integrity: Goal: Risk for impaired skin integrity will decrease Outcome: Adequate for Discharge   Problem: Education: Goal: Knowledge of General Education information will improve Description: Including pain rating scale, medication(s)/side effects and non-pharmacologic comfort measures Outcome: Adequate for Discharge   Problem: Health  Behavior/Discharge Planning: Goal: Ability to manage health-related needs will improve Outcome: Adequate for Discharge   Problem: Clinical Measurements: Goal: Ability to maintain clinical measurements within normal limits will improve Outcome: Adequate for Discharge Goal: Will remain free from infection Outcome: Adequate for Discharge Goal: Diagnostic test results will improve Outcome: Adequate for Discharge Goal: Respiratory complications will improve Outcome: Adequate for Discharge Goal: Cardiovascular complication will be avoided Outcome: Adequate for Discharge   Problem: Activity: Goal: Risk for activity intolerance will decrease Outcome: Adequate for Discharge   Problem: Nutrition: Goal: Adequate nutrition will be maintained Outcome: Adequate for Discharge   Problem: Coping: Goal: Level of anxiety will decrease Outcome: Adequate for Discharge   Problem: Elimination: Goal: Will not experience complications related to bowel motility Outcome: Adequate for Discharge Goal: Will not experience complications related to urinary retention Outcome: Adequate for Discharge   Problem: Pain Management: Goal: General experience of comfort will improve Outcome: Adequate for Discharge   Problem: Safety: Goal: Ability to remain free from injury will improve Outcome: Adequate for Discharge   Problem: Skin Integrity: Goal: Risk for impaired skin integrity will decrease Outcome: Adequate for Discharge

## 2023-06-08 NOTE — Progress Notes (Signed)
Patient Name: Gwendolyn Edwards Date of Encounter: 06/08/2023 [facility] Cardiologist: Nona Dell, MD   Interval Summary  .    She continues to have chest discomfort with ambulation.  She is unable to lay flat in bed due to chest pain.  Vital Signs .    Vitals:   06/08/23 0447 06/08/23 0448 06/08/23 0449 06/08/23 0903  BP:  100/60  (!) 115/97  Pulse: 77 72 70 68  Resp: (!) 25 13 18 13   Temp:  98.3 F (36.8 C)  98.2 F (36.8 C)  TempSrc:  Axillary  Oral  SpO2: 100% 100% 100% 100%  Weight:      Height:        Intake/Output Summary (Last 24 hours) at 06/08/2023 0918 Last data filed at 06/07/2023 2135 Gross per 24 hour  Intake 118 ml  Output --  Net 118 ml      06/05/2023    9:36 PM 06/05/2023   11:17 AM 03/28/2023    8:11 PM  Last 3 Weights  Weight (lbs) 279 lb 12.8 oz 260 lb 266 lb 15.6 oz  Weight (kg) 126.916 kg 117.935 kg 121.1 kg      Telemetry/ECG    Echo 06/06/23:    1. Left ventricular ejection fraction, by estimation, is 55 to 60%. The  left ventricle has normal function. The left ventricle has no regional  wall motion abnormalities. Left ventricular diastolic parameters were  normal.   2. Right ventricular systolic function is normal. The right ventricular  size is normal.   3. The mitral valve is normal in structure. No evidence of mitral valve  regurgitation. No evidence of mitral stenosis.   4. The aortic valve is tricuspid. Aortic valve regurgitation is not  visualized. No aortic stenosis is present.   5. The inferior vena cava is dilated in size with <50% respiratory  variability, suggesting right atrial pressure of 15 mmHg.  - Personally Reviewed  Physical Exam .    VS:  BP (!) 115/97 (BP Location: Left Arm)   Pulse 68   Temp 98.2 F (36.8 C) (Oral)   Resp 13   Ht 5\' 2"  (1.575 m)   Wt 126.9 kg   LMP 05/10/2023   SpO2 100%   BMI 51.18 kg/m  , BMI Body mass index is 51.18 kg/m. GENERAL:  Well  appearing HEENT: Pupils equal round and reactive, fundi not visualized, oral mucosa unremarkable NECK:  No jugular venous distention, waveform within normal limits, carotid upstroke brisk and symmetric, no bruits, no thyromegaly LUNGS:  Clear to auscultation bilaterally HEART:  RRR.  PMI not displaced or sustained,S1 and S2 within normal limits, no S3, no S4, no clicks, no rubs, no murmurs ABD:  Flat, positive bowel sounds normal in frequency in pitch, no bruits, no rebound, no guarding, no midline pulsatile mass, no hepatomegaly, no splenomegaly EXT:  2 plus pulses throughout, no edema, no cyanosis no clubbing SKIN:  No rashes no nodules NEURO:  Cranial nerves II through XII grossly intact, motor grossly intact throughout PSYCH:  Cognitively intact, oriented to person place and time  Assessment & Plan .     89F with hypertension, nonobstructive CAD, GERD, and recurrent pericarditis.  # Pericarditis: # Chest pain: # Nonobstructive CAD: Patient had minimal CAD on cath.  She has persistent despite ibuprofen and the plan was for coronary CTA today.  Hopefully she will be getting IV and she goes down for her study today.  Cardiac enzymes are negative.  Systolic function is normal on echo.  Overall, this is very low likelihood for ischemia.  If she cannot get and IV I would not recommend cardiac cath.  Consider Lexiscan Myoview if patient insists. Recommend starting rilonacept (Arcalyst) as an outpatient.  Given that this is her third episode of pericarditis and the second episode within 1 year.  Can you colchicine for 3 months.  Continue NSAIDs for 2 weeks and then taper.  # Hypertension: Blood pressure well-controlled on losartan, HCTZ, and amlodipine.  # Dispo:  OK to discharge home if CT is negative.   For questions or updates, please contact [facility] Please consult [email or web address] for contact info under        Signed, Chilton Si, MD

## 2023-06-08 NOTE — Discharge Summary (Cosign Needed Addendum)
Discharge Summary    Patient ID: Gwendolyn Edwards MRN: [id number]; DOB: 16-Jul-1975  Admit date: 06/05/2023 Discharge date: 06/08/2023  PCP:  Ignatius Specking, MD   [facility] Providers Cardiologist:  Nona Dell, MD        Discharge Diagnoses    Principal Problem:   Recurrent idiopathic pericarditis Active Problems:   Benign hypertension   Precordial pain   Class 3 severe obesity due to excess calories without serious comorbidity with body mass index (BMI) of 50.0 to 59.9 in adult [facility])    Diagnostic Studies/Procedures    Cor CTA today EXAM: Cardiac/Coronary  CT   TECHNIQUE: The patient was scanned on a [employer].   PROTOCOL: A non-contrast, gated CT scan was obtained with axial slices of 3 mm through the heart for calcium scoring. Calcium scoring was performed using the Agatston method. A 120 kV prospective, gated, contrast cardiac scan was obtained. Gantry rotation speed was 250 msecs and collimation was 0.6 mm. Two sublingual nitroglycerin tablets (0.8 mg) were given. The 3D data set was reconstructed in 5% intervals of the 35-75% of the R-R cycle. Diastolic phases were analyzed on a dedicated workstation using MPR, MIP, and VRT modes. The patient received 95 cc of contrast.   FINDINGS: Image quality: Excellent.   Artifact: Limited.   Coronary artery calcification score: 0   Coronary arteries: Normal coronary origins.  Right dominance.   Left Main Coronary Artery: Normal caliber vessel, originates from the left coronary cusp, bifurcates to form a left anterior descending artery (LAD) and a left circumflex artery (LCX). There is no plaque or stenosis.   Left Anterior Descending Artery: Normal caliber vessel, wraps the apex, gives off 2 diagonal branches. The LAD is patent.   First Diagonal branch: Patent.   Second Diagonal branch: Patent.   Left Circumflex Artery: Normal caliber vessel, non-dominant, travels within  the atrioventricular groove, gives off 2 patent obtuse marginal branches. The LCX is patent.   Right Coronary Artery: Dominant vessel, originates from the right coronary cusp, minimal luminal irregularities due to soft plaque without evidence of stenosis.   Left Atrium: Grossly normal in size with no left atrial appendage filling defect.   Left Ventricle: Grossly normal in size. There is no abnormal filling defect.   Pulmonary arteries: Normal in size without proximal filling defect.   Pulmonary veins: Normal pulmonary venous drainage.   Aorta: Normal size, 28.1 mm at the mid ascending aorta (level of the PA bifurcation) measured double oblique. No calcifications. No dissection.   Pericardium: Normal thickness with no significant effusion or calcium present.   Aortic Valve:Trileaflet without significant calcification.   Mitral Valve: Normal structure without significant calcification.   Extra-cardiac findings: See attached radiology report for non-cardiac structures.   IMPRESSION: 1. Coronary calcium score of 0 . This was 1st percentile for age-, sex, and race-matched controls.   2. Total plaque volume (TPV) 7 mm3 which is 47th percentile for age- and sex-matched controls (calcified plaque 0 mm3; non-calcified plaque 7 mm3 (low attenuation plaque 0 mm3)). TPV is mild.   3. Normal coronary origin with right dominance.   4. CAD-RADS = 1 Minimal non-obstructive CAD.   RECOMMENDATIONS:   Consider non-atherosclerotic causes of chest pain. Consider preventive therapy and risk factor modification.     Electronically Signed   By: Tessa Lerner D.O.   On: 06/08/2023 13:13   _____________   History of Present Illness     Gwendolyn Edwards is a  47 y.o. female with hypertension, recurrent pericarditis, minimal CAD by cath 2020, GERD who was admitted 06/06/23 with chest pain felt due to recurrent pericarditis. She recently recovered from a viral illness with upper  respiratory symptoms that lasted for several days. Subsequently after that she developed chest tightness and pain that radiated to both shoulders. She hasreported multiple episodes of pericarditis that have all happened after viral illnesses and this was similar to that. She also noted that at some point she told maybe she had an autoimmune condition given her recurrent pericarditis, however notes that workup has always been unremarkable. She was hemodynamically stable with negative troponins. She was admitted to cardiology for further evaluation.    Hospital Course   1. Chest pain felt due to recurrent pericarditis; chest pain with nonobstructive CAD - hSTroponins negx2, CRP wnl, ESR elevated to 64 - CXR NAD - 2D echo 06/06/23 EF 55-60%, no pericardial effusion - Due to persistent chest pain she underwent coronary CTA today with CAC of 0, TPV mild, minimal nonobstructive CAD. Overread pending at time of DC, will return to Dr. Emelda Brothers inbox when complete.  - can revisit risk factor modification as outpatient since primary focus of treatment will be pericarditis - per Dr. Duke Salvia continue colchicine for 3 months, continue ibuprofen 800mg  TID x 2 weeks (has 12 more days through 12/27), then decrease to 800mg  BID x 4 days then 800mg  daily x 3 days then stop  - Dr. Duke Salvia suggests to start rilonacept (Arcalyst) as an outpatient given that this is her third episode of pericarditis and the second episode within 1 year   2. Hypertension Blood pressure well-controlled on losartan, HCTZ, metoprolol, amlodipine, continued on regimen here.  Dr. Duke Salvia has seen and examined the patient today and feels she is stable for discharge. Per Dr. Duke Salvia, patient to remain out of work for 1 week given physical job. Work note provided. Pt requested meds sent to [facility] in [place].     Did the patient have an acute coronary syndrome (MI, NSTEMI, STEMI, etc) this admission?:  No                                Did the patient have a percutaneous coronary intervention (stent / angioplasty)?:  No.          _____________  Discharge Vitals Blood pressure 105/60, pulse 60, temperature 98.5 F (36.9 C), temperature source Oral, resp. rate 17, height 5\' 2"  (1.575 m), weight 126.9 kg, last menstrual period 05/10/2023, SpO2 100%.  Filed Weights   06/05/23 1117 06/05/23 2136  Weight: 117.9 kg 126.9 kg    Labs & Radiologic Studies    CBC Recent Labs    06/06/23 0243  WBC 8.3  HGB 11.4*  HCT 36.4  MCV 80.7  PLT 290   Basic Metabolic Panel Recent Labs    82/95/62 0243  NA 135  K 3.5  CL 111  CO2 20*  GLUCOSE 119*  BUN 9  CREATININE 0.70  CALCIUM 7.9*   Liver Function Tests Recent Labs    06/06/23 0243  AST 12*  ALT 13  ALKPHOS 71  BILITOT 0.5  PROT 6.3*  ALBUMIN 2.8*   No results for input(s): "LIPASE", "AMYLASE" in the last 72 hours. High Sensitivity Troponin:   Recent Labs  Lab 06/05/23 1342 06/05/23 1519  TROPONINIHS 4 4    BNP Invalid input(s): "POCBNP" D-Dimer No results for input(s): "DDIMER" in the  last 72 hours. Hemoglobin A1C No results for input(s): "HGBA1C" in the last 72 hours. Fasting Lipid Panel No results for input(s): "CHOL", "HDL", "LDLCALC", "TRIG", "CHOLHDL", "LDLDIRECT" in the last 72 hours. Thyroid Function Tests No results for input(s): "TSH", "T4TOTAL", "T3FREE", "THYROIDAB" in the last 72 hours.  Invalid input(s): "FREET3" _____________  CT CORONARY MORPH W/CTA COR W/SCORE W/CA W/CM &/OR WO/CM Result Date: 06/08/2023 HISTORY: Chest pain, nonspecific EXAM: Cardiac/Coronary  CT TECHNIQUE: The patient was scanned on a [employer]. PROTOCOL: A non-contrast, gated CT scan was obtained with axial slices of 3 mm through the heart for calcium scoring. Calcium scoring was performed using the Agatston method. A 120 kV prospective, gated, contrast cardiac scan was obtained. Gantry rotation speed was 250 msecs and collimation was 0.6 mm.  Two sublingual nitroglycerin tablets (0.8 mg) were given. The 3D data set was reconstructed in 5% intervals of the 35-75% of the R-R cycle. Diastolic phases were analyzed on a dedicated workstation using MPR, MIP, and VRT modes. The patient received 95 cc of contrast. FINDINGS: Image quality: Excellent. Artifact: Limited. Coronary artery calcification score: 0 Coronary arteries: Normal coronary origins.  Right dominance. Left Main Coronary Artery: Normal caliber vessel, originates from the left coronary cusp, bifurcates to form a left anterior descending artery (LAD) and a left circumflex artery (LCX). There is no plaque or stenosis. Left Anterior Descending Artery: Normal caliber vessel, wraps the apex, gives off 2 diagonal branches. The LAD is patent. First Diagonal branch: Patent. Second Diagonal branch: Patent. Left Circumflex Artery: Normal caliber vessel, non-dominant, travels within the atrioventricular groove, gives off 2 patent obtuse marginal branches. The LCX is patent. Right Coronary Artery: Dominant vessel, originates from the right coronary cusp, minimal luminal irregularities due to soft plaque without evidence of stenosis. Left Atrium: Grossly normal in size with no left atrial appendage filling defect. Left Ventricle: Grossly normal in size. There is no abnormal filling defect. Pulmonary arteries: Normal in size without proximal filling defect. Pulmonary veins: Normal pulmonary venous drainage. Aorta: Normal size, 28.1 mm at the mid ascending aorta (level of the PA bifurcation) measured double oblique. No calcifications. No dissection. Pericardium: Normal thickness with no significant effusion or calcium present. Aortic Valve:Trileaflet without significant calcification. Mitral Valve: Normal structure without significant calcification. Extra-cardiac findings: See attached radiology report for non-cardiac structures. IMPRESSION: 1. Coronary calcium score of 0 . This was 1st percentile for age-, sex,  and race-matched controls. 2. Total plaque volume (TPV) 7 mm3 which is 47th percentile for age- and sex-matched controls (calcified plaque 0 mm3; non-calcified plaque 7 mm3 (low attenuation plaque 0 mm3)). TPV is mild. 3. Normal coronary origin with right dominance. 4. CAD-RADS = 1 Minimal non-obstructive CAD. RECOMMENDATIONS: Consider non-atherosclerotic causes of chest pain. Consider preventive therapy and risk factor modification. Electronically Signed   By: Tessa Lerner D.O.   On: 06/08/2023 13:13   ECHOCARDIOGRAM COMPLETE Result Date: 06/06/2023    ECHOCARDIOGRAM REPORT   Patient Name:   Gwendolyn Edwards Date of Exam: 06/06/2023 Medical Rec #:  [id number]               Height:       62.0 in Accession #:    [id number]              Weight:       279.8 lb Date of Birth:  May 19, 1976               BSA:  2.205 m Patient Age:    47 years                BP:           127/69 mmHg Patient Gender: F                       HR:           60 bpm. Exam Location:  Inpatient Procedure: 2D Echo, Cardiac Doppler and Color Doppler Indications:    Chest Pain  History:        Patient has prior history of Echocardiogram examinations, most                 recent 06/06/2019. Pericarditis.  Sonographer:    Harriette Bouillon RDCS Referring Phys: [id number] SUNIT TOLIA IMPRESSIONS  1. Left ventricular ejection fraction, by estimation, is 55 to 60%. The left ventricle has normal function. The left ventricle has no regional wall motion abnormalities. Left ventricular diastolic parameters were normal.  2. Right ventricular systolic function is normal. The right ventricular size is normal.  3. The mitral valve is normal in structure. No evidence of mitral valve regurgitation. No evidence of mitral stenosis.  4. The aortic valve is tricuspid. Aortic valve regurgitation is not visualized. No aortic stenosis is present.  5. The inferior vena cava is dilated in size with <50% respiratory variability, suggesting right atrial pressure of  15 mmHg. Comparison(s): A prior study was performed on 06/06/2019. LVEF remains stable, RV systolic function on current study is preserved, and RWMA on current study. FINDINGS  Left Ventricle: Left ventricular ejection fraction, by estimation, is 55 to 60%. The left ventricle has normal function. The left ventricle has no regional wall motion abnormalities. The left ventricular internal cavity size was normal in size. There is  no left ventricular hypertrophy. Left ventricular diastolic parameters were normal. Right Ventricle: The right ventricular size is normal. No increase in right ventricular wall thickness. Right ventricular systolic function is normal. Left Atrium: Left atrial size was normal in size. Right Atrium: Right atrial size was normal in size. Pericardium: There is no evidence of pericardial effusion. Mitral Valve: The mitral valve is normal in structure. No evidence of mitral valve regurgitation. No evidence of mitral valve stenosis. Tricuspid Valve: The tricuspid valve is normal in structure. Tricuspid valve regurgitation is not demonstrated. No evidence of tricuspid stenosis. Aortic Valve: The aortic valve is tricuspid. Aortic valve regurgitation is not visualized. No aortic stenosis is present. Pulmonic Valve: The pulmonic valve was normal in structure. Pulmonic valve regurgitation is not visualized. No evidence of pulmonic stenosis. Aorta: The aortic root is normal in size and structure. Venous: The inferior vena cava is dilated in size with less than 50% respiratory variability, suggesting right atrial pressure of 15 mmHg. IAS/Shunts: The atrial septum is grossly normal.  LEFT VENTRICLE PLAX 2D LVIDd:         4.90 cm   Diastology LVIDs:         3.10 cm   LV e' medial:    9.48 cm/s LV PW:         1.00 cm   LV E/e' medial:  10.8 LV IVS:        1.00 cm   LV e' lateral:   10.60 cm/s LVOT diam:     1.90 cm   LV E/e' lateral: 9.6 LV SV:         63 LV SV Index:  29 LVOT Area:     2.84 cm  RIGHT  VENTRICLE             IVC RV S prime:     13.40 cm/s  IVC diam: 2.20 cm TAPSE (M-mode): 2.3 cm LEFT ATRIUM             Index        RIGHT ATRIUM           Index LA diam:        4.20 cm 1.90 cm/m   RA Area:     13.20 cm LA Vol (A2C):   67.8 ml 30.75 ml/m  RA Volume:   33.30 ml  15.10 ml/m LA Vol (A4C):   68.5 ml 31.07 ml/m LA Biplane Vol: 70.7 ml 32.06 ml/m  AORTIC VALVE LVOT Vmax:   117.00 cm/s LVOT Vmean:  72.000 cm/s LVOT VTI:    0.223 m  AORTA Ao Root diam: 2.80 cm Ao Asc diam:  3.00 cm MITRAL VALVE MV Area (PHT): 4.33 cm     SHUNTS MV Decel Time: 175 msec     Systemic VTI:  0.22 m MV E velocity: 102.00 cm/s  Systemic Diam: 1.90 cm MV A velocity: 81.90 cm/s MV E/A ratio:  1.25 Sunit Tolia Electronically signed by Tessa Lerner Signature Date/Time: 06/06/2023/4:27:18 PM    Final    DG Chest Port 1 View Result Date: 06/05/2023 CLINICAL DATA:  Generalized body aches with back pain and fevers EXAM: PORTABLE CHEST 1 VIEW COMPARISON:  Chest radiograph dated 11/16/2019 FINDINGS: Normal lung volumes. Unchanged linear density across the left mid lung, likely scarring. No pleural effusion or pneumothorax. The heart size and mediastinal contours are within normal limits. No acute osseous abnormality. IMPRESSION: No active disease. Electronically Signed   By: Agustin Cree M.D.   On: 06/05/2023 14:54   Disposition   Pt is being discharged home today in good condition.  Follow-up Plans & Appointments     Follow-up Information     Sharlene Dory, NP Follow up.   Specialty: Cardiology Why: Humberto Seals [facility] office - cardiology follow-up arranged on Wednesday Jul 01, 2023 at 8:30 AM. Arrive 15 minutes prior to appointment to check in.  **PLEASE NOTE THIS IS AT OUR [place] OFFICE LOCATION, rather than [place]. We did not have any appointment times available in [place] during the timeframe you'll need to be seen. If you need to cancel or reschedule this appointment time, please call the office.* Contact  information: [address] [place] [address] [phone]                Discharge Instructions     Diet - low sodium heart healthy   Complete by: As directed    Discharge instructions   Complete by: As directed    Treatment plan for pericarditis: - Continue ibuprofen on the schedule provided. If you are still having symptoms at your follow-up appointment, we may need to extend this. Take this with food. If you notice any stomach upset or signs of gastrointestinal bleeding such as blood in stool or black stools, stop this medicine and call your doctor immediately - Continue pantoprazole to protect your stomach while taking ibuprofen. - Continue colchicine for 3 months.  We removed ondansetron from your medicine list since you indicated you no longer take this.  The instructions listed on your furosemide were from the way the original prescription was written. You may continue to take this on an  as-needed basis only as you were doing before admission.   Increase activity slowly   Complete by: As directed         Discharge Medications   Allergies as of 06/08/2023       Reactions   Penicillins Swelling   Doxycycline    Hives and itching   Augmentin [amoxicillin-pot Clavulanate] Swelling, Rash   Tongue swells         Medication List     STOP taking these medications    ondansetron 4 MG disintegrating tablet Commonly known as: ZOFRAN-ODT       TAKE these medications    amLODipine 5 MG tablet Commonly known as: NORVASC Take 1 tablet by mouth daily   BIOTIN PO Take 1 tablet by mouth daily.   busPIRone 15 MG tablet Commonly known as: BUSPAR Take 15 mg by mouth 2 (two) times daily.   colchicine 0.6 MG tablet Take 1 tablet (0.6 mg total) by mouth 2 (two) times daily. For 3 months then stop.   furosemide 20 MG tablet Commonly known as: Lasix Take 1 tablet (20 mg total) by mouth 2 (two) times daily for 2 days then take 1 tablet daily as  needed What changed:  when to take this reasons to take this Notes to patient: The instructions listed on your furosemide were from the way the original prescription was written. You may continue to take this on an as-needed basis only as you were doing before admission.   gabapentin 300 MG capsule Commonly known as: NEURONTIN 1 capsule (300 mg total) every morning AND 2 capsules (600 mg total) at bedtime.   ibuprofen 800 MG tablet Commonly known as: ADVIL Take 1 tablet (800 mg total) by mouth 3 (three) times daily for 12 days, THEN 1 tablet (800 mg total) in the morning and at bedtime for 4 days, THEN 1 tablet (800 mg total) daily for 3 days. Start taking on: June 08, 2023   losartan-hydrochlorothiazide 100-25 MG tablet Commonly known as: HYZAAR Take 1 tablet by mouth every morning.   methocarbamol 500 MG tablet Commonly known as: ROBAXIN Take 1 tablet (500 mg total) by mouth 2 (two) times daily.   metoprolol succinate 100 MG 24 hr tablet Commonly known as: TOPROL-XL Take 1 tablet (100 mg total) by mouth daily. Take with or immediately following a meal.   MULTIVITAMIN PO Take 1 tablet by mouth daily.   pantoprazole 40 MG tablet Commonly known as: PROTONIX Take 1 tablet by mouth daily.   rizatriptan 10 MG disintegrating tablet Commonly known as: MAXALT-MLT Dissolve 1 tablet (10 mg total) by mouth as needed for migraine. May repeat in 2 hours if needed   topiramate 25 MG tablet Commonly known as: TOPAMAX Take 25 mg by mouth 2 (two) times daily.   venlafaxine XR 75 MG 24 hr capsule Commonly known as: EFFEXOR-XR Take 1 capsule by mouth daily (in addition to 150 mg dose).   venlafaxine XR 150 MG 24 hr capsule Commonly known as: EFFEXOR-XR Take 1 capsule by mouth daily.   vitamin B-12 100 MCG tablet Commonly known as: CYANOCOBALAMIN Take 100 mcg by mouth daily.           Outstanding Labs/Studies   N/A  Duration of Discharge Encounter   Greater than 30  minutes including physician time.  Signed, Laurann Montana, PA-C 06/08/2023, 2:48 PM

## 2023-07-01 ENCOUNTER — Ambulatory Visit: Payer: Self-pay | Attending: Nurse Practitioner | Admitting: Nurse Practitioner

## 2023-07-01 NOTE — Progress Notes (Deleted)
  Cardiology Office Note:  .   Date:  07/01/2023  ID:  Annamae GORMAN Castor, DOB 01/10/1976, MRN [id number] PCP: Rosamond Leta NOVAK, MD  [facility] Providers Cardiologist:  Jayson Sierras, MD { Click to update primary MD,subspecialty MD or APP then REFRESH:1}   History of Present Illness: .   Gwendolyn Edwards is a 48 y.o. female with a PMH of recurrent idiopathic pericarditis, hypertension, chest pain, nonobstructive CAD, obesity, and GERD, who presents today for hospital follow-up.  Cardiac catheterization in 2020 revealed minimal CAD.   Hospitalization in December 2024 for recurrent pericarditis, had recently covered from a viral illness with upper respiratory symptoms that lasted for several days.  She noted to have past history of multiple episodes of pericarditis after viral illnesses.  She says that at some point she was told she had an autoimmune condition due to her recurrent pericarditis.  However, notes revealed that workup was unremarkable.  Coronary CTA in hospital showed a coronary calcium score of 0, evidence of mild/minimal nonobstructive CAD.  Echocardiogram revealed EF 55 to 60%, no pericardial effusion.  Dr. Raford recommended colchicine  for 3 months, continue ibuprofen  800 mg 3 times daily x 2 weeks and then decrease to 800 mg twice daily x 4 days, then 800 mg daily x 3 days then stop.  Dr. Raford also suggested to start Arcalyst as outpatient given this is her third episode of pericarditis and the second episode was in 1 year.  Today she presents for hospital follow-up.  She states...  ROS: ***  Studies Reviewed: .        *** Risk Assessment/Calculations:   {Does this patient have ATRIAL FIBRILLATION?[phone]} No BP recorded.  {Refresh Note OR Click here to enter BP  :1}***       Physical Exam:   VS:  LMP 05/10/2023    Wt Readings from Last 3 Encounters:  06/05/23 279 lb 12.8 oz (126.9 kg)  03/28/23 266 lb 15.6 oz (121.1 kg)  01/28/23 267 lb  (121.1 kg)    GEN: Well nourished, well developed in no acute distress NECK: No JVD; No carotid bruits CARDIAC: ***RRR, no murmurs, rubs, gallops RESPIRATORY:  Clear to auscultation without rales, wheezing or rhonchi  ABDOMEN: Soft, non-tender, non-distended EXTREMITIES:  No edema; No deformity   ASSESSMENT AND PLAN: .   ***    {Are you ordering a CV Procedure (e.g. stress test, cath, DCCV, TEE, etc)?   Press F2        :[id number]}  Dispo: ***  Signed, Almarie Crate, NP

## 2023-11-02 ENCOUNTER — Other Ambulatory Visit (HOSPITAL_BASED_OUTPATIENT_CLINIC_OR_DEPARTMENT_OTHER): Payer: Self-pay

## 2023-12-11 ENCOUNTER — Emergency Department (HOSPITAL_COMMUNITY)

## 2023-12-11 ENCOUNTER — Other Ambulatory Visit: Payer: Self-pay

## 2023-12-11 ENCOUNTER — Encounter (HOSPITAL_COMMUNITY): Payer: Self-pay | Admitting: Emergency Medicine

## 2023-12-11 ENCOUNTER — Inpatient Hospital Stay (HOSPITAL_COMMUNITY)
Admission: EM | Admit: 2023-12-11 | Discharge: 2023-12-14 | DRG: 313 | Disposition: A | Discharge status: Home / Self Care | Attending: Internal Medicine | Admitting: Internal Medicine

## 2023-12-11 ENCOUNTER — Telehealth: Payer: Self-pay | Admitting: Cardiology

## 2023-12-11 DIAGNOSIS — R600 Localized edema: Secondary | ICD-10-CM

## 2023-12-11 DIAGNOSIS — G43909 Migraine, unspecified, not intractable, without status migrainosus: Secondary | ICD-10-CM | POA: Diagnosis present

## 2023-12-11 DIAGNOSIS — Z79899 Other long term (current) drug therapy: Secondary | ICD-10-CM

## 2023-12-11 DIAGNOSIS — R55 Syncope and collapse: Secondary | ICD-10-CM | POA: Diagnosis present

## 2023-12-11 DIAGNOSIS — K219 Gastro-esophageal reflux disease without esophagitis: Secondary | ICD-10-CM | POA: Diagnosis present

## 2023-12-11 DIAGNOSIS — F39 Unspecified mood [affective] disorder: Secondary | ICD-10-CM | POA: Diagnosis present

## 2023-12-11 DIAGNOSIS — R072 Precordial pain: Secondary | ICD-10-CM | POA: Diagnosis not present

## 2023-12-11 DIAGNOSIS — Z880 Allergy status to penicillin: Secondary | ICD-10-CM

## 2023-12-11 DIAGNOSIS — F419 Anxiety disorder, unspecified: Secondary | ICD-10-CM | POA: Diagnosis present

## 2023-12-11 DIAGNOSIS — Z8669 Personal history of other diseases of the nervous system and sense organs: Secondary | ICD-10-CM

## 2023-12-11 DIAGNOSIS — Z841 Family history of disorders of kidney and ureter: Secondary | ICD-10-CM

## 2023-12-11 DIAGNOSIS — Z8349 Family history of other endocrine, nutritional and metabolic diseases: Secondary | ICD-10-CM

## 2023-12-11 DIAGNOSIS — I319 Disease of pericardium, unspecified: Secondary | ICD-10-CM | POA: Diagnosis present

## 2023-12-11 DIAGNOSIS — Z808 Family history of malignant neoplasm of other organs or systems: Secondary | ICD-10-CM

## 2023-12-11 DIAGNOSIS — R079 Chest pain, unspecified: Principal | ICD-10-CM | POA: Diagnosis present

## 2023-12-11 DIAGNOSIS — I10 Essential (primary) hypertension: Secondary | ICD-10-CM | POA: Diagnosis present

## 2023-12-11 DIAGNOSIS — G8929 Other chronic pain: Secondary | ICD-10-CM | POA: Diagnosis present

## 2023-12-11 DIAGNOSIS — I447 Left bundle-branch block, unspecified: Secondary | ICD-10-CM | POA: Diagnosis present

## 2023-12-11 DIAGNOSIS — I2510 Atherosclerotic heart disease of native coronary artery without angina pectoris: Secondary | ICD-10-CM | POA: Diagnosis present

## 2023-12-11 DIAGNOSIS — Z6841 Body Mass Index (BMI) 40.0 and over, adult: Secondary | ICD-10-CM

## 2023-12-11 DIAGNOSIS — R7982 Elevated C-reactive protein (CRP): Secondary | ICD-10-CM | POA: Diagnosis present

## 2023-12-11 DIAGNOSIS — Z8249 Family history of ischemic heart disease and other diseases of the circulatory system: Secondary | ICD-10-CM

## 2023-12-11 DIAGNOSIS — Z8679 Personal history of other diseases of the circulatory system: Secondary | ICD-10-CM

## 2023-12-11 DIAGNOSIS — I890 Lymphedema, not elsewhere classified: Secondary | ICD-10-CM | POA: Diagnosis present

## 2023-12-11 DIAGNOSIS — Z882 Allergy status to sulfonamides status: Secondary | ICD-10-CM

## 2023-12-11 DIAGNOSIS — Z881 Allergy status to other antibiotic agents status: Secondary | ICD-10-CM

## 2023-12-11 DIAGNOSIS — E66813 Obesity, class 3: Secondary | ICD-10-CM | POA: Diagnosis present

## 2023-12-11 LAB — D-DIMER, QUANTITATIVE: D-Dimer, Quant: 0.63 ug{FEU}/mL — ABNORMAL HIGH (ref 0.00–0.50)

## 2023-12-11 LAB — BASIC METABOLIC PANEL WITH GFR
Anion gap: 11 (ref 5–15)
BUN: 9 mg/dL (ref 6–20)
CO2: 21 mmol/L — ABNORMAL LOW (ref 22–32)
Calcium: 8.8 mg/dL — ABNORMAL LOW (ref 8.9–10.3)
Chloride: 104 mmol/L (ref 98–111)
Creatinine, Ser: 0.91 mg/dL (ref 0.44–1.00)
GFR, Estimated: >60 mL/min (ref >60)
Glucose, Bld: 119 mg/dL — ABNORMAL HIGH (ref 70–99)
Potassium: 3.9 mmol/L (ref 3.5–5.1)
Sodium: 136 mmol/L (ref 135–145)

## 2023-12-11 LAB — POC URINE PREG, ED: Preg Test, Ur: NEGATIVE

## 2023-12-11 LAB — CBC
HCT: 38.8 % (ref 36.0–46.0)
Hemoglobin: 12.6 g/dL (ref 12.0–15.0)
MCH: 26.3 pg (ref 26.0–34.0)
MCHC: 32.5 g/dL (ref 30.0–36.0)
MCV: 80.8 fL (ref 80.0–100.0)
Platelets: 395 10*3/uL (ref 150–400)
RBC: 4.8 MIL/uL (ref 3.87–5.11)
RDW: 16.2 % — ABNORMAL HIGH (ref 11.5–15.5)
WBC: 9.6 10*3/uL (ref 4.0–10.5)
nRBC: 0 % (ref 0.0–0.2)

## 2023-12-11 LAB — C-REACTIVE PROTEIN: CRP: 1.2 mg/dL — ABNORMAL HIGH (ref <1.0)

## 2023-12-11 LAB — TROPONIN I (HIGH SENSITIVITY)
Troponin I (High Sensitivity): 3 ng/L (ref <18)
Troponin I (High Sensitivity): 3 ng/L (ref <18)

## 2023-12-11 LAB — SEDIMENTATION RATE: Sed Rate: 30 mm/h (ref 0–30)

## 2023-12-11 MED ORDER — ACETAMINOPHEN 500 MG PO TABS
1000.0000 mg | ORAL_TABLET | Freq: Once | ORAL | Status: AC
  Administered 2023-12-11: 1000 mg via ORAL
  Filled 2023-12-11: qty 2

## 2023-12-11 MED ORDER — MORPHINE SULFATE (PF) 4 MG/ML IV SOLN
4.0000 mg | Freq: Once | INTRAVENOUS | Status: AC
  Administered 2023-12-11: 4 mg via INTRAVENOUS
  Filled 2023-12-11: qty 1

## 2023-12-11 MED ORDER — ASPIRIN 325 MG PO TABS
325.0000 mg | ORAL_TABLET | Freq: Once | ORAL | Status: AC
  Administered 2023-12-11: 325 mg via ORAL
  Filled 2023-12-11: qty 1

## 2023-12-11 MED ORDER — COLCHICINE 0.6 MG PO TABS
0.6000 mg | ORAL_TABLET | Freq: Once | ORAL | Status: AC
  Administered 2023-12-11: 0.6 mg via ORAL
  Filled 2023-12-11: qty 1

## 2023-12-11 MED ORDER — KETOROLAC TROMETHAMINE 30 MG/ML IJ SOLN
30.0000 mg | Freq: Once | INTRAMUSCULAR | Status: AC
  Administered 2023-12-11: 30 mg via INTRAVENOUS
  Filled 2023-12-11: qty 1

## 2023-12-11 MED ORDER — IOHEXOL 350 MG/ML SOLN
75.0000 mL | Freq: Once | INTRAVENOUS | Status: AC | PRN
  Administered 2023-12-11: 75 mL via INTRAVENOUS

## 2023-12-11 MED ORDER — NITROGLYCERIN 0.4 MG SL SUBL
0.4000 mg | SUBLINGUAL_TABLET | Freq: Once | SUBLINGUAL | Status: AC
  Administered 2023-12-11: 0.4 mg via SUBLINGUAL
  Filled 2023-12-11: qty 1

## 2023-12-11 MED ORDER — ONDANSETRON HCL 4 MG/2ML IJ SOLN
4.0000 mg | Freq: Once | INTRAMUSCULAR | Status: AC
  Administered 2023-12-11: 4 mg via INTRAVENOUS
  Filled 2023-12-11: qty 2

## 2023-12-11 NOTE — ED Provider Notes (Signed)
 [place] EMERGENCY DEPARTMENT AT [facility] Provider Note  CSN: [id number] Arrival date & time: 12/11/23 1350  Chief Complaint(s) Chest Pain  HPI Keniesha Adderly Harte-Galloway is a 48 y.o. female with PMH pericarditis, HTN IBS, shingles, anxiety, acid reflux who presents emergency department for evaluation of chest pain, exertional shortness of breath and syncope.  Patient states that 3 days ago she had a syncopal episode in the shower that was unwitnessed.  Since then she has had persistent exertional chest pain that feels pressure-like.  She states that when she walks to the car she feels that he needs to change her shirt because she is sweating so much.  Here in the ER she endorses a sensation of a baby sitting on her chest.  Denies abdominal pain, nausea, vomiting, headache, fever or other systemic symptoms.  States that this feels distinctly different than her pericarditis pain.   Past Medical History Past Medical History:  Diagnosis Date   Abscess of neck 2019   Acid reflux    Anxiety    Essential hypertension    GERD (gastroesophageal reflux disease)    Hemorrhoids    Hypertension    IBS (irritable bowel syndrome)    OA (osteoarthritis) of knee    Pericarditis    Possible pericarditis/myocarditis December 2020   Shingles 07/2020   recurrent   Snoring    Patient Active Problem List   Diagnosis Date Noted   Precordial pain 06/07/2023   Class 3 severe obesity due to excess calories without serious comorbidity with body mass index (BMI) of 50.0 to 59.9 in adult 06/07/2023   Pericarditis, acute, idiopathic 06/05/2023   Patient desires pregnancy 12/17/2020   Encounter for screening fecal occult blood testing 12/17/2020   Benign hypertension    Acid reflux    Anxiety    Recurrent idiopathic pericarditis 06/08/2019   Chest pain at rest 06/05/2019   Hypertensive heart disease 12/01/2018   Screening for colorectal cancer 12/01/2018   Encounter for gynecological  examination with Papanicolaou smear of cervix 12/01/2018   Abscess of neck 02/08/2018   Home Medication(s) Prior to Admission medications   Medication Sig Start Date End Date Taking? Authorizing Provider  amLODipine  (NORVASC ) 5 MG tablet Take 1 tablet by mouth daily 01/31/22     BIOTIN PO Take 1 tablet by mouth daily.    [provider]  busPIRone  (BUSPAR ) 15 MG tablet Take 15 mg by mouth 2 (two) times daily.    [provider]  colchicine  0.6 MG tablet Take 1 tablet (0.6 mg total) by mouth 2 (two) times daily. For 3 months then stop. 06/08/23   Maudine Sos, MD  furosemide  (LASIX ) 20 MG tablet Take 1 tablet (20 mg total) by mouth 2 (two) times daily for 2 days then take 1 tablet daily as needed Patient taking differently: Take 20 mg by mouth as needed. 03/06/22     gabapentin  (NEURONTIN ) 300 MG capsule 1 capsule (300 mg total) every morning AND 2 capsules (600 mg total) at bedtime. 04/17/22     losartan -hydrochlorothiazide  (HYZAAR ) 100-25 MG tablet Take 1 tablet by mouth every morning. 04/17/22     methocarbamol  (ROBAXIN ) 500 MG tablet Take 1 tablet (500 mg total) by mouth 2 (two) times daily. 10/09/22   Leath-Warren, Belen Bowers, NP  metoprolol  succinate (TOPROL -XL) 100 MG 24 hr tablet Take 1 tablet (100 mg total) by mouth daily. Take with or immediately following a meal. 01/28/23 06/06/23  Gerard Knight, MD  Multiple Vitamin (MULTIVITAMIN  PO) Take 1 tablet by mouth daily.    [provider]  ondansetron  (ZOFRAN -ODT) 4 MG disintegrating tablet Take 4 mg by mouth every 8 (eight) hours as needed. 08/27/23   [provider]  oseltamivir (TAMIFLU) 75 MG capsule Take 75 mg by mouth every 12 (twelve) hours. 08/27/23   [provider]  pantoprazole  (PROTONIX ) 40 MG tablet Take 1 tablet by mouth daily. 05/01/22   [provider]  rizatriptan  (MAXALT -MLT) 10 MG disintegrating tablet Dissolve 1 tablet (10 mg total) by mouth as needed for migraine. May  repeat in 2 hours if needed 04/24/21   Penumalli, Brenton Cambridge, MD  topiramate  (TOPAMAX ) 25 MG tablet Take 25 mg by mouth 2 (two) times daily.    [provider]  venlafaxine  XR (EFFEXOR -XR) 75 MG 24 hr capsule Take 1 capsule by mouth daily (in addition to 150 mg dose). 07/12/21     vitamin B-12 (CYANOCOBALAMIN) 100 MCG tablet Take 100 mcg by mouth daily.    [provider]                                                                                                                                    Past Surgical History Past Surgical History:  Procedure Laterality Date   LEFT HEART CATH AND CORONARY ANGIOGRAPHY N/A 06/05/2019   Procedure: LEFT HEART CATH AND CORONARY ANGIOGRAPHY;  Surgeon: Arnoldo Lapping, MD;  Location: [facility] INVASIVE CV LAB;  Service: Cardiovascular;  Laterality: N/A;   WISDOM TOOTH EXTRACTION     Family History Family History  Problem Relation Age of Onset   Cancer Father        Osteosarcoma    Kidney disease Mother    Hepatitis C Mother    Hypertension Mother    Thyroid disease Sister     Social History Social History   Tobacco Use   Smoking status: Never   Smokeless tobacco: Never  Vaping Use   Vaping status: Never Used  Substance Use Topics   Alcohol use: No   Drug use: No   Allergies Penicillins, Sulfa  antibiotics, Augmentin [amoxicillin-pot clavulanate], and Doxycycline   Review of Systems Review of Systems  Constitutional:  Positive for diaphoresis.  Respiratory:  Positive for chest tightness and shortness of breath.   Cardiovascular:  Positive for chest pain.    Physical Exam Vital Signs  I have reviewed the triage vital signs BP (!) 160/95 (BP Location: Left Arm)   Pulse 74   Temp 98.2 F (36.8 C) (Oral)   Resp 17   Ht 5' 2 (1.575 m)   Wt 122.5 kg   LMP 11/29/2023 (Approximate)   SpO2 100%   BMI 49.38 kg/m   Physical Exam Vitals and nursing note reviewed.  Constitutional:      General: She is not in acute  distress.    Appearance: She is well-developed.  HENT:     Head: Normocephalic and atraumatic.  Eyes:     Conjunctiva/sclera: Conjunctivae normal.    Cardiovascular:     Rate and Rhythm: Normal rate and regular rhythm.     Heart sounds: No murmur heard. Pulmonary:     Effort: Pulmonary effort is normal. No respiratory distress.     Breath sounds: Normal breath sounds.  Abdominal:     Palpations: Abdomen is soft.     Tenderness: There is no abdominal tenderness.   Musculoskeletal:        General: No swelling.     Cervical back: Neck supple.   Skin:    General: Skin is warm and dry.     Capillary Refill: Capillary refill takes less than 2 seconds.   Neurological:     Mental Status: She is alert.   Psychiatric:        Mood and Affect: Mood normal.     ED Results and Treatments Labs (all labs ordered are listed, but only abnormal results are displayed) Labs Reviewed  BASIC METABOLIC PANEL WITH GFR - Abnormal; Notable for the following components:      Result Value   CO2 21 (*)    Glucose, Bld 119 (*)    Calcium 8.8 (*)    All other components within normal limits  CBC - Abnormal; Notable for the following components:   RDW 16.2 (*)    All other components within normal limits  POC URINE PREG, ED  TROPONIN I (HIGH SENSITIVITY)                                                                                                                          Radiology No results found.  Pertinent labs & imaging results that were available during my care of the patient were reviewed by me and considered in my medical decision making (see MDM for details).  Medications Ordered in ED Medications  nitroGLYCERIN  (NITROSTAT ) SL tablet 0.4 mg (has no administration in time range)  aspirin  tablet 325 mg (has no administration in time range)                                                                                                                                      Procedures Procedures  (including critical care time)  Medical Decision Making / ED Course   This patient presents to the ED for concern of chest pain, this involves an extensive number  of treatment options, and is a complaint that carries with it a high risk of complications and morbidity.  The differential diagnosis includes ACS, Aortic Dissection, Pneumothorax, Pneumonia, Esophageal Rupture, PE, Tamponade/Pericardial Effusion, pericarditis, esophageal spasm, dysrhythmia, GERD, costochondritis.  MDM: Patient seen emergency room for evaluation of chest pain.  Physical exam largely unremarkable.  Laboratory evaluation is reassuring with a negative initial high-sensitivity troponin.  ECGs are mildly concerning with her native rhythm showing new T wave inversions in the lateral leads with biphasic T wave in the anterior leads as well as patient intermittently jumping into a junctional rhythm.  Given high concern for ACS aspirin  given as well as nitroglycerin .  I spoke with the cardiologist on-call after Mallapeddi who will will come to evaluate the patient.  Cardiology recommendations pending at time of signout.  Please see provider signout note for continuation of workup.   Additional history obtained: -Additional history obtained from multiple family members -External records from outside source obtained and reviewed including: Chart review including previous notes, labs, imaging, consultation notes   Lab Tests: -I ordered, reviewed, and interpreted labs.   The pertinent results include:   Labs Reviewed  BASIC METABOLIC PANEL WITH GFR - Abnormal; Notable for the following components:      Result Value   CO2 21 (*)    Glucose, Bld 119 (*)    Calcium 8.8 (*)    All other components within normal limits  CBC - Abnormal; Notable for the following components:   RDW 16.2 (*)    All other components within normal limits  POC URINE PREG, ED  TROPONIN I (HIGH SENSITIVITY)      EKG    EKG Interpretation Date/Time:  Friday December 11 2023 14:59:43 EDT Ventricular Rate:  73 PR Interval:  157 QRS Duration:  152 QT Interval:  451 QTC Calculation: 497 R Axis:   -7  Text Interpretation: Sinus rhythm Left bundle branch block Confirmed by Vinson Tietze ([id number]) on 12/11/2023 9:40:19 PM         Imaging Studies ordered: I ordered imaging studies including chest x-ray I independently visualized and interpreted imaging. I agree with the radiologist interpretation   Medicines ordered and prescription drug management: Meds ordered this encounter  Medications   nitroGLYCERIN  (NITROSTAT ) SL tablet 0.4 mg   aspirin  tablet 325 mg    -I have reviewed the patients home medicines and have made adjustments as needed  Critical interventions none  Consultations Obtained: I requested consultation with the cardiologist on-call,  and discussed lab and imaging findings as well as pertinent plan - they recommend: Recommendations are pending   Cardiac Monitoring: The patient was maintained on a cardiac monitor.  I personally viewed and interpreted the cardiac monitored which showed an underlying rhythm of: NSR, junctional rhythm  Social Determinants of Health:  Factors impacting patients care include: none   Reevaluation: After the interventions noted above, I reevaluated the patient and found that they have :improved  Co morbidities that complicate the patient evaluation  Past Medical History:  Diagnosis Date   Abscess of neck 2019   Acid reflux    Anxiety    Essential hypertension    GERD (gastroesophageal reflux disease)    Hemorrhoids    Hypertension    IBS (irritable bowel syndrome)    OA (osteoarthritis) of knee    Pericarditis    Possible pericarditis/myocarditis December 2020   Shingles 07/2020   recurrent   Snoring       Dispostion: I considered admission  for this patient, and disposition pending cardiology recommendations.  Please see provider signout  for continuation of workup.     Final Clinical Impression(s) / ED Diagnoses Final diagnoses:  None     @PCDICTATION @    Karlyn Overman, MD 12/11/23 2141

## 2023-12-11 NOTE — ED Provider Notes (Signed)
 Patient has an elevated CRP.  She will be treated for pericarditis and will be admitted by the medicine service over to [facility] with cardiology following.   Cheyenne Cotta, MD 12/11/23 [phone]

## 2023-12-11 NOTE — ED Notes (Signed)
 PT given snack with MD approval

## 2023-12-11 NOTE — ED Triage Notes (Signed)
 [facility] with c/o chest pain across her chest with radiation to left neck and into back, pt states intermittent since Fri with 1 episode of emesis Fri, denies n/v since then

## 2023-12-11 NOTE — Telephone Encounter (Signed)
 Pt walked in office c/o chest pain and SOB. Pt states that she passed out in the shower on Friday. Since then she has been having chest pain, SOB and left arm numbness. Pt reports that the pain is worse today. Pt denies hitting her head when she passed out. She reports having some nausea on Friday but none now. Rates pain as 7/10 at this time. Pt taken to ER in wheelchair with family at her side. Please advise.

## 2023-12-11 NOTE — Telephone Encounter (Signed)
   Pt c/o of Chest Pain: STAT if active CP, including tightness, pressure, jaw pain, radiating pain to shoulder/upper arm/back, CP unrelieved by Nitro. Symptoms reported of SOB, nausea, vomiting, sweating.  1. Are you having CP right now? yes    2. Are you experiencing any other symptoms (ex. SOB, nausea, vomiting, sweating)? Left arm pain, passed out Friday   3. Is your CP continuous or coming and going? continuous   4. Have you taken Nitroglycerin ? no   5. How long have you been experiencing CP? For a week but today it has been all day.    6. If NO CP at time of call then end call with telling Pt to call back or call 911 if Chest pain returns prior to return call from triage team.

## 2023-12-11 NOTE — Consult Note (Signed)
 CARDIOLOGY CONSULT NOTE    Patient ID: Gwendolyn Edwards; [id number]; 1976/01/31   Admit date: 12/11/2023 Date of Consult: 12/11/2023  Primary Care Provider: Orlena Bitters, MD Primary Cardiologist:  Primary Electrophysiologist:     History of Present Illness:   Gwendolyn Edwards is a 48 year old F known to have HTN, GERD, recurrent pericarditis presented to the ER with chest pain.  1 week prior to presentation, patient was in her shower tub where she felt she was about to pass out.  She sat down as she knew that she was passing out.  But eventually, she passed out and she does not remember the details.  Unwitnessed syncopal event.  Did not have any tongue biting, incontinence of bowels or urine.  Did not have any dizziness or palpitations prior to syncopal event.  She was on her cycle during that time.  She also has history of migraines.  She reported that it was a hurtful syncope.  Her chest and shoulders were hurting bad when she passed out.  She thought she was dying.  After she woke up, she continued to have intermittent chest heaviness radiating to her neck and shoulders.  Eventually resolved.  She did not have any chest heaviness until this morning it started suddenly and persisted.  Did not resolve after coming to the ER.  In the last 1 week, even though she did not have any chest pains, she had soreness in her shoulders.  Never had any dizziness or palpitations.  EKG today showed NSR, diffuse T wave inversions.  Repeat EKG showed NSR and LBBB.  Upon chart review, she was recently admitted to [facility] in December 2024 for treatment of recurrent pericarditis.  She was supposed to be started on Rilonacept but unclear if she is ever started.  LHC in 2020 showed angiographically normal coronaries.  CT cardiac in 2024 showed coronary calcium score of 0 and TPV mild.  Echocardiogram showed normal LVEF, normal RV function, no valvular heart disease and a CVP of 15 mmHg.  No evidence  of LVH.  Past Medical History:  Diagnosis Date   Abscess of neck 2019   Acid reflux    Anxiety    Essential hypertension    GERD (gastroesophageal reflux disease)    Hemorrhoids    Hypertension    IBS (irritable bowel syndrome)    OA (osteoarthritis) of knee    Pericarditis    Possible pericarditis/myocarditis December 2020   Shingles 07/2020   recurrent   Snoring     Past Surgical History:  Procedure Laterality Date   LEFT HEART CATH AND CORONARY ANGIOGRAPHY N/A 06/05/2019   Procedure: LEFT HEART CATH AND CORONARY ANGIOGRAPHY;  Surgeon: Arnoldo Lapping, MD;  Location: [facility] INVASIVE CV LAB;  Service: Cardiovascular;  Laterality: N/A;   WISDOM TOOTH EXTRACTION         Inpatient Medications: Scheduled Meds:  acetaminophen   1,000 mg Oral Once   Continuous Infusions:  PRN Meds:   Allergies:    Allergies  Allergen Reactions   Penicillins Swelling   Sulfa  Antibiotics Itching   Augmentin [Amoxicillin-Pot Clavulanate] Swelling and Rash    Tongue swells    Doxycycline  Hives and Itching    Social History:   Social History   Socioeconomic History   Marital status: Legally Separated    Spouse name: Not on file   Number of children: 2   Years of education: Not on file   Highest education level: Not on file  Occupational History  Comment: [employer]  Tobacco Use   Smoking status: Never   Smokeless tobacco: Never  Vaping Use   Vaping status: Never Used  Substance and Sexual Activity   Alcohol use: No   Drug use: No   Sexual activity: Yes    Birth control/protection: None  Other Topics Concern   Not on file  Social History Narrative   Lives with family   Social Drivers of Health   Financial Resource Strain: Low Risk  (12/17/2020)   Overall Financial Resource Strain (CARDIA)    Difficulty of Paying Living Expenses: Not hard at all  Food Insecurity: No Food Insecurity (06/06/2023)   Hunger Vital Sign    Worried About Running Out of Food in the Last  Year: Never true    Ran Out of Food in the Last Year: Never true  Transportation Needs: No Transportation Needs (06/06/2023)   PRAPARE - Administrator, Civil Service (Medical): No    Lack of Transportation (Non-Medical): No  Physical Activity: Insufficiently Active (12/17/2020)   Exercise Vital Sign    Days of Exercise per Week: 1 day    Minutes of Exercise per Session: 60 min  Stress: No Stress Concern Present (12/17/2020)   [employer] of Occupational Health - Occupational Stress Questionnaire    Feeling of Stress : Not at all  Social Connections: Unknown (10/13/2022)   Received from [facility]   Social Network    Social Network: Not on file  Intimate Partner Violence: Not At Risk (08/26/2023)   Received from [facility]   HITS    Over the last 12 months how often did your partner physically hurt you?: Never    Over the last 12 months how often did your partner insult you or talk down to you?: Never    Over the last 12 months how often did your partner threaten you with physical harm?: Never    Over the last 12 months how often did your partner scream or curse at you?: Never    Family History:    Family History  Problem Relation Age of Onset   Cancer Father        Osteosarcoma    Kidney disease Mother    Hepatitis C Mother    Hypertension Mother    Thyroid disease Sister      ROS:  Please see the history of present illness.  ROS  All other ROS reviewed and negative.     Physical Exam/Data:   Vitals:   12/11/23 1359 12/11/23 1401  BP:  (!) 160/95  Pulse:  74  Resp:  17  Temp:  98.2 F (36.8 C)  TempSrc:  Oral  SpO2:  100%  Weight: 122.5 kg   Height: 5' 2 (1.575 m)    No intake or output data in the 24 hours ending 12/11/23 1604 Filed Weights   12/11/23 1359  Weight: 122.5 kg   Body mass index is 49.38 kg/m.  General:  Well nourished, well developed, in no acute distress HEENT: normal Lymph: no adenopathy Neck: no JVD Endocrine:   No thryomegaly Vascular: No carotid bruits; FA pulses 2+ bilaterally without bruits  Cardiac:  normal S1, S2; RRR; no murmur  Lungs:  clear to auscultation bilaterally, no wheezing, rhonchi or rales  Abd: soft, nontender, no hepatomegaly  Ext: no edema Musculoskeletal:  No deformities, BUE and BLE strength normal and equal Skin: warm and dry  Neuro:  CNs 2-12 intact, no focal abnormalities noted Psych:  Normal affect   Laboratory Data:  Chemistry Recent Labs  Lab 12/11/23 1405  NA 136  K 3.9  CL 104  CO2 21*  GLUCOSE 119*  BUN 9  CREATININE 0.91  CALCIUM 8.8*  GFRNONAA >60  ANIONGAP 11    No results for input(s): PROT, ALBUMIN, AST, ALT, ALKPHOS, BILITOT in the last 168 hours. Hematology Recent Labs  Lab 12/11/23 1405  WBC 9.6  RBC 4.80  HGB 12.6  HCT 38.8  MCV 80.8  MCH 26.3  MCHC 32.5  RDW 16.2*  PLT 395   Cardiac EnzymesNo results for input(s): TROPONINI in the last 168 hours. No results for input(s): TROPIPOC in the last 168 hours.  BNPNo results for input(s): BNP, PROBNP in the last 168 hours.  DDimer No results for input(s): DDIMER in the last 168 hours.  Radiology/Studies:  DG Chest 2 View Result Date: 12/11/2023 CLINICAL DATA:  Chest pain. Intermittent since 7 days ago. One episode of emesis 7 days ago. EXAM: CHEST - 2 VIEW COMPARISON:  Chest radiographs 06/05/2023, 11/16/2019 FINDINGS: Cardiac silhouette and mediastinal contours are within normal limits. The lungs are clear. No pleural effusion or pneumothorax. Moderate multilevel bridging osteophytes of the thoracic spine, similar to prior. IMPRESSION: No active cardiopulmonary disease. Electronically Signed   By: Bertina Broccoli M.D.   On: 12/11/2023 15:55    Assessment and Plan:   Syncope: Patient had 1 episode of syncope 1 week prior to presentation with no recurrences.  No dizziness or palpitations prior to syncope.  Unwitnessed syncope for unknown duration. She had another  syncope in 2020.  EKG in the ER showed NSR, diffuse T wave inversions.  Repeat EKG showed NSR, LBBB (which is new).  Event monitor in 2024 was unremarkable. Due to sporadic syncopal events and likely conduction abnormalities, she will benefit from loop recorder placement.  Repeat limited echocardiogram.  Chest pain: Patient reported intermittent chest heaviness radiating to her neck and shoulders 1 week prior to presentation when she had syncope. Chest heaviness resolved and recurred again this morning and persisted. LHC in 2020 showed angiography normal coronaries.  CT cardiac in December 2024 showed coronary calcium score of 0, minimal nonobstructive CAD and mild total plaque volume.   EKG in the ER showed NSR, diffuse T wave inversions.  Repeat EKG showed NSR, LBBB (which is new). Hs troponins 3 x 1.  Trend troponins.  Obtain D-dimer, ESR and CRP.  She has a history of recurrent pericarditis, if her ESR or CRP or both are elevated, she will benefit from colchicine  and NSAIDs/steroids and cardiac MRI.   For questions or updates, please contact [facility] Please consult [email or web address] for contact info under Cardiology/STEMI.   Signed, Teddie Mehta Priya Loraine Freid, MD 12/11/2023 4:04 PM

## 2023-12-12 DIAGNOSIS — E66813 Obesity, class 3: Secondary | ICD-10-CM

## 2023-12-12 DIAGNOSIS — R55 Syncope and collapse: Secondary | ICD-10-CM

## 2023-12-12 DIAGNOSIS — I319 Disease of pericardium, unspecified: Secondary | ICD-10-CM | POA: Diagnosis present

## 2023-12-12 DIAGNOSIS — I890 Lymphedema, not elsewhere classified: Secondary | ICD-10-CM | POA: Diagnosis present

## 2023-12-12 DIAGNOSIS — Z6841 Body Mass Index (BMI) 40.0 and over, adult: Secondary | ICD-10-CM | POA: Diagnosis not present

## 2023-12-12 DIAGNOSIS — Z79899 Other long term (current) drug therapy: Secondary | ICD-10-CM | POA: Diagnosis not present

## 2023-12-12 DIAGNOSIS — R079 Chest pain, unspecified: Secondary | ICD-10-CM | POA: Diagnosis present

## 2023-12-12 DIAGNOSIS — R600 Localized edema: Secondary | ICD-10-CM | POA: Diagnosis not present

## 2023-12-12 DIAGNOSIS — G8929 Other chronic pain: Secondary | ICD-10-CM | POA: Diagnosis present

## 2023-12-12 DIAGNOSIS — I447 Left bundle-branch block, unspecified: Secondary | ICD-10-CM | POA: Diagnosis present

## 2023-12-12 DIAGNOSIS — Z8249 Family history of ischemic heart disease and other diseases of the circulatory system: Secondary | ICD-10-CM | POA: Diagnosis not present

## 2023-12-12 DIAGNOSIS — Z808 Family history of malignant neoplasm of other organs or systems: Secondary | ICD-10-CM | POA: Diagnosis not present

## 2023-12-12 DIAGNOSIS — I300 Acute nonspecific idiopathic pericarditis: Secondary | ICD-10-CM

## 2023-12-12 DIAGNOSIS — Z8679 Personal history of other diseases of the circulatory system: Secondary | ICD-10-CM | POA: Diagnosis not present

## 2023-12-12 DIAGNOSIS — K219 Gastro-esophageal reflux disease without esophagitis: Secondary | ICD-10-CM | POA: Diagnosis present

## 2023-12-12 DIAGNOSIS — R7982 Elevated C-reactive protein (CRP): Secondary | ICD-10-CM | POA: Diagnosis present

## 2023-12-12 DIAGNOSIS — Z882 Allergy status to sulfonamides status: Secondary | ICD-10-CM | POA: Diagnosis not present

## 2023-12-12 DIAGNOSIS — Z880 Allergy status to penicillin: Secondary | ICD-10-CM | POA: Diagnosis not present

## 2023-12-12 DIAGNOSIS — I159 Secondary hypertension, unspecified: Secondary | ICD-10-CM | POA: Diagnosis not present

## 2023-12-12 DIAGNOSIS — Z8349 Family history of other endocrine, nutritional and metabolic diseases: Secondary | ICD-10-CM | POA: Diagnosis not present

## 2023-12-12 DIAGNOSIS — F39 Unspecified mood [affective] disorder: Secondary | ICD-10-CM | POA: Diagnosis present

## 2023-12-12 DIAGNOSIS — Z743 Need for continuous supervision: Secondary | ICD-10-CM | POA: Diagnosis not present

## 2023-12-12 DIAGNOSIS — G43909 Migraine, unspecified, not intractable, without status migrainosus: Secondary | ICD-10-CM | POA: Diagnosis present

## 2023-12-12 DIAGNOSIS — I10 Essential (primary) hypertension: Secondary | ICD-10-CM | POA: Diagnosis present

## 2023-12-12 DIAGNOSIS — F419 Anxiety disorder, unspecified: Secondary | ICD-10-CM | POA: Diagnosis present

## 2023-12-12 DIAGNOSIS — R072 Precordial pain: Secondary | ICD-10-CM | POA: Diagnosis present

## 2023-12-12 DIAGNOSIS — Z881 Allergy status to other antibiotic agents status: Secondary | ICD-10-CM | POA: Diagnosis not present

## 2023-12-12 DIAGNOSIS — Z841 Family history of disorders of kidney and ureter: Secondary | ICD-10-CM | POA: Diagnosis not present

## 2023-12-12 DIAGNOSIS — I2510 Atherosclerotic heart disease of native coronary artery without angina pectoris: Secondary | ICD-10-CM | POA: Diagnosis present

## 2023-12-12 LAB — HIV ANTIBODY (ROUTINE TESTING W REFLEX): HIV Screen 4th Generation wRfx: NONREACTIVE

## 2023-12-12 LAB — MAGNESIUM: Magnesium: 2.1 mg/dL (ref 1.7–2.4)

## 2023-12-12 LAB — CBC
HCT: 39.2 % (ref 36.0–46.0)
Hemoglobin: 12 g/dL (ref 12.0–15.0)
MCH: 25.1 pg — ABNORMAL LOW (ref 26.0–34.0)
MCHC: 30.6 g/dL (ref 30.0–36.0)
MCV: 81.8 fL (ref 80.0–100.0)
Platelets: 339 10*3/uL (ref 150–400)
RBC: 4.79 MIL/uL (ref 3.87–5.11)
RDW: 16.2 % — ABNORMAL HIGH (ref 11.5–15.5)
WBC: 7.5 10*3/uL (ref 4.0–10.5)
nRBC: 0 % (ref 0.0–0.2)

## 2023-12-12 LAB — BASIC METABOLIC PANEL WITH GFR
Anion gap: 8 (ref 5–15)
BUN: 11 mg/dL (ref 6–20)
CO2: 24 mmol/L (ref 22–32)
Calcium: 8.3 mg/dL — ABNORMAL LOW (ref 8.9–10.3)
Chloride: 107 mmol/L (ref 98–111)
Creatinine, Ser: 0.79 mg/dL (ref 0.44–1.00)
GFR, Estimated: >60 mL/min (ref >60)
Glucose, Bld: 139 mg/dL — ABNORMAL HIGH (ref 70–99)
Potassium: 3.9 mmol/L (ref 3.5–5.1)
Sodium: 139 mmol/L (ref 135–145)

## 2023-12-12 LAB — PHOSPHORUS: Phosphorus: 3.9 mg/dL (ref 2.5–4.6)

## 2023-12-12 MED ORDER — SODIUM CHLORIDE 0.9 % IV BOLUS
500.0000 mL | Freq: Once | INTRAVENOUS | Status: AC
  Administered 2023-12-12: 500 mL via INTRAVENOUS

## 2023-12-12 MED ORDER — LOSARTAN POTASSIUM 50 MG PO TABS
100.0000 mg | ORAL_TABLET | Freq: Every day | ORAL | Status: DC
  Administered 2023-12-12 – 2023-12-14 (×3): 100 mg via ORAL
  Filled 2023-12-12: qty 4
  Filled 2023-12-12 (×2): qty 2

## 2023-12-12 MED ORDER — VENLAFAXINE HCL ER 75 MG PO CP24
75.0000 mg | ORAL_CAPSULE | Freq: Every day | ORAL | Status: DC
  Administered 2023-12-12 – 2023-12-14 (×3): 75 mg via ORAL
  Filled 2023-12-12: qty 2
  Filled 2023-12-12 (×2): qty 1

## 2023-12-12 MED ORDER — PANTOPRAZOLE SODIUM 40 MG PO TBEC
40.0000 mg | DELAYED_RELEASE_TABLET | Freq: Every day | ORAL | Status: DC
  Administered 2023-12-12 – 2023-12-14 (×3): 40 mg via ORAL
  Filled 2023-12-12 (×2): qty 1

## 2023-12-12 MED ORDER — IBUPROFEN 400 MG PO TABS: 600.0000 mg | ORAL_TABLET | Freq: Three times a day (TID) | ORAL | Status: DC

## 2023-12-12 MED ORDER — OXYCODONE HCL 5 MG PO TABS
2.5000 mg | ORAL_TABLET | Freq: Four times a day (QID) | ORAL | Status: DC | PRN
  Administered 2023-12-12 (×2): 2.5 mg via ORAL
  Filled 2023-12-12 (×2): qty 1

## 2023-12-12 MED ORDER — BUSPIRONE HCL 5 MG PO TABS
15.0000 mg | ORAL_TABLET | Freq: Two times a day (BID) | ORAL | Status: DC
  Administered 2023-12-12 – 2023-12-14 (×6): 15 mg via ORAL
  Filled 2023-12-12 (×7): qty 3

## 2023-12-12 MED ORDER — SODIUM CHLORIDE 0.9% FLUSH
3.0000 mL | Freq: Two times a day (BID) | INTRAVENOUS | Status: DC
  Administered 2023-12-12 – 2023-12-14 (×5): 3 mL via INTRAVENOUS

## 2023-12-12 MED ORDER — ONDANSETRON HCL 4 MG/2ML IJ SOLN
4.0000 mg | Freq: Four times a day (QID) | INTRAMUSCULAR | Status: DC | PRN
  Administered 2023-12-12 – 2023-12-13 (×3): 4 mg via INTRAVENOUS
  Filled 2023-12-12 (×3): qty 2

## 2023-12-12 MED ORDER — POLYETHYLENE GLYCOL 3350 17 G PO PACK
17.0000 g | PACK | Freq: Every day | ORAL | Status: DC | PRN
  Administered 2023-12-13 – 2023-12-14 (×2): 17 g via ORAL
  Filled 2023-12-12 (×2): qty 1

## 2023-12-12 MED ORDER — MORPHINE SULFATE (PF) 2 MG/ML IV SOLN
2.0000 mg | INTRAVENOUS | Status: DC | PRN
  Administered 2023-12-12 – 2023-12-14 (×10): 2 mg via INTRAVENOUS
  Filled 2023-12-12 (×10): qty 1

## 2023-12-12 MED ORDER — GABAPENTIN 300 MG PO CAPS
300.0000 mg | ORAL_CAPSULE | Freq: Three times a day (TID) | ORAL | Status: DC
  Administered 2023-12-12 – 2023-12-14 (×7): 300 mg via ORAL
  Filled 2023-12-12 (×7): qty 1

## 2023-12-12 MED ORDER — MELATONIN 3 MG PO TABS
6.0000 mg | ORAL_TABLET | Freq: Every evening | ORAL | Status: DC | PRN
  Administered 2023-12-13: 6 mg via ORAL
  Filled 2023-12-12: qty 2

## 2023-12-12 MED ORDER — OXYCODONE HCL 5 MG PO TABS
5.0000 mg | ORAL_TABLET | Freq: Four times a day (QID) | ORAL | Status: DC | PRN
  Administered 2023-12-13 – 2023-12-14 (×4): 5 mg via ORAL
  Filled 2023-12-12 (×4): qty 1

## 2023-12-12 MED ORDER — TOPIRAMATE 25 MG PO TABS
25.0000 mg | ORAL_TABLET | Freq: Two times a day (BID) | ORAL | Status: DC
  Administered 2023-12-12 – 2023-12-14 (×6): 25 mg via ORAL
  Filled 2023-12-12 (×6): qty 1

## 2023-12-12 MED ORDER — ENOXAPARIN SODIUM 60 MG/0.6ML IJ SOSY
60.0000 mg | PREFILLED_SYRINGE | INTRAMUSCULAR | Status: DC
  Administered 2023-12-12 – 2023-12-14 (×2): 60 mg via SUBCUTANEOUS
  Filled 2023-12-12 (×3): qty 0.6

## 2023-12-12 MED ORDER — IBUPROFEN 600 MG PO TABS
800.0000 mg | ORAL_TABLET | Freq: Three times a day (TID) | ORAL | Status: DC
  Administered 2023-12-12 – 2023-12-14 (×8): 800 mg via ORAL
  Filled 2023-12-12 (×8): qty 1

## 2023-12-12 MED ORDER — ACETAMINOPHEN 500 MG PO TABS
1000.0000 mg | ORAL_TABLET | Freq: Four times a day (QID) | ORAL | Status: DC | PRN
  Administered 2023-12-14: 1000 mg via ORAL
  Filled 2023-12-12: qty 2

## 2023-12-12 MED ORDER — COLCHICINE 0.6 MG PO TABS
0.6000 mg | ORAL_TABLET | Freq: Two times a day (BID) | ORAL | Status: DC
  Administered 2023-12-12 – 2023-12-14 (×5): 0.6 mg via ORAL
  Filled 2023-12-12 (×5): qty 1

## 2023-12-12 MED ORDER — ALBUTEROL SULFATE (2.5 MG/3ML) 0.083% IN NEBU: 2.5000 mg | INHALATION_SOLUTION | RESPIRATORY_TRACT | Status: DC | PRN

## 2023-12-12 NOTE — Progress Notes (Signed)
  Progress Note  Patient Name: Gwendolyn Edwards Date of Encounter: 12/12/2023 [facility] Cardiologist: Jayson Sierras, MD   Interval Summary   Presented to the ER with chest pain with 1 week prior to presentation was in her shower when she felt like she was going to pass out she sat down but eventually did pass out and does not remember the details unwitnessed event.  Her chest and shoulders were hurting bad when she passed out.  She reported that she thought she was dying.  After she woke up she began to have intermittent chest heaviness radiating to her neck and shoulders which eventually resolved and then it started suddenly again in the morning of the ER visit and did not stop.   Vital Signs Vitals:   12/12/23 1300 12/12/23 1329 12/12/23 1400 12/12/23 1500  BP: 106/66  110/60 (!) 145/84  Pulse: (!) 59  65 95  Resp: 16  12 (!) 25  Temp:  97.7 F (36.5 C)  97.6 F (36.4 C)  TempSrc:  Oral  Oral  SpO2: 93%  92% 98%  Weight:      Height:        Intake/Output Summary (Last 24 hours) at 12/12/2023 1636 Last data filed at 12/12/2023 0415 Gross per 24 hour  Intake 500 ml  Output --  Net 500 ml      12/11/2023    1:59 PM 06/05/2023    9:36 PM 06/05/2023   11:17 AM  Last 3 Weights  Weight (lbs) 270 lb 279 lb 12.8 oz 260 lb  Weight (kg) 122.471 kg 126.916 kg 117.935 kg      Telemetry/ECG  NSR - Personally Reviewed  Physical Exam  GEN: No acute distress.   Neck: No JVD Cardiac: RRR, no murmurs, rubs, or gallops.  Respiratory: Clear to auscultation bilaterally. GI: Soft, nontender, non-distended  MS: No edema  Assessment & Plan  48 year old with recurrent pericarditis -ESR normal, high-sensitivity troponin normal, minimal elevation in CRP 1.2 -Transferred here from [facility] for cardiac MRI -In the past she was prescribed Rilonacept but unclear if she is ever started.  LHC in 2020 showed angiographically normal coronaries.  CT cardiac in 2024 showed  coronary calcium score of 0 and TPV mild.  Echocardiogram showed normal LVEF, normal RV function, no valvular heart disease and a CVP of 15 mmHg.  No evidence of LVH.  Syncope - Apparently had 1 episode of syncope prior to presentation with no recurrence.  EKG demonstrated left bundle branch block.  Normal sinus rhythm.  Event monitor in 2024 was unremarkable.  Loop recorder was recommended by Dr. Adelfa Gaunt given conduction abnormality, left bundle branch block and sporadic syncopal events.  LBBB -Intermittent left bundle branch block noted with sinus rhythm.  Prior to this her EKGs demonstrated nonspecific T wave changes with T wave inversion noted in precordial leads and in the 2020 EKG had biphasic T waves in lead V3 and V2 concerning for pericarditis.  Cardiac MRI will be helpful to see if there is any evidence of inflammatory condition.     For questions or updates, please contact [facility] Please consult [email or web address] for contact info under       Signed, Oneil Parchment, MD

## 2023-12-12 NOTE — Progress Notes (Signed)
 Progress Note   Patient: Gwendolyn Edwards FMW:[id number] DOB: [birth year] DOA: 12/11/2023     0 DOS: the patient was seen and examined on 12/12/2023   Brief hospital admission narrative: As per H&P written by Dr. Keturah C20 125 Gwendolyn Edwards is a 48 y.o. female with hx of recurrent pericarditis, mild non-obs CAD, HTN, GERD, who presents with recurrent episode of chest pain and recent syncopal episode. Reports that last Friday on 6/13 she developed chest pain which was sharp and radiating to her back between her shoulders.  She had been getting ready for work and was in the shower at the time, became re: syncopal and laid down on benches that she has installed.  However ultimately still had a syncopal episode unknown how long she was unconscious for.  Pain had resolved later in the day after this episode.  However yesterday developed recurrent episode of chest pain radiating to her back so sought care.  Otherwise reports recent dyspnea on exertion and mild swelling in her legs.  No exertional chest pain.  Denies any preceding viral type illness, fever, chills, cough/cold, myalgias, GI symptoms, rashes.  Assessment and plan 1-suspected pericarditis - Patient with recurring history of pericarditis - Case discussed with cardiology service who recommended transfer to [facility] for cardiac MRI and further evaluation - Inflammatory markers demonstrating just mild elevation in her CRP - ESR within normal limits and high sensitive troponin negative. - Continue treatment as recommended with the use of ibuprofen  and colchicine . - Follow further recommendation by cardiology service.  2-syncope - EKG demonstrating new left bundle branch block - Most likely associated with pericarditis process - Cardiac imaging has been ordered as per cardiology recommendation - Will maintain adequate hydration and follow clinical response - Patient denies lightheadedness or presyncopal symptoms at  the moment.  3-GERD/GI prophylaxis - Continue PPI.  4-mild nonobstructive coronary artery disease - At home not receiving treatment with aspirin  or statin - Continue to follow further recommendation by cardiology service.  5-hypertension - Continue treatment with losartan  - Given concern for mild dehydration holding HCTZ at the moment - Follow vital signs.  6-lower extremity edema - Chronic and most likely associated with mild lymphedema from obesity - Initially holding diuretics - Patient advised to keep legs elevated.  7-mood disorder - No suicidal ideation or hallucination - Flat affect appreciated - Continue treatment with BuSpar  and venlafaxine   8-history of migraines - Continue treatment with Topamax  - Currently denying headaches.  9-chronic pain/idiopathic neuropathy - Continue treatment with gabapentin .   Subjective:  Reporting some chest pressure at time of examination; not requiring oxygen  supplementation and expressing no shortness of breath sensation.  Physical Exam: Vitals:   12/12/23 0300 12/12/23 0400 12/12/23 0632 12/12/23 0757  BP: 126/75 129/77  125/76  Pulse: (!) 54 (!) 56  70  Resp: 14 15  16   Temp:   (!) 97.5 F (36.4 C) 97.6 F (36.4 C)  TempSrc:   Oral Oral  SpO2: 96% 97%  97%  Weight:      Height:       General exam: Alert, awake, oriented x 3; denying syncopal symptoms.  No shortness of breath, no nausea, no vomiting.  Complaining of chest pressure discomfort. Respiratory system: Good saturation on room air; no using accessory muscles. Cardiovascular system:RRR. No rubs or gallops; unable to assess JVD with habitus. Gastrointestinal system: Abdomen is obese, nondistended, soft and nontender. No organomegaly or masses felt. Normal bowel sounds heard. Central nervous system: No  focal neurological deficits. Extremities: No cyanosis or clubbing; 1-2+ edema appreciated bilaterally. Skin: No petechiae. Psychiatry: Judgement and insight appear  normal.  Flat affect appreciated on exam.   Data Reviewed: Basic metabolic panel: Sodium 139, potassium 3.9, chloride 107, bicarb 24, BUN 11, creatinine 0.79 and GFR >60 CBC: White blood cell 7.5, hemoglobin 12.0 and platelet count 339K Magnesium :2.1 ANA: pending  Family Communication: No family at bedside.  Disposition: Status is: Inpatient Remains inpatient appropriate because: Continue IV therapy and follow further recommendation per cardiology service.  Anticipate discharge back home once medically stable.   Time spent: 50 minutes  Author: Eric Nunnery, MD 12/12/2023 9:20 AM  For on call review [email or web address].

## 2023-12-12 NOTE — H&P (Addendum)
 History and Physical    Gwendolyn Edwards FMW:[id number] DOB: 11/15/1975 DOA: 12/11/2023  PCP: Gwendolyn Leta NOVAK, MD   Patient coming from: Home   Chief Complaint:  Chief Complaint  Patient presents with   Chest Pain    HPI:  Gwendolyn Edwards is a 48 y.o. female with hx of recurrent pericarditis, mild non-obs CAD, HTN, GERD, who presents with recurrent episode of chest pain and recent syncopal episode. Reports that last Friday on 6/13 she developed chest pain which was sharp and radiating to her back between her shoulders.  She had been getting ready for work and was in the shower at the time, became re: syncopal and laid down on benches that she has installed.  However ultimately still had a syncopal episode unknown how long she was unconscious for.  Pain had resolved later in the day after this episode.  However yesterday developed recurrent episode of chest pain radiating to her back so sought care.  Otherwise reports recent dyspnea on exertion and mild swelling in her legs.  No exertional chest pain.  Denies any preceding viral type illness, fever, chills, cough/cold, myalgias, GI symptoms, rashes   Review of Systems:  ROS complete and negative except as marked above   Allergies  Allergen Reactions   Penicillins Swelling   Sulfa  Antibiotics Itching   Augmentin [Amoxicillin-Pot Clavulanate] Swelling and Rash    Tongue swells    Doxycycline  Hives and Itching    Prior to Admission medications   Medication Sig Start Date End Date Taking? Authorizing Provider  amLODipine  (NORVASC ) 5 MG tablet Take 1 tablet by mouth daily 01/31/22  Yes   BIOTIN PO Take 1 tablet by mouth daily.   Yes [provider]  busPIRone  (BUSPAR ) 15 MG tablet Take 15 mg by mouth 2 (two) times daily.   Yes [provider]  furosemide  (LASIX ) 20 MG tablet Take 1 tablet (20 mg total) by mouth 2 (two) times daily for 2 days then take 1 tablet daily as needed Patient taking differently:  Take 20 mg by mouth as needed for edema or fluid. 03/06/22  Yes   gabapentin  (NEURONTIN ) 300 MG capsule 1 capsule (300 mg total) every morning AND 2 capsules (600 mg total) at bedtime. 04/17/22  Yes   losartan -hydrochlorothiazide  (HYZAAR ) 100-25 MG tablet Take 1 tablet by mouth every morning. 04/17/22  Yes   MAGNESIUM  SULFATE PO Take 1 tablet by mouth daily.   Yes [provider]  metoprolol  succinate (TOPROL -XL) 100 MG 24 hr tablet Take 1 tablet (100 mg total) by mouth daily. Take with or immediately following a meal. 01/28/23 12/11/23 Yes Debera Jayson MATSU, MD  Multiple Vitamin (MULTIVITAMIN PO) Take 1 tablet by mouth daily.   Yes [provider]  naproxen  sodium (ALEVE ) 220 MG tablet Take 440 mg by mouth daily as needed (pain).   Yes [provider]  ondansetron  (ZOFRAN -ODT) 4 MG disintegrating tablet Take 4 mg by mouth every 8 (eight) hours as needed for nausea or vomiting. 08/27/23  Yes [provider]  pantoprazole  (PROTONIX ) 40 MG tablet Take 1 tablet by mouth daily. 05/01/22  Yes [provider]  rizatriptan  (MAXALT -MLT) 10 MG disintegrating tablet Dissolve 1 tablet (10 mg total) by mouth as needed for migraine. May repeat in 2 hours if needed 04/24/21  Yes Penumalli, Vikram R, MD  topiramate  (TOPAMAX ) 25 MG tablet Take 25 mg by mouth 2 (two) times daily.   Yes [provider]  venlafaxine  XR (EFFEXOR -XR) 75 MG  24 hr capsule Take 1 capsule by mouth daily (in addition to 150 mg dose). 07/12/21  Yes   vitamin B-12 (CYANOCOBALAMIN) 100 MCG tablet Take 100 mcg by mouth daily.   Yes [provider]    Past Medical History:  Diagnosis Date   Abscess of neck 2019   Acid reflux    Anxiety    Essential hypertension    GERD (gastroesophageal reflux disease)    Hemorrhoids    Hypertension    IBS (irritable bowel syndrome)    OA (osteoarthritis) of knee    Pericarditis    Possible pericarditis/myocarditis December 2020   Shingles 07/2020    recurrent   Snoring     Past Surgical History:  Procedure Laterality Date   LEFT HEART CATH AND CORONARY ANGIOGRAPHY N/A 06/05/2019   Procedure: LEFT HEART CATH AND CORONARY ANGIOGRAPHY;  Surgeon: Wonda Sharper, MD;  Location: [facility] INVASIVE CV LAB;  Service: Cardiovascular;  Laterality: N/A;   WISDOM TOOTH EXTRACTION       reports that she has never smoked. She has never used smokeless tobacco. She reports that she does not drink alcohol and does not use drugs.  Family History  Problem Relation Age of Onset   Cancer Father        Osteosarcoma    Kidney disease Mother    Hepatitis C Mother    Hypertension Mother    Thyroid disease Sister      Physical Exam: Vitals:   12/11/23 2330 12/12/23 0000 12/12/23 0030 12/12/23 0138  BP:      Pulse: 64 (!) 56 72   Resp: 18 (!) 23 (!) 24   Temp:    (!) 97.5 F (36.4 C)  TempSrc:    Oral  SpO2: 98% 97% 98%   Weight:      Height:        Gen: Awake, alert, NAD,   CV: Regular, normal S1, S2, no murmurs. No rub heard  Resp: Normal WOB, CTAB  Abd: Flat, normoactive, nontender MSK: Symmetric, no edema  Skin: No rashes or lesions to exposed skin  Neuro: Alert and interactive  Psych: euthymic, appropriate    Data review:   Labs reviewed, notable for:   Hs trop neg  CRP 1.2  ESR wnl  Bicarb 21 no AG  WBC 9  Ddimer 0.6   Micro:  Results for orders placed or performed during the hospital encounter of 06/05/23  SARS Coronavirus 2 by RT PCR (hospital order, performed in [facility] hospital lab) *cepheid single result test* Anterior Nasal Swab     Status: None   Collection Time: 06/05/23 11:20 AM   Specimen: Anterior Nasal Swab  Result Value Ref Range Status   SARS Coronavirus 2 by RT PCR NEGATIVE NEGATIVE Final    Comment: (NOTE) SARS-CoV-2 target nucleic acids are NOT DETECTED.  The SARS-CoV-2 RNA is generally detectable in upper and lower respiratory specimens during the acute phase of infection. The  lowest concentration of SARS-CoV-2 viral copies this assay can detect is 250 copies / mL. A negative result does not preclude SARS-CoV-2 infection and should not be used as the sole basis for treatment or other patient management decisions.  A negative result may occur with improper specimen collection / handling, submission of specimen other than nasopharyngeal swab, presence of viral mutation(s) within the areas targeted by this assay, and inadequate number of viral copies (<250 copies / mL). A negative result must be combined with clinical observations, patient history, and epidemiological  information.  Fact Sheet for Patients:   RoadLapTop.co.za  Fact Sheet for Healthcare Providers: http://kim-miller.com/  This test is not yet approved or  cleared by the [place]  FDA and has been authorized for detection and/or diagnosis of SARS-CoV-2 by FDA under an Emergency Use Authorization (EUA).  This EUA will remain in effect (meaning this test can be used) for the duration of the COVID-19 declaration under Section 564(b)(1) of the Act, 21 U.S.C. section 360bbb-3(b)(1), unless the authorization is terminated or revoked sooner.  Performed at [facility], [address], [place], [place] [address]     Imaging reviewed:  CT Angio Chest PE W and/or Wo Contrast Result Date: 12/11/2023 CLINICAL DATA:  High probability pulmonary embolism EXAM: CT ANGIOGRAPHY CHEST WITH CONTRAST TECHNIQUE: Multidetector CT imaging of the chest was performed using the standard protocol during bolus administration of intravenous contrast. Multiplanar CT image reconstructions and MIPs were obtained to evaluate the vascular anatomy. RADIATION DOSE REDUCTION: This exam was performed according to the departmental dose-optimization program which includes automated exposure control, adjustment of the mA and/or kV according to patient size and/or use of iterative  reconstruction technique. CONTRAST:  75mL OMNIPAQUE  IOHEXOL  350 MG/ML SOLN COMPARISON:  None Available. FINDINGS: Cardiovascular: No filling defects within the pulmonary arteries to suggest acute pulmonary embolism. Mediastinum/Nodes: No axillary or supraclavicular adenopathy. No mediastinal or hilar adenopathy. No pericardial fluid. Esophagus normal. Lungs/Pleura: No pulmonary infarction. No pneumonia. No pleural fluid. No pneumothorax Upper Abdomen: Limited view of the liver, kidneys, pancreas are unremarkable. Normal adrenal glands. Musculoskeletal: No aggressive osseous lesion. Review of the MIP images confirms the above findings. IMPRESSION: 1. No evidence acute pulmonary embolism. 2. No acute pulmonary findings. Electronically Signed   By: Jackquline Boxer M.D.   On: 12/11/2023 18:52   DG Chest 2 View Result Date: 12/11/2023 CLINICAL DATA:  Chest pain. Intermittent since 7 days ago. One episode of emesis 7 days ago. EXAM: CHEST - 2 VIEW COMPARISON:  Chest radiographs 06/05/2023, 11/16/2019 FINDINGS: Cardiac silhouette and mediastinal contours are within normal limits. The lungs are clear. No pleural effusion or pneumothorax. Moderate multilevel bridging osteophytes of the thoracic spine, similar to prior. IMPRESSION: No active cardiopulmonary disease. Electronically Signed   By: Tanda Lyons M.D.   On: 12/11/2023 15:55   Historical data:  Per cardiology review:    LHC in 2020 showed angiographically normal coronaries.  CT cardiac in 2024 showed coronary calcium score of 0 and TPV mild.  Echocardiogram showed normal LVEF, normal RV function, no valvular heart disease and a CVP of 15 mmHg.  No evidence of LVH.   EKG:   Reviewed EKG, unclear if intermittent LBBB v correct patient info?  EKG at 13:57 with LBBB. -> 14:39 no BBBB. SR with diffuse TWI -> 14:59 back to LBBB.  In either case, LBBB would be new since previous 12/'24.   ED Course:  Cardiology consulted for chest pain, seen by Dr.  Mallipeddi, recommended for inflammatory markers and if any elevation treatment with NSAID, colchicine  v steroid and for cMR; per EDP report wanted inpatient. Has low level elevation in CRP. Also recommending for loop recorder for syncope.  Otherwise treated with aspirin  325 mg x 1, colchicine  0.6 mg, Toradol  30 mg, morphine  4 mg IV, nitroglycerin , Zofran .    Assessment/Plan:  48 y.o. female with hx recurrent pericarditis, mild non-obs CAD, HTN, GERD, who presents with recurrent episode of chest pain and recent syncopal episode.  Suspect pericarditis  Hx recurrent pericarditis last in 12/'24,  with hx post viral pericarditis previously. Evaluation for her chest pain is included in historical data above. Per outpatient records had recommended for evaluation for underlying connective tissue disease although do not see results for testing if pursued in our system. Also had been recommended for Rilonacept as outpatient due to recurrent pericarditis although had not seen outpatient cardiology since her last discharge up until this hospitalization. This admission has recurrent / intermittent symptoms x 1 week. Associated syncopal episode per below. No rub on exam. EKG with new LBBB (although note intermittent + question if may have incorrect data in chart; see EKG review above). HS trop neg. ESR wnl. CRP minimally up at 1.2. D dimer slightly positive 0.63. CTA chest negative for PE, no comment re: pericardial effusion. EDP consulted with by Dr. Mallipeddi, recommended for inflammatory markers and if any elevation treatment with NSAID, colchicine  v steroid and for cMR; per EDP report wanted inpatient.  -Transfer to [facility] for cardiac MR with and without contrast; ordered  -Start ibuprofen  800 mg 3 times daily, colchicine  0.6 mg twice daily for pericarditis -Check ANA to start; Recommend she undergo additional testing for other autoimmune conditions   Syncope, suspect vagal v cardiac.   Hx episode of  syncope on 6/14, occurred while supine, associated with episode of chest pain. EKG demonstrates new LBBB. HS trop is negative. Suspect either vagal secondary to pain or cardiac considering occurred while supine + associated with pericarditis.  - Cardiac imaging per above  - Recommended for loop recorder per Dr. Mallipeddi. Also recommended for limited TTE but did not order as cMR will address  - Give 500 cc cc IV fluid.  Then continue oral hydration.  Check orthostatics - Telemonitoring  Chronic medical problems:  Mild non-obs CAD: not on aspirin  or statin.  HTN: hold home hydrochlorothiazide  with question of dehydration. Continue Losartan  100 mg daily  LE edema: Hold home lasix .  Mood d/o: Continue home buspar , venlafaxine   Hx migraines: continue home Topiramate   ? Chronic pain: Continue home Gabapentin    Body mass index is 49.38 kg/m. Obesity class III would benefit from weight loss outpatient.     DVT prophylaxis:  Lovenox  Code Status:  Full Code Diet:  Diet Orders (From admission, onward)     Start     Ordered   12/12/23 0101  Diet Heart Room service appropriate? Yes; Fluid consistency: Thin  Diet effective now       Question Answer Comment  Room service appropriate? Yes   Fluid consistency: Thin      12/12/23 0102           Family Communication:  None   Consults:  Cardiology   Admission status:   Inpatient, Telemetry bed  Severity of Illness: The appropriate patient status for this patient is INPATIENT. Inpatient status is judged to be reasonable and necessary in order to provide the required intensity of service to ensure the patient's safety. The patient's presenting symptoms, physical exam findings, and initial radiographic and laboratory data in the context of their chronic comorbidities is felt to place them at high risk for further clinical deterioration. Furthermore, it is not anticipated that the patient will be medically stable for discharge from the hospital  within 2 midnights of admission.   * I certify that at the point of admission it is my clinical judgment that the patient will require inpatient hospital care spanning beyond 2 midnights from the point of admission due to high intensity of service, high risk for  further deterioration and high frequency of surveillance required.*   Dorn Dawson, MD [facility]  How to contact the [facility] Attending or Consulting provider 7A - 7P or covering provider during after hours 7P -7A, for this patient.  Check the care team in [facility] and look for a) attending/consulting [facility] provider listed and b) the [facility] team listed Log into [email or web address] and use [facility] universal password to access. If you do not have the password, please contact the hospital operator. Locate the [facility] provider you are looking for under [facility] and page to a number that you can be directly reached. If you still have difficulty reaching the provider, please page the [facility] (Director on Call) for the Hospitalists listed on amion for assistance.  12/12/2023, 2:27 AM

## 2023-12-12 NOTE — Plan of Care (Signed)

## 2023-12-13 DIAGNOSIS — I159 Secondary hypertension, unspecified: Secondary | ICD-10-CM | POA: Diagnosis not present

## 2023-12-13 DIAGNOSIS — I319 Disease of pericardium, unspecified: Secondary | ICD-10-CM | POA: Diagnosis not present

## 2023-12-13 DIAGNOSIS — I2510 Atherosclerotic heart disease of native coronary artery without angina pectoris: Secondary | ICD-10-CM | POA: Insufficient documentation

## 2023-12-13 DIAGNOSIS — R079 Chest pain, unspecified: Secondary | ICD-10-CM

## 2023-12-13 DIAGNOSIS — R600 Localized edema: Secondary | ICD-10-CM

## 2023-12-13 DIAGNOSIS — Z8669 Personal history of other diseases of the nervous system and sense organs: Secondary | ICD-10-CM

## 2023-12-13 MED ORDER — LIDOCAINE 5 % EX PTCH
1.0000 | MEDICATED_PATCH | Freq: Two times a day (BID) | CUTANEOUS | Status: DC | PRN
  Administered 2023-12-13: 1 via TRANSDERMAL
  Filled 2023-12-13 (×2): qty 1

## 2023-12-13 MED ORDER — LIDOCAINE 5 % EX PTCH
2.0000 | MEDICATED_PATCH | Freq: Two times a day (BID) | CUTANEOUS | Status: DC | PRN
  Administered 2023-12-13: 1 via TRANSDERMAL

## 2023-12-13 MED ORDER — BISACODYL 5 MG PO TBEC
5.0000 mg | DELAYED_RELEASE_TABLET | Freq: Every day | ORAL | Status: DC | PRN
  Administered 2023-12-13: 5 mg via ORAL
  Filled 2023-12-13: qty 1

## 2023-12-13 NOTE — Progress Notes (Signed)
  Progress Note  Patient Name: Gwendolyn Edwards Date of Encounter: 12/13/2023 [facility] Cardiologist: Jayson Sierras, MD   Interval Summary    Transferred from [facility] to [facility] for evaluation of precordial pain and history of pericarditis. Plan for cardiac MRI Resting in bed comfortably. Continues to have dull precordial pain which is chronic and stable  Vital Signs Vitals:   12/12/23 2019 12/13/23 0004 12/13/23 0503 12/13/23 0830  BP: 127/60 125/66 135/75 (!) 144/63  Pulse: 69 (!) 56 (!) 58 90  Resp: 18 18 18 18   Temp: 97.8 F (36.6 C) 97.9 F (36.6 C) 98.2 F (36.8 C) 98.1 F (36.7 C)  TempSrc: Oral Oral Oral Oral  SpO2: 99% 98% 96% 98%  Weight:      Height:        Intake/Output Summary (Last 24 hours) at 12/13/2023 1012 Last data filed at 12/13/2023 0503 Gross per 24 hour  Intake 490 ml  Output 0 ml  Net 490 ml      12/11/2023    1:59 PM 06/05/2023    9:36 PM 06/05/2023   11:17 AM  Last 3 Weights  Weight (lbs) 270 lb 279 lb 12.8 oz 260 lb  Weight (kg) 122.471 kg 126.916 kg 117.935 kg      Telemetry/ECG  Sinus rhythm and sinus tachycardia without sustained arrhythmia- Personally Reviewed  Physical Exam  GEN: No acute distress.   Neck: No JVD Cardiac: RRR, no murmurs, rubs, or gallops.  Respiratory: Clear to auscultation bilaterally. 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   LHC in 2020 showed angiographically normal coronaries.   CT cardiac in 2024 showed coronary calcium score of 0 and TPV mild.   Echocardiogram showed normal LVEF, normal RV function, no valvular heart disease and a CVP of 15 mmHg.  No evidence of LVH. Has had a very thorough  ischemic workup in the recent past as mentioned above.  No additional ischemic workup warranted at this time.  Syncope Apparently had 1 episode of syncope prior to presentation with no recurrence.   EKG demonstrated left bundle branch block.  Normal sinus rhythm.  Event monitor in 2024 was unremarkable.   Loop recorder was recommended by given conduction abnormality, left bundle branch block and sporadic syncopal events -could be considered as outpatient.  Cardiac MRI ordered - will message cMRI navigators.     For questions or updates, please contact [facility] Please consult [email or web address] for contact info under   Medical decision making: Low complexity Progress notes over the last 24 hours reviewed. Telemetry, labs, I's and O's reviewed as well.      Signed, Madonna Michele HAS, [facility]  A Division of [facility] [address], [place], [place] [address]  [place], [place] [address] Pager: [phone] Office: [phone]

## 2023-12-13 NOTE — Progress Notes (Signed)
 Progress Note    Gwendolyn Edwards   FMW:[id number]  DOB: [birth year]  DOA: 12/11/2023     1 PCP: Rosamond Leta NOVAK, MD  Initial CC: chest pain  Hospital Course: Gwendolyn Edwards is a 48 y.o. female with hx of recurrent pericarditis, mild non-obs CAD, HTN, GERD, obesity who presented to [facility] originally with recurrent episode of chest pain and recent syncopal episode.  Reported that last Friday on 6/13 she developed chest pain which was sharp and radiating to her back between her shoulders.  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  However ultimately still had a syncopal episode unknown how long she was unconscious for.   Pain had resolved later in the day after this episode.  However she then developed recurrent episode of chest pain radiating to her back so sought care.  Otherwise reports recent dyspnea on exertion and mild swelling in her legs.  No exertional chest pain.  Denies any preceding viral type illness, fever, chills, cough/cold, myalgias, GI symptoms, rashes.  She underwent workup which revealed diffuse T wave inversions on EKG. CT angio chest was unremarkable as well as CXR.  She was evaluated by cardiology at [facility] and felt to have pericarditis consistent with her chest pain. She was recommended for cardiac MRI for further workup.  She was transferred to [facility] to facilitate this.  Interval History:  Resting comfortably this morning.  Was able to take a shower.  Denied any dizziness associated with this.  Ranks her chest pain about 3/10.  Assessment and Plan: * Chest pain - hx pericarditis, u/k etiology  - Last episode appears to be December 2020 4 and evaluated with coronary CT.  CCS 0.  Minimal nonobstructive CAD per report -Now presenting again with chest pain after syncope at home.  EKG showing diffuse T wave inversions.  CT angio chest and CXR unremarkable -  [name] for cardiac MRI - Cardiology following, appreciate assistance - Continue on colchicine , ibuprofen  - Continue oxycodone /morphine  for pain control if still needed  Syncope-resolved as of 12/13/2023 Hx episode of syncope on 6/14, occurred while supine, associated with episode of chest pain. EKG demonstrates new LBBB. HS trop is negative. Suspect either vagal secondary to pain or cardiac considering occurred while supine + associated with pericarditis.  - Cardiac MRI pending - Recommended for loop recorder per Dr. Stacia.  - last echo Dec 2024; noted with normal EF 55-60%, no RWMA, normal diastology - follow up cardiac MRI - Continue telemetry  Bilateral lower extremity edema - No heart failure noted on echo in December - Some probable contribution from body habitus - Also on amlodipine  at home and could consider trial off  HTN (hypertension) - On amlodipine , losartan -HCTZ, Lasix , Toprol  at home  History of migraine - Continue Topamax   CAD (coronary artery disease) - Minimal nonobstructive CAD noted on CT coronary in December 2024  Obesity, Class III, BMI 40-49.9 (morbid obesity) - Complicates overall prognosis and care - Body mass index is 49.38 kg/m.  Anxiety - Continue BuSpar , Effexor   Acid reflux - Continue Protonix    Old records reviewed in assessment of this patient  Antimicrobials:   DVT prophylaxis:  Lovenox    Code Status:   Code Status: Full Code  Mobility Assessment (Last 72 Hours)     Mobility Assessment     Row Name 12/12/23 2100 12/12/23  1657 12/12/23 06:16:21       Does patient have an order for bedrest or is patient medically unstable No - Continue assessment No - Continue assessment No - Continue assessment     What is the highest level of mobility based on the progressive mobility assessment? Level 6 (Walks independently in room and hall) - Balance while walking in room without assist - Complete Level 6  (Walks independently in room and hall) - Balance while walking in room without assist - Complete Level 5 (Walks with assist in room/hall) - Balance while stepping forward/back and can walk in room with assist - Complete        Barriers to discharge: None Disposition Plan: Home HH orders placed: N/A Status is: Inpatient  Objective: Blood pressure (!) 144/63, pulse 90, temperature 98.1 F (36.7 C), temperature source Oral, resp. rate 18, height 5' 2 (1.575 m), weight 122.5 kg, last menstrual period 11/29/2023, SpO2 98%.  Examination:  Physical Exam Constitutional:      General: She is not in acute distress.    Appearance: Normal appearance. She is obese. She is not ill-appearing.  HENT:     Head: Normocephalic and atraumatic.     Mouth/Throat:     Mouth: Mucous membranes are moist.   Eyes:     Extraocular Movements: Extraocular movements intact.    Cardiovascular:     Rate and Rhythm: Normal rate and regular rhythm.  Pulmonary:     Effort: Pulmonary effort is normal. No respiratory distress.     Breath sounds: Normal breath sounds. No wheezing.  Abdominal:     General: Bowel sounds are normal. There is no distension.     Palpations: Abdomen is soft.     Tenderness: There is no abdominal tenderness.   Musculoskeletal:        General: Normal range of motion.     Cervical back: Normal range of motion and neck supple.     Right lower leg: Edema present.     Left lower leg: Edema present.   Skin:    General: Skin is warm and dry.   Neurological:     General: No focal deficit present.     Mental Status: She is alert.   Psychiatric:        Mood and Affect: Mood normal.      Consultants:  Cardiology  Procedures:    Data Reviewed: No results found for this or any previous visit (from the past 24 hours).  I have reviewed pertinent nursing notes, vitals, labs, and images as necessary. I have ordered labwork to follow up on as indicated.  I have reviewed the last  notes from staff over past 24 hours. I have discussed patient's care plan and test results with nursing staff, CM/SW, and other staff as appropriate.  Time spent: Greater than 50% of the 55 minute visit was spent in counseling/coordination of care for the patient as laid out in the A&P.   LOS: 1 day   Alm Apo, MD [facility] 12/13/2023, 12:20 PM

## 2023-12-13 NOTE — Assessment & Plan Note (Signed)
-   hx pericarditis, u/k etiology  - Last episode appears to be December 2024 and evaluated with coronary CT.  CCS 0.  Minimal nonobstructive CAD per report -Now presenting again with chest pain after syncope at home.  EKG showing diffuse T wave inversions.  CT angio chest and CXR unremarkable - Transferred [name] for cardiac MRI - Cardiology following, appreciate assistance - Cardiac MRI negative for pericarditis or inflammation; EF normal, 54% - 1 month colchicine  prescribed at discharge - other possible differential includes subendocardial angina? - trial of imdur at follow up? Discontinued her off amlodipine  at discharge

## 2023-12-13 NOTE — Assessment & Plan Note (Signed)
-   Complicates overall prognosis and care - Body mass index is 49.38 kg/m.

## 2023-12-13 NOTE — Assessment & Plan Note (Signed)
-   No heart failure noted on echo in December.  EF still normal on cardiac MRI - Some probable contribution from body habitus - Amlodipine  discontinued at discharge.  Outpatient follow-up for discussion of alternatives if needed

## 2023-12-13 NOTE — Hospital Course (Signed)
 Gwendolyn Edwards is a 48 y.o. female with hx of recurrent pericarditis, mild non-obs CAD, HTN, GERD, obesity who presented to [facility] originally with recurrent episode of chest pain and recent syncopal episode.  Reported that last Friday on 6/13 she developed chest pain which was sharp and radiating to her back between her shoulders.  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  However ultimately still had a syncopal episode unknown how long she was unconscious for.   Pain had resolved later in the day after this episode.  However she then developed recurrent episode of chest pain radiating to her back so sought care.  Otherwise reports recent dyspnea on exertion and mild swelling in her legs.  No exertional chest pain.  Denies any preceding viral type illness, fever, chills, cough/cold, myalgias, GI symptoms, rashes.  She underwent workup which revealed diffuse T wave inversions on EKG. CT angio chest was unremarkable as well as CXR.  She was evaluated by cardiology at [facility] and recommended to undergo cardiac MRI to further workup chest pain. She was transferred to [facility] to facilitate this. Cardiac MRI was performed on 12/14/2023.  Negative for any evidence of pericarditis or inflammation.  Ejection fraction normal, 54%. She was recommended to continue on colchicine  for 1 month at discharge.

## 2023-12-13 NOTE — Assessment & Plan Note (Addendum)
 Hx episode of syncope on 6/14, occurred while supine, associated with episode of chest pain. EKG demonstrates new LBBB. HS trop is negative. Suspect either vagal secondary to pain or cardiac considering occurred while supine + associated with pericarditis.  - Cardiac MRI pending - Recommended for loop recorder per Dr. Mallipeddi.  - last echo Dec 2024; noted with normal EF 55-60%, no RWMA, normal diastology - follow up cardiac MRI - Continue telemetry

## 2023-12-13 NOTE — Assessment & Plan Note (Signed)
-   On amlodipine , losartan -HCTZ, Lasix , Toprol  at home

## 2023-12-13 NOTE — Assessment & Plan Note (Signed)
 Continue Topamax

## 2023-12-13 NOTE — Assessment & Plan Note (Signed)
-   Minimal nonobstructive CAD noted on CT coronary in December 2024

## 2023-12-13 NOTE — Assessment & Plan Note (Signed)
-

## 2023-12-13 NOTE — Assessment & Plan Note (Addendum)
 Continue BuSpar , Effexor 

## 2023-12-13 NOTE — Plan of Care (Signed)

## 2023-12-14 ENCOUNTER — Inpatient Hospital Stay (HOSPITAL_COMMUNITY)

## 2023-12-14 ENCOUNTER — Encounter: Payer: Self-pay | Admitting: Internal Medicine

## 2023-12-14 DIAGNOSIS — R079 Chest pain, unspecified: Secondary | ICD-10-CM | POA: Diagnosis not present

## 2023-12-14 DIAGNOSIS — R55 Syncope and collapse: Secondary | ICD-10-CM | POA: Diagnosis not present

## 2023-12-14 DIAGNOSIS — R600 Localized edema: Secondary | ICD-10-CM | POA: Diagnosis not present

## 2023-12-14 DIAGNOSIS — I159 Secondary hypertension, unspecified: Secondary | ICD-10-CM | POA: Diagnosis not present

## 2023-12-14 DIAGNOSIS — E66813 Obesity, class 3: Secondary | ICD-10-CM | POA: Diagnosis not present

## 2023-12-14 DIAGNOSIS — I10 Essential (primary) hypertension: Secondary | ICD-10-CM

## 2023-12-14 MED ORDER — COLCHICINE 0.6 MG PO TABS: 0.6000 mg | ORAL_TABLET | Freq: Two times a day (BID) | ORAL | 0 refills | Status: DC

## 2023-12-14 MED ORDER — GADOBUTROL 1 MMOL/ML IV SOLN
10.0000 mL | Freq: Once | INTRAVENOUS | Status: AC | PRN
Start: 2023-12-14 — End: 2023-12-14
  Administered 2023-12-14: 10 mL via INTRAVENOUS

## 2023-12-14 NOTE — Discharge Summary (Signed)
 Physician Discharge Summary   Gwendolyn Edwards FMW:[id number] DOB: 11-19-1975 DOA: 12/11/2023  PCP: Rosamond Leta NOVAK, MD  Admit date: 12/11/2023 Discharge date: 12/14/2023  Admitted From: Home  Disposition:  Home  Discharging physician: Alm Apo, MD Barriers to discharge: none  Recommendations at discharge: Follow up with cardiology If BP still elevated, could consider Bidil or Imdur (amlodipine  was discontinued to see if LE edema improved) Encourage weight loss/lifestyle changes. Candidate for GLP-1 agonists?   Discharge Condition: stable CODE STATUS: Full  Diet recommendation:  Diet Orders (From admission, onward)     Start     Ordered   12/14/23 0000  Diet - low sodium heart healthy        12/14/23 1415   12/12/23 0101  Diet Heart Room service appropriate? Yes; Fluid consistency: Thin  Diet effective now       Question Answer Comment  Room service appropriate? Yes   Fluid consistency: Thin      12/12/23 0102            Hospital Course: Gwendolyn Edwards is a 48 y.o. female with hx of recurrent pericarditis, mild non-obs CAD, HTN, GERD, obesity who presented to [facility] originally with recurrent episode of chest pain and recent syncopal episode.  Reported that last Friday on 6/13 she developed chest pain which was sharp and radiating to her back between her shoulders.  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  However ultimately still had a syncopal episode unknown how long she was unconscious for.   Pain had resolved later in the day after this episode.  However she then developed recurrent episode of chest pain radiating to her back so sought care.  Otherwise reports recent dyspnea on exertion and mild swelling in her legs.  No exertional chest pain.  Denies any preceding viral type illness, fever, chills, cough/cold, myalgias, GI symptoms, rashes.  She underwent workup  which revealed diffuse T wave inversions on EKG. CT angio chest was unremarkable as well as CXR.  She was evaluated by cardiology at [facility] and recommended to undergo cardiac MRI to further workup chest pain. She was transferred to [name] [name] to facilitate this. Cardiac MRI was performed on 12/14/2023.  Negative for any evidence of pericarditis or inflammation.  Ejection fraction normal, 54%. She was recommended to continue on colchicine  for 1 month at discharge.  Assessment and Plan: * Chest pain - hx pericarditis, u/k etiology  - Last episode appears to be December 2024 and evaluated with coronary CT.  CCS 0.  Minimal nonobstructive CAD per report -Now presenting again with chest pain after syncope at home.  EKG showing diffuse T wave inversions.  CT angio chest and CXR unremarkable - Transferred from Gwendolyn Edwards to Gwendolyn Edwards for cardiac MRI - Cardiology following, appreciate assistance - Cardiac MRI negative for pericarditis or inflammation; EF normal, 54% - 1 month colchicine  prescribed at discharge - other possible differential includes subendocardial angina? - trial of imdur at follow up? Discontinued her off amlodipine  at discharge  Syncope-resolved as of 12/13/2023 Hx episode of syncope on 6/14, occurred while supine, associated with episode of chest pain. EKG demonstrates new LBBB. HS trop is negative. Suspect either vagal secondary to pain or cardiac considering occurred while supine + associated with pericarditis.  - Cardiac MRI pending - Recommended for loop recorder per Dr. Mallipeddi.  - last echo Dec 2024; noted  with normal EF 55-60%, no RWMA, normal diastology - follow up cardiac MRI - Continue telemetry  Bilateral lower extremity edema - No heart failure noted on echo in December.  EF still normal on cardiac MRI - Some probable contribution from body habitus - Amlodipine  discontinued at discharge.  Outpatient follow-up for discussion of alternatives if needed  HTN  (hypertension) - On amlodipine , losartan -HCTZ, Lasix  (PRN), Toprol  at home - d/c'd amlodipine  at discharge  - If still needing further blood pressure control and having ongoing chest pains at follow up, could consider BiDil or Imdur - she also would benefit from any amount of weight loss at this point too   History of migraine - Continue Topamax   CAD (coronary artery disease) - Minimal nonobstructive CAD noted on CT coronary in December 2024  Obesity, Class III, BMI 40-49.9 (morbid obesity) - Complicates overall prognosis and care - Body mass index is 49.38 kg/m.  Anxiety - Continue BuSpar , Effexor   Acid reflux - Continue Protonix    The patient's acute and chronic medical conditions were treated accordingly. On day of discharge, patient was felt deemed stable for discharge. Patient/family member advised to call PCP or come back to ER if needed.   Principal Diagnosis: Chest pain  Discharge Diagnoses: Active Hospital Problems   Diagnosis Date Noted   Chest pain 12/12/2023    Priority: 1.   Bilateral lower extremity edema 12/13/2023    Priority: 3.   HTN (hypertension)     Priority: 4.   CAD (coronary artery disease) 12/13/2023   History of migraine 12/13/2023   Obesity, Class III, BMI 40-49.9 (morbid obesity) 06/07/2023   Anxiety    Acid reflux     Resolved Hospital Problems   Diagnosis Date Noted Date Resolved   Syncope 12/12/2023 12/13/2023    Priority: 2.     Discharge Instructions     Diet - low sodium heart healthy   Complete by: As directed    Increase activity slowly   Complete by: As directed       Allergies as of 12/14/2023       Reactions   Penicillins Swelling   Sulfa  Antibiotics Itching   Augmentin [amoxicillin-pot Clavulanate] Swelling, Rash   Tongue swells    Doxycycline  Hives, Itching        Medication List     STOP taking these medications    amLODipine  5 MG tablet Commonly known as: NORVASC    furosemide  20 MG tablet Commonly  known as: Lasix    metoprolol  succinate 100 MG 24 hr tablet Commonly known as: TOPROL -XL       TAKE these medications    BIOTIN PO Take 1 tablet by mouth daily.   busPIRone  15 MG tablet Commonly known as: BUSPAR  Take 15 mg by mouth 2 (two) times daily.   colchicine  0.6 MG tablet Take 1 tablet (0.6 mg total) by mouth 2 (two) times daily.   gabapentin  300 MG capsule Commonly known as: NEURONTIN  1 capsule (300 mg total) every morning AND 2 capsules (600 mg total) at bedtime.   losartan -hydrochlorothiazide  100-25 MG tablet Commonly known as: HYZAAR  Take 1 tablet by mouth every morning.   MAGNESIUM  SULFATE PO Take 1 tablet by mouth daily.   MULTIVITAMIN PO Take 1 tablet by mouth daily.   naproxen  sodium 220 MG tablet Commonly known as: ALEVE  Take 440 mg by mouth daily as needed (pain).   ondansetron  4 MG disintegrating tablet Commonly known as: ZOFRAN -ODT Take 4 mg by mouth every 8 (eight) hours as  needed for nausea or vomiting.   pantoprazole  40 MG tablet Commonly known as: PROTONIX  Take 1 tablet by mouth daily.   rizatriptan  10 MG disintegrating tablet Commonly known as: MAXALT -MLT Dissolve 1 tablet (10 mg total) by mouth as needed for migraine. May repeat in 2 hours if needed   topiramate  25 MG tablet Commonly known as: TOPAMAX  Take 25 mg by mouth 2 (two) times daily.   venlafaxine  XR 75 MG 24 hr capsule Commonly known as: EFFEXOR -XR Take 1 capsule by mouth daily (in addition to 150 mg dose).   vitamin B-12 100 MCG tablet Commonly known as: CYANOCOBALAMIN Take 100 mcg by mouth daily.        Follow-up Information     Vyas, Dhruv B, MD. Schedule an appointment as soon as possible for a visit in 2 week(s).   Specialty: Internal Medicine Contact information: [address] [place] [address] [phone]                Allergies  Allergen Reactions   Penicillins Swelling   Sulfa  Antibiotics Itching   Augmentin [Amoxicillin-Pot Clavulanate]  Swelling and Rash    Tongue swells    Doxycycline  Hives and Itching    Consultations: Cardiology  Procedures:   Discharge Exam: BP 138/77   Pulse 72   Temp 98.1 F (36.7 C) (Oral)   Resp 20   Ht 5' 2 (1.575 m)   Wt 122.5 kg   LMP 11/29/2023 (Approximate)   SpO2 98%   BMI 49.38 kg/m  Physical Exam Constitutional:      General: She is not in acute distress.    Appearance: Normal appearance. She is obese. She is not ill-appearing.  HENT:     Head: Normocephalic and atraumatic.     Mouth/Throat:     Mouth: Mucous membranes are moist.   Eyes:     Extraocular Movements: Extraocular movements intact.    Cardiovascular:     Rate and Rhythm: Normal rate and regular rhythm.  Pulmonary:     Effort: Pulmonary effort is normal. No respiratory distress.     Breath sounds: Normal breath sounds. No wheezing.  Abdominal:     General: Bowel sounds are normal. There is no distension.     Palpations: Abdomen is soft.     Tenderness: There is no abdominal tenderness.   Musculoskeletal:        General: Normal range of motion.     Cervical back: Normal range of motion and neck supple.     Right lower leg: Edema present.     Left lower leg: Edema present.   Skin:    General: Skin is warm and dry.   Neurological:     General: No focal deficit present.     Mental Status: She is alert.   Psychiatric:        Mood and Affect: Mood normal.      The results of significant diagnostics from this hospitalization (including imaging, microbiology, ancillary and laboratory) are listed below for reference.   Microbiology: No results found for this or any previous visit (from the past 240 hours).   Labs: BNP (last 3 results) No results for input(s): BNP in the last 8760 hours. Basic Metabolic Panel: Recent Labs  Lab 12/11/23 1405 12/12/23 0705  NA 136 139  K 3.9 3.9  CL 104 107  CO2 21* 24  GLUCOSE 119* 139*  BUN 9 11  CREATININE 0.91 0.79  CALCIUM 8.8* 8.3*  MG  --  2.1  PHOS  --  3.9   Liver Function Tests: No results for input(s): AST, ALT, ALKPHOS, BILITOT, PROT, ALBUMIN in the last 168 hours. No results for input(s): LIPASE, AMYLASE in the last 168 hours. No results for input(s): AMMONIA in the last 168 hours. CBC: Recent Labs  Lab 12/11/23 1405 12/12/23 0705  WBC 9.6 7.5  HGB 12.6 12.0  HCT 38.8 39.2  MCV 80.8 81.8  PLT 395 339   Cardiac Enzymes: No results for input(s): CKTOTAL, CKMB, CKMBINDEX, TROPONINI in the last 168 hours. BNP: Invalid input(s): POCBNP CBG: No results for input(s): GLUCAP in the last 168 hours. D-Dimer Recent Labs    12/11/23 1601  DDIMER 0.63*   Hgb A1c No results for input(s): HGBA1C in the last 72 hours. Lipid Profile No results for input(s): CHOL, HDL, LDLCALC, TRIG, CHOLHDL, LDLDIRECT in the last 72 hours. Thyroid function studies No results for input(s): TSH, T4TOTAL, T3FREE, THYROIDAB in the last 72 hours.  Invalid input(s): FREET3 Anemia work up No results for input(s): VITAMINB12, FOLATE, FERRITIN, TIBC, IRON, RETICCTPCT in the last 72 hours. Urinalysis    Component Value Date/Time   COLORURINE YELLOW 03/06/2018 0023   APPEARANCEUR HAZY (A) 03/06/2018 0023   LABSPEC 1.019 03/06/2018 0023   PHURINE 5.0 03/06/2018 0023   GLUCOSEU NEGATIVE 03/06/2018 0023   HGBUR NEGATIVE 03/06/2018 0023   BILIRUBINUR NEGATIVE 03/06/2018 0023   KETONESUR NEGATIVE 03/06/2018 0023   PROTEINUR NEGATIVE 03/06/2018 0023   UROBILINOGEN 0.2 12/30/2014 1635   NITRITE NEGATIVE 03/06/2018 0023   LEUKOCYTESUR SMALL (A) 03/06/2018 0023   Sepsis Labs Recent Labs  Lab 12/11/23 1405 12/12/23 0705  WBC 9.6 7.5   Microbiology No results found for this or any previous visit (from the past 240 hours).  Procedures/Studies: MR CARDIAC MORPHOLOGY W WO CONTRAST Result Date: 12/14/2023 CLINICAL DATA:  14F with history of pericarditis p/w chest pain  EXAM: CARDIAC MRI TECHNIQUE: The patient was scanned on a 1.5 Tesla Siemens magnet. A dedicated cardiac coil was used. Functional imaging was done using Fiesta sequences. 2,3, and 4 chamber views were done to assess for RWMA's. Modified Simpson's rule using a short axis stack was used to calculate an ejection fraction on a dedicated work [employer]. The patient received 10 cc of Gadavist. After 10 minutes inversion recovery sequences were used to assess for infiltration and scar tissue. Phase contrast velocity mapping was performed above the aortic and pulmonic valves CONTRAST:  10 cc  of Gadavist FINDINGS: Left ventricle: -Normal size -No hypertrophy -Normal systolic function -Mild elevation in ECV (39%) -Normal T2 values -No LGE LV EF:  55% (Normal 52-79%) Absolute volumes: LV EDV: (Normal 78-167 mL) LV ESV: 83mL (Normal 21-64 mL) LV SV: (Normal 52-114 mL) CO: 5.9L/min (Normal 2.7-6.3 L/min) Indexed volumes: LV EDV: 34mL/sq-m (Normal 50-96 mL/sq-m) LV ESV: 91mL/sq-m (Normal 10-40 mL/sq-m) LV SV: 45mL/sq-m (Normal 33-64 mL/sq-m) CI: 2.5L/min/sq-m (Normal 1.9-3.9 L/min/sq-m) Right ventricle: Normal size and systolic function RV EF: 54% (Normal 52-80%) Absolute volumes: RV EDV: (Normal 79-175 mL) RV ESV: 89mL (Normal 13-75 mL) RV SV: (Normal 56-110 mL) CO: 6.2L/min (Normal 2.7-6 L/min) Indexed volumes: RV EDV: 71mL/sq-m (Normal 51-97 mL/sq-m) RV ESV: 69mL/sq-m (Normal 9-42 mL/sq-m) RV SV: 48mL/sq-m (Normal 35-61 mL/sq-m) CI: 2.7L/min/sq-m (Normal 1.8-3.8 L/min/sq-m) Left atrium: Mild enlargement Right atrium: Normal size Mitral valve: No regurgitation Aortic valve: Tricuspid. Trivial regurgitation Tricuspid valve: Trivial regurgitation Pulmonic valve: No regurgitation Aorta: Normal proximal ascending aorta Pericardium: No hyperenhancement of  pericardium on T2 STIR or LGE imaging to suggest pericarditis IMPRESSION: 1. No hyperenhancement of pericardium on T2 STIR or LGE  imaging to suggest pericarditis 2. Normal LV size, wall thickness, and systolic function (EF 55%). No LGE to suggest myocardial scar 3.  Normal RV size and systolic function (EF 54%) Electronically Signed   By: Lonni Nanas M.D.   On: 12/14/2023 13:21   MR CARDIAC VELOCITY FLOW MAP Result Date: 12/14/2023 CLINICAL DATA:  68F with history of pericarditis p/w chest pain EXAM: CARDIAC MRI TECHNIQUE: The patient was scanned on a 1.5 Tesla Siemens magnet. A dedicated cardiac coil was used. Functional imaging was done using Fiesta sequences. 2,3, and 4 chamber views were done to assess for RWMA's. Modified Simpson's rule using a short axis stack was used to calculate an ejection fraction on a dedicated work [employer], [employer]. The patient received 10 cc of Gadavist. After 10 minutes inversion recovery sequences were used to assess for infiltration and scar tissue. Phase contrast velocity mapping was performed above the aortic and pulmonic valves CONTRAST:  10 cc  of Gadavist FINDINGS: Left ventricle: -Normal size -No hypertrophy -Normal systolic function -Mild elevation in ECV (39%) -Normal T2 values -No LGE LV EF:  55% (Normal 52-79%) Absolute volumes: LV EDV: (Normal 78-167 mL) LV ESV: 83mL (Normal 21-64 mL) LV SV: (Normal 52-114 mL) CO: 5.9L/min (Normal 2.7-6.3 L/min) Indexed volumes: LV EDV: 48mL/sq-m (Normal 50-96 mL/sq-m) LV ESV: 33mL/sq-m (Normal 10-40 mL/sq-m) LV SV: 2mL/sq-m (Normal 33-64 mL/sq-m) CI: 2.5L/min/sq-m (Normal 1.9-3.9 L/min/sq-m) Right ventricle: Normal size and systolic function RV EF: 54% (Normal 52-80%) Absolute volumes: RV EDV: (Normal 79-175 mL) RV ESV: 89mL (Normal 13-75 mL) RV SV: (Normal 56-110 mL) CO: 6.2L/min (Normal 2.7-6 L/min) Indexed volumes: RV EDV: 81mL/sq-m (Normal 51-97 mL/sq-m) RV ESV: 7mL/sq-m (Normal 9-42 mL/sq-m) RV SV: 61mL/sq-m (Normal 35-61 mL/sq-m) CI: 2.7L/min/sq-m (Normal 1.8-3.8 L/min/sq-m) Left atrium: Mild enlargement  Right atrium: Normal size Mitral valve: No regurgitation Aortic valve: Tricuspid. Trivial regurgitation Tricuspid valve: Trivial regurgitation Pulmonic valve: No regurgitation Aorta: Normal proximal ascending aorta Pericardium: No hyperenhancement of pericardium on T2 STIR or LGE imaging to suggest pericarditis IMPRESSION: 1. No hyperenhancement of pericardium on T2 STIR or LGE imaging to suggest pericarditis 2. Normal LV size, wall thickness, and systolic function (EF 55%). No LGE to suggest myocardial scar 3.  Normal RV size and systolic function (EF 54%) Electronically Signed   By: Lonni Nanas M.D.   On: 12/14/2023 13:21   MR CARDIAC VELOCITY FLOW MAP Result Date: 12/14/2023 CLINICAL DATA:  68F with history of pericarditis p/w chest pain EXAM: CARDIAC MRI TECHNIQUE: The patient was scanned on a 1.5 Tesla Siemens magnet. A dedicated cardiac coil was used. Functional imaging was done using Fiesta sequences. 2,3, and 4 chamber views were done to assess for RWMA's. Modified Simpson's rule using a short axis stack was used to calculate an ejection fraction on a dedicated work [employer], [employer]. The patient received 10 cc of Gadavist. After 10 minutes inversion recovery sequences were used to assess for infiltration and scar tissue. Phase contrast velocity mapping was performed above the aortic and pulmonic valves CONTRAST:  10 cc  of Gadavist FINDINGS: Left ventricle: -Normal size -No hypertrophy -Normal systolic function -Mild elevation in ECV (39%) -Normal T2 values -No LGE LV EF:  55% (Normal 52-79%) Absolute volumes: LV EDV: (Normal 78-167 mL) LV ESV: 83mL (Normal 21-64 mL) LV SV: (Normal 52-114 mL) CO: 5.9L/min (  Normal 2.7-6.3 L/min) Indexed volumes: LV EDV: 22mL/sq-m (Normal 50-96 mL/sq-m) LV ESV: 20mL/sq-m (Normal 10-40 mL/sq-m) LV SV: 56mL/sq-m (Normal 33-64 mL/sq-m) CI: 2.5L/min/sq-m (Normal 1.9-3.9 L/min/sq-m) Right ventricle: Normal size and systolic function RV EF: 54%  (Normal 52-80%) Absolute volumes: RV EDV: (Normal 79-175 mL) RV ESV: 89mL (Normal 13-75 mL) RV SV: (Normal 56-110 mL) CO: 6.2L/min (Normal 2.7-6 L/min) Indexed volumes: RV EDV: 34mL/sq-m (Normal 51-97 mL/sq-m) RV ESV: 9mL/sq-m (Normal 9-42 mL/sq-m) RV SV: 76mL/sq-m (Normal 35-61 mL/sq-m) CI: 2.7L/min/sq-m (Normal 1.8-3.8 L/min/sq-m) Left atrium: Mild enlargement Right atrium: Normal size Mitral valve: No regurgitation Aortic valve: Tricuspid. Trivial regurgitation Tricuspid valve: Trivial regurgitation Pulmonic valve: No regurgitation Aorta: Normal proximal ascending aorta Pericardium: No hyperenhancement of pericardium on T2 STIR or LGE imaging to suggest pericarditis IMPRESSION: 1. No hyperenhancement of pericardium on T2 STIR or LGE imaging to suggest pericarditis 2. Normal LV size, wall thickness, and systolic function (EF 55%). No LGE to suggest myocardial scar 3.  Normal RV size and systolic function (EF 54%) Electronically Signed   By: Lonni Nanas M.D.   On: 12/14/2023 13:21   CT Angio Chest PE W and/or Wo Contrast Result Date: 12/11/2023 CLINICAL DATA:  High probability pulmonary embolism EXAM: CT ANGIOGRAPHY CHEST WITH CONTRAST TECHNIQUE: Multidetector CT imaging of the chest was performed using the standard protocol during bolus administration of intravenous contrast. Multiplanar CT image reconstructions and MIPs were obtained to evaluate the vascular anatomy. RADIATION DOSE REDUCTION: This exam was performed according to the departmental dose-optimization program which includes automated exposure control, adjustment of the mA and/or kV according to patient size and/or use of iterative reconstruction technique. CONTRAST:  75mL OMNIPAQUE  IOHEXOL  350 MG/ML SOLN COMPARISON:  None Available. FINDINGS: Cardiovascular: No filling defects within the pulmonary arteries to suggest acute pulmonary embolism. Mediastinum/Nodes: No axillary or supraclavicular adenopathy. No mediastinal or  hilar adenopathy. No pericardial fluid. Esophagus normal. Lungs/Pleura: No pulmonary infarction. No pneumonia. No pleural fluid. No pneumothorax Upper Abdomen: Limited view of the liver, kidneys, pancreas are unremarkable. Normal adrenal glands. Musculoskeletal: No aggressive osseous lesion. Review of the MIP images confirms the above findings. IMPRESSION: 1. No evidence acute pulmonary embolism. 2. No acute pulmonary findings. Electronically Signed   By: Jackquline Boxer M.D.   On: 12/11/2023 18:52   DG Chest 2 View Result Date: 12/11/2023 CLINICAL DATA:  Chest pain. Intermittent since 7 days ago. One episode of emesis 7 days ago. EXAM: CHEST - 2 VIEW COMPARISON:  Chest radiographs 06/05/2023, 11/16/2019 FINDINGS: Cardiac silhouette and mediastinal contours are within normal limits. The lungs are clear. No pleural effusion or pneumothorax. Moderate multilevel bridging osteophytes of the thoracic spine, similar to prior. IMPRESSION: No active cardiopulmonary disease. Electronically Signed   By: Tanda Lyons M.D.   On: 12/11/2023 15:55     Time coordinating discharge: Over 30 minutes    Alm Apo, MD  [facility] 12/14/2023, 3:45 PM

## 2023-12-14 NOTE — TOC CM/SW Note (Signed)
 Transition of Care [facility]) - Inpatient Brief Assessment   Patient Details  Name: LAPORCHIA NAKAJIMA MRN: [id number] Date of Birth: 03-19-1976  [facility]) CM/SW Contact:    Lauraine FORBES Saa, LCSW Phone Number: 12/14/2023, 9:41 AM   Clinical Narrative:  9:41 AM Per chart review, patient resides at home with spouse and child(ren). Patient has a PCP and insurance. Patient does not have SNF/HH/DME history. Patient's preferred pharmacy is [facility] [address]. No TOC needs were identified at this time. TOC will continue to follow and be available to assist.  Transition of Care Asessment: Insurance and Status: Insurance coverage has been reviewed Patient has primary care physician: Yes Home environment has been reviewed: Private Residence Prior level of function:: N/A Prior/Current Home Services: No current home services Social Drivers of Health Review: SDOH reviewed no interventions necessary Readmission risk has been reviewed: Yes Transition of care needs: no transition of care needs at this time

## 2023-12-14 NOTE — Progress Notes (Signed)
 AVS given and reviewed with pt. Medications discussed. All questions answered to satisfaction. Work note provided to pt. Pt verbalized understanding of information given. Pt escorted off the unit with all belongings via wheelchair by staff member.

## 2023-12-14 NOTE — Progress Notes (Addendum)
 Rounding Note   Patient Name: Gwendolyn Edwards Date of Encounter: 12/14/2023  [facility] Cardiologist: Jayson Sierras, MD   Subjective She states she has been having constant pain that she describes as a pressure in her chest, neck, and shoulder since last Friday 6/20. She currently ranks the pain as a 7-8/10 on the pain but looks comfortably. She states it feel like her prior pericarditis but more severe. However, the description of her pain does not sound like pericarditis - she states it is worse with ambulation. Not worse when laying down (in fact, laying down on a firm surface helps) and not pleuritic in nature. She also notes some dyspnea when ambulation.  She just got back from cardiac MRI. Results pending.  Scheduled Meds:  busPIRone   15 mg Oral BID   colchicine   0.6 mg Oral BID   enoxaparin  (LOVENOX ) injection  60 mg Subcutaneous Q24H   gabapentin   300 mg Oral TID   ibuprofen   800 mg Oral Q8H   losartan   100 mg Oral Daily   pantoprazole   40 mg Oral Daily   sodium chloride  flush  3 mL Intravenous Q12H   topiramate   25 mg Oral BID   venlafaxine  XR  75 mg Oral Q breakfast   Continuous Infusions:  PRN Meds: acetaminophen , albuterol , bisacodyl, lidocaine , melatonin, morphine  injection, ondansetron  (ZOFRAN ) IV, oxyCODONE  **OR** oxyCODONE , polyethylene glycol   Vital Signs  Vitals:   12/13/23 1802 12/13/23 1955 12/13/23 2322 12/14/23 0312  BP: (!) 110/49 125/65 105/63 123/64  Pulse: 74 69 66 63  Resp: 18 18 18    Temp: 97.8 F (36.6 C) 98.2 F (36.8 C) 97.8 F (36.6 C) 97.8 F (36.6 C)  TempSrc: Oral Oral Oral Oral  SpO2: 100% 100% 99% 98%  Weight:      Height:        Intake/Output Summary (Last 24 hours) at 12/14/2023 0740 Last data filed at 12/13/2023 2100 Gross per 24 hour  Intake 480 ml  Output --  Net 480 ml      12/11/2023    1:59 PM 06/05/2023    9:36 PM 06/05/2023   11:17 AM  Last 3 Weights  Weight (lbs) 270 lb 279 lb 12.8 oz 260  lb  Weight (kg) 122.471 kg 126.916 kg 117.935 kg      Telemetry Normal sinus rhythm with rates in the 60s to 90s. - Personally Reviewed  ECG  No new ECG tracing today. - Personally Reviewed  Physical Exam  GEN: Obese African-American female. No acute distress.   Neck: No JVD. Cardiac: RRR. No murmurs, rubs, or gallops.  Respiratory: Clear to auscultation bilaterally. No wheezes, rhonchi, or rales. MS: No lower extremity edema. No deformity. Neuro:  No focal deficits.  Psych: Normal affect. Responds appropriately.   Labs High Sensitivity Troponin:   Recent Labs  Lab 12/11/23 1405 12/11/23 1601  TROPONINIHS 3 3     Chemistry Recent Labs  Lab 12/11/23 1405 12/12/23 0705  NA 136 139  K 3.9 3.9  CL 104 107  CO2 21* 24  GLUCOSE 119* 139*  BUN 9 11  CREATININE 0.91 0.79  CALCIUM 8.8* 8.3*  MG  --  2.1  GFRNONAA >60 >60  ANIONGAP 11 8    Lipids No results for input(s): CHOL, TRIG, HDL, LABVLDL, LDLCALC, CHOLHDL in the last 168 hours.  Hematology Recent Labs  Lab 12/11/23 1405 12/12/23 0705  WBC 9.6 7.5  RBC 4.80 4.79  HGB 12.6 12.0  HCT 38.8 39.2  MCV 80.8 81.8  MCH 26.3 25.1*  MCHC 32.5 30.6  RDW 16.2* 16.2*  PLT 395 339   Thyroid No results for input(s): TSH, FREET4 in the last 168 hours.  BNPNo results for input(s): BNP, PROBNP in the last 168 hours.  DDimer  Recent Labs  Lab 12/11/23 1601  DDIMER 0.63*     Radiology  No results found.  Cardiac Studies  Monitor in 06/2022: Predominant rhythm is sinus with heart rate ranging from 54 bpm up to 140 bpm and average heart rate 84 bpm.  Intermittent IVCD noted. There were rare PACs and PVCs, both representing less than 1% total beats. No sustained arrhythmias or pauses. _______________  Echocardiogram 06/06/2023: Impressions:  1. Left ventricular ejection fraction, by estimation, is 55 to 60%. The  left ventricle has normal function. The left ventricle has no regional  wall  motion abnormalities. Left ventricular diastolic parameters were  normal.   2. Right ventricular systolic function is normal. The right ventricular  size is normal.   3. The mitral valve is normal in structure. No evidence of mitral valve  regurgitation. No evidence of mitral stenosis.   4. The aortic valve is tricuspid. Aortic valve regurgitation is not  visualized. No aortic stenosis is present.   5. The inferior vena cava is dilated in size with <50% respiratory  variability, suggesting right atrial pressure of 15 mmHg.  _______________  Coronary CTA 06/08/2023: Impressions: 1. Coronary calcium score of 0 . This was 1st percentile for age-, sex, and race-matched controls. 2. Total plaque volume (TPV) 7 mm3 which is 47th percentile for age- and sex-matched controls (calcified plaque 0 mm3; non-calcified plaque 7 mm3 (low attenuation plaque 0 mm3)). TPV is mild. 3. Normal coronary origin with right dominance. 4. CAD-RADS = 1 Minimal non-obstructive CAD.   Patient Profile   48 y.o. female with a history of minimal non-obstructive CAD on cardiac catheterization in 05/2023 with a coronary calcium score of 0, recurrent idiopathic pericarditis, palpitations felt to be due to PVCs, hypertension, GERD, and IBS who presented to [facility] on 12/11/2023 for further evaluation of chest pain and syncope. She was transferred to [facility] for cardiac MRI for further evaluation of recurrent pericarditis.   Assessment & Plan   Chest Pain Minimal Non-Obstructive CAD Recurrent Pericarditis Patient has a history of recurrent pericarditis. Prior coronary CTA in 05/2023 showed a coronary calcium score of 0 with only minimal CAD. She was previously prescribed Rilonacept but it is unclear if she every started this. She now presented with intermittent chest heaviness radiating to her neck and shoulders. High-sensitivity troponin negative x2. D-dimer elevated at 0.63 but chest CTA negative for PE.  CRP elevated at 1.2 but Sed Rate negative. Last Echo in 05/2023 shwoed LVEF of 55-60% with normal wall motion. She was transferred to [facility] for cardiac MRI. - She states she has been having constant chest pain for the past 3 days. Worse with exertion. Not pleuritic or worse when laying down. - Continue Colchicine  0.6mg  twice daily. - ANA pending. - Symptoms don't sound like pericarditis; however, she does state it feels similar to when she has had pericarditis in the past just more severe. Given normal coronary CTA 6 months ago, do not think this is due to progressive CAD. Cardiac MRI was completed this morning. Will wait for results. Consider repeat limited Echo as well to assess for pericardial effusion. Will discuss with MD.  Syncope Patient has a history of recurrent syncope. Per  chart review, she does have a history of NSVT during prior pericarditis/ myocarditis. Prior Monitor in 06/2022 showed rare PACs/ PVCs but no significant arrhythmias. She had one syncopal episode last week. She denied any lightheadedness/ dizziness prior to this event but reported diffuse pain.  - No significant arrhythmias noted on telemetry. - Dr. Stacia recommended loop recorder given sporadic syncopal episodes and intermittent LBBB. Will discuss timing of this with MD (inpatient vs outpatient).  Hypertension BP mostly well controlled.  - Continue Losartan  100mg  daily.  - Of note, she is actually on Losartan -HCTZ at home. She reports some lower extremity edema recently but no current edema on exam. Can resume Losartan -HCTZ at discharge.   For questions or updates, please contact [facility] Please consult [email or web address] for contact info under     Signed, Callie E Goodrich, PA-C  12/14/2023, 7:40 AM    Personally seen and examined. Agree with above.  Thankfully, cardiac MRI does not show any evidence of pericarditis or pericardial inflammation. Normal ejection fraction 54%, no scar.  This  would be compatible with noncardiac chest pain. Okay to continue with NSAIDs, colchicine  0.6 mg twice a day as an anti-inflammatory for noncardiac pain Okay for discharge. I know she is frustrated with her symptomatology however thankfully there does not appear to be any damage/inflammation present cardiac.   Oneil Parchment, MD

## 2023-12-14 NOTE — Plan of Care (Signed)
  Problem: Pain Managment: Goal: General experience of comfort will improve and/or be controlled Outcome: Progressing   Problem: Safety: Goal: Ability to remain free from injury will improve Outcome: Progressing   Problem: Skin Integrity: Goal: Risk for impaired skin integrity will decrease Outcome: Progressing

## 2023-12-14 NOTE — TOC Progression Note (Signed)
 Transition of Care [facility]) - Progression Note    Patient Details  Name: Gwendolyn Edwards MRN: [id number] Date of Birth: 20-Jul-1975  [facility]) CM/SW Contact  Robynn Eileen Hoose, RN Phone Number: 12/14/2023, 11:12 AM  Clinical Narrative:   Progression rounds update, Cardiac MRI today, possible discharge home tomorrow.         Expected Discharge Plan and Services                                               Social Determinants of Health (SDOH) Interventions SDOH Screenings   Food Insecurity: No Food Insecurity (12/13/2023)  Housing: Low Risk  (12/13/2023)  Transportation Needs: No Transportation Needs (12/13/2023)  Utilities: Not At Risk (12/13/2023)  Alcohol Screen: Low Risk  (12/17/2020)  Depression (PHQ2-9): Low Risk  (12/17/2020)  Financial Resource Strain: Low Risk  (12/17/2020)  Physical Activity: Insufficiently Active (12/17/2020)  Social Connections: Unknown (10/13/2022)   Received from [facility]  Stress: No Stress Concern Present (12/17/2020)  Tobacco Use: Low Risk  (12/11/2023)    Readmission Risk Interventions     No data to display

## 2023-12-17 LAB — FANA STAINING PATTERNS: Homogeneous Pattern: 1 — ABNORMAL HIGH

## 2023-12-17 LAB — ANTINUCLEAR ANTIBODIES, IFA: ANA Ab, IFA: POSITIVE — AB

## 2024-03-11 ENCOUNTER — Other Ambulatory Visit: Payer: Self-pay | Admitting: Cardiology

## 2024-04-15 ENCOUNTER — Ambulatory Visit (INDEPENDENT_AMBULATORY_CARE_PROVIDER_SITE_OTHER)

## 2024-04-15 ENCOUNTER — Ambulatory Visit
Admission: RE | Admit: 2024-04-15 | Discharge: 2024-04-15 | Disposition: A | Discharge status: Home / Self Care | Source: Ambulatory Visit | Attending: Nurse Practitioner | Admitting: Nurse Practitioner

## 2024-04-15 VITALS — BP 148/84 | HR 87 | Temp 98.9°F | Resp 18

## 2024-04-15 DIAGNOSIS — M542 Cervicalgia: Secondary | ICD-10-CM | POA: Diagnosis not present

## 2024-04-15 DIAGNOSIS — R0602 Shortness of breath: Secondary | ICD-10-CM | POA: Diagnosis not present

## 2024-04-15 DIAGNOSIS — R509 Fever, unspecified: Secondary | ICD-10-CM | POA: Diagnosis not present

## 2024-04-15 DIAGNOSIS — R059 Cough, unspecified: Secondary | ICD-10-CM

## 2024-04-15 DIAGNOSIS — M25519 Pain in unspecified shoulder: Secondary | ICD-10-CM

## 2024-04-15 MED ORDER — ALBUTEROL SULFATE HFA 108 (90 BASE) MCG/ACT IN AERS: 2.0000 | INHALATION_SPRAY | Freq: Four times a day (QID) | RESPIRATORY_TRACT | 0 refills | Status: DC | PRN

## 2024-04-15 MED ORDER — CEFDINIR 300 MG PO CAPS: 300.0000 mg | ORAL_CAPSULE | Freq: Two times a day (BID) | ORAL | 0 refills | Status: AC

## 2024-04-15 MED ORDER — FLUCONAZOLE 150 MG PO TABS: 150.0000 mg | ORAL_TABLET | Freq: Once | ORAL | 0 refills | Status: AC

## 2024-04-15 MED ORDER — PROMETHAZINE-DM 6.25-15 MG/5ML PO SYRP: 5.0000 mL | ORAL_SOLUTION | Freq: Four times a day (QID) | ORAL | 0 refills | Status: DC | PRN

## 2024-04-15 NOTE — ED Provider Notes (Signed)
 [facility]    CSN: [id number] Arrival date & time: 04/15/24  1006      History   Chief Complaint Chief Complaint  Patient presents with   Fever    I have been having a fever ranging from 99-102* for the past 2 weeks with a non productive cough and neck tightness and shoulder tightness with congestion - Entered by patient    HPI Gwendolyn Edwards is a 48 y.o. female.   The history is provided by the patient.   Patient presents for for complaints of a 2-week history of cough, fever, shortness of breath, and neck and shoulder tightness.  Patient states that the cough has been nonproductive.  Tmax 102.  She states that she has also noticed herself wheezing, more at nighttime when she is lying down.  She denies headache, ear pain, nasal congestion, runny nose, sore throat, chest pain, abdominal pain, nausea, vomiting, or diarrhea.  Patient states she has been taking over-the-counter Tylenol  or ibuprofen , states that she took Robitussin on 1 or 2 occasions.  She denies history of asthma or smoking.  Past Medical History:  Diagnosis Date   Abscess of neck 2019   Acid reflux    Anxiety    Essential hypertension    GERD (gastroesophageal reflux disease)    Hemorrhoids    Hypertension    IBS (irritable bowel syndrome)    OA (osteoarthritis) of knee    Pericarditis    Possible pericarditis/myocarditis December 2020   Shingles 07/2020   recurrent   Snoring     Patient Active Problem List   Diagnosis Date Noted   CAD (coronary artery disease) 12/13/2023   Bilateral lower extremity edema 12/13/2023   History of migraine 12/13/2023   Chest pain 12/12/2023   Precordial pain 06/07/2023   Obesity, Class III, BMI 40-49.9 (morbid obesity) (HCC) 06/07/2023   Pericarditis, acute, idiopathic 06/05/2023   Patient desires pregnancy 12/17/2020   Encounter for screening fecal occult blood testing 12/17/2020   HTN (hypertension)    Acid reflux    Anxiety    Recurrent  idiopathic pericarditis 06/08/2019   Chest pain at rest 06/05/2019   Hypertensive heart disease 12/01/2018   Screening for colorectal cancer 12/01/2018   Encounter for gynecological examination with Papanicolaou smear of cervix 12/01/2018   Abscess of neck 02/08/2018    Past Surgical History:  Procedure Laterality Date   LEFT HEART CATH AND CORONARY ANGIOGRAPHY N/A 06/05/2019   Procedure: LEFT HEART CATH AND CORONARY ANGIOGRAPHY;  Surgeon: Wonda Sharper, MD;  Location[facility] INVASIVE CV LAB;  Service: Cardiovascular;  Laterality: N/A;   WISDOM TOOTH EXTRACTION      OB History     Gravida  5   Para  2   Term  2   Preterm      AB  3   Living  2      SAB      IAB      Ectopic      Multiple      Live Births  1            Home Medications    Prior to Admission medications   Medication Sig Start Date End Date Taking? Authorizing Provider  albuterol  (VENTOLIN  HFA) 108 (90 Base) MCG/ACT inhaler Inhale 2 puffs into the lungs every 6 (six) hours as needed. 04/15/24  Yes Leath-Warren, Etta PARAS, NP  cefdinir  (OMNICEF ) 300 MG capsule Take 1 capsule (300 mg total) by mouth 2 (two)  times daily for 5 days. 04/15/24 04/20/24 Yes Leath-Warren, Etta PARAS, NP  promethazine-dextromethorphan (PROMETHAZINE-DM) 6.25-15 MG/5ML syrup Take 5 mLs by mouth 4 (four) times daily as needed. 04/15/24  Yes Leath-Warren, Etta PARAS, NP  BIOTIN PO Take 1 tablet by mouth daily.    [provider]  busPIRone  (BUSPAR ) 15 MG tablet Take 15 mg by mouth 2 (two) times daily.    [provider]  colchicine  0.6 MG tablet Take 1 tablet (0.6 mg total) by mouth 2 (two) times daily. 12/14/23 01/13/24  Patsy Lenis, MD  gabapentin  (NEURONTIN ) 300 MG capsule 1 capsule (300 mg total) every morning AND 2 capsules (600 mg total) at bedtime. 04/17/22     losartan -hydrochlorothiazide  (HYZAAR ) 100-25 MG tablet Take 1 tablet by mouth every morning. 04/17/22     MAGNESIUM  SULFATE PO Take 1 tablet  by mouth daily.    [provider]  metoprolol  succinate (TOPROL -XL) 100 MG 24 hr tablet TAKE 1 TABLET BY MOUTH ONCE DAILY WITH OR IMMEDIATELY FOLLOWING A MEAL 03/11/24   Debera Jayson MATSU, MD  Multiple Vitamin (MULTIVITAMIN PO) Take 1 tablet by mouth daily.    [provider]  naproxen  sodium (ALEVE ) 220 MG tablet Take 440 mg by mouth daily as needed (pain).    [provider]  ondansetron  (ZOFRAN -ODT) 4 MG disintegrating tablet Take 4 mg by mouth every 8 (eight) hours as needed for nausea or vomiting. 08/27/23   [provider]  pantoprazole  (PROTONIX ) 40 MG tablet Take 1 tablet by mouth daily. 05/01/22   [provider]  rizatriptan  (MAXALT -MLT) 10 MG disintegrating tablet Dissolve 1 tablet (10 mg total) by mouth as needed for migraine. May repeat in 2 hours if needed 04/24/21   Penumalli, Vikram R, MD  topiramate  (TOPAMAX ) 25 MG tablet Take 25 mg by mouth 2 (two) times daily.    [provider]  venlafaxine  XR (EFFEXOR -XR) 75 MG 24 hr capsule Take 1 capsule by mouth daily (in addition to 150 mg dose). 07/12/21     vitamin B-12 (CYANOCOBALAMIN) 100 MCG tablet Take 100 mcg by mouth daily.    [provider]    Family History Family History  Problem Relation Age of Onset   Cancer Father        Osteosarcoma    Kidney disease Mother    Hepatitis C Mother    Hypertension Mother    Thyroid disease Sister     Social History Social History   Tobacco Use   Smoking status: Never   Smokeless tobacco: Never  Vaping Use   Vaping status: Never Used  Substance Use Topics   Alcohol use: No   Drug use: No     Allergies   Penicillins, Sulfa  antibiotics, Augmentin [amoxicillin-pot clavulanate], and Doxycycline    Review of Systems Review of Systems Per HPI  Physical Exam Triage Vital Signs ED Triage Vitals  Encounter Vitals Group     BP 04/15/24 1013 (!) 148/84     Girls Systolic BP Percentile --      Girls Diastolic BP  Percentile --      Boys Systolic BP Percentile --      Boys Diastolic BP Percentile --      Pulse Rate 04/15/24 1013 87     Resp 04/15/24 1013 18     Temp 04/15/24 1013 98.9 F (37.2 C)     Temp Source 04/15/24 1013 Oral     SpO2 04/15/24 1013 97 %     Weight --  Height --      Head Circumference --      Peak Flow --      Pain Score 04/15/24 1014 7     Pain Loc --      Pain Education --      Exclude from Growth Chart --    No data found.  Updated Vital Signs BP (!) 148/84 (BP Location: Right Arm)   Pulse 87   Temp 98.9 F (37.2 C) (Oral)   Resp 18   LMP 04/06/2024 (Approximate)   SpO2 97%   Visual Acuity Right Eye Distance:   Left Eye Distance:   Bilateral Distance:    Right Eye Near:   Left Eye Near:    Bilateral Near:     Physical Exam Vitals and nursing note reviewed.  Constitutional:      General: She is not in acute distress.    Appearance: Normal appearance.  HENT:     Head: Normocephalic.     Right Ear: Tympanic membrane, ear canal and external ear normal.     Left Ear: Tympanic membrane, ear canal and external ear normal.     Nose: Nose normal.     Right Turbinates: Enlarged and swollen.     Left Turbinates: Enlarged and swollen.     Mouth/Throat:     Lips: Pink.     Mouth: Mucous membranes are moist.     Pharynx: Oropharynx is clear. Uvula midline.  Eyes:     Extraocular Movements: Extraocular movements intact.     Conjunctiva/sclera: Conjunctivae normal.     Pupils: Pupils are equal, round, and reactive to light.  Cardiovascular:     Rate and Rhythm: Normal rate and regular rhythm.     Pulses: Normal pulses.     Heart sounds: Normal heart sounds.  Pulmonary:     Effort: Pulmonary effort is normal. No respiratory distress.     Breath sounds: Normal breath sounds. No stridor. No wheezing, rhonchi or rales.  Abdominal:     General: Bowel sounds are normal.     Palpations: Abdomen is soft.     Tenderness: There is no abdominal tenderness.   Musculoskeletal:     Cervical back: Normal range of motion.  Lymphadenopathy:     Cervical: No cervical adenopathy.  Skin:    General: Skin is warm and dry.  Neurological:     General: No focal deficit present.     Mental Status: She is alert and oriented to person, place, and time.  Psychiatric:        Mood and Affect: Mood normal.        Behavior: Behavior normal.      UC Treatments / Results  Labs (all labs ordered are listed, but only abnormal results are displayed) Labs Reviewed - No data to display  EKG   Radiology DG Chest 2 View Result Date: 04/15/2024 EXAM: 2 VIEW(S) XRAY OF THE CHEST 04/15/2024 10:24:19 AM COMPARISON: 12/11/2023 CLINICAL HISTORY: cough and fever x 2 weeks. Table formatting from the original note was not included.; Pt reports she has been having a cough, fever, neck and shoulder pressure x 2 weeks ; ; Took tylenol , robitussin, and motrin  FINDINGS: LUNGS AND PLEURA: No focal pulmonary opacity. No pulmonary edema. No pleural effusion. No pneumothorax. HEART AND MEDIASTINUM: No acute abnormality of the cardiac and mediastinal silhouettes. BONES AND SOFT TISSUES: Moderate thoracic spine multilevel disc space narrowing with mild-to-moderate anterior endplate osteophytes. IMPRESSION: 1. No acute cardiopulmonary findings. Electronically signed by:  Katheleen Faes MD 04/15/2024 10:34 AM EDT RP Workstation: [id number]    Procedures Procedures (including critical care time)  Medications Ordered in UC Medications - No data to display  Initial Impression / Assessment and Plan / UC Course  I have reviewed the triage vital signs and the nursing notes.  Pertinent labs & imaging results that were available during my care of the patient were reviewed by me and considered in my medical decision making (see chart for details).  On exam, the patient's lung sounds are clear throughout, room air sats at 97%.  Chest x-ray was negative for acute cardiopulmonary disease.   Will cover patient with cefdinir  300 mg for possible bacterial etiology.  Will also provide symptomatic treatment with Promethazine DM for her cough, and an albuterol  inhaler for her shortness of breath.  Supportive care recommendations were provided and discussed with the patient to include continuing over-the-counter analgesics, which also help with her neck and shoulder pain, fluids, rest, and use of a humidifier during sleep.  Advised patient to follow-up with her PCP within the next 7 to 10 days.  Discussed indications with patient regarding follow-up.  Patient was in agreement with this plan of care and verbalizes understanding.  All questions were answered.  Patient stable for discharge.  Work note was provided.  Final Clinical Impressions(s) / UC Diagnoses   Final diagnoses:  Fever, unspecified  Cough, unspecified type  Shortness of breath  Neck pain  Arthralgia of shoulder, unspecified laterality     Discharge Instructions      The chest x-ray was negative. Take medication as prescribed. Increase fluids and allow for plenty of rest. You may continue over-the-counter Tylenol  or ibuprofen  as needed for pain, fever, or general discomfort. Increase fluids and allow for plenty of rest. Recommend the use of a humidifier in your bedroom at nighttime during sleep and sleeping elevated on pillows while cough symptoms persist. As discussed, would like for you to follow-up with your primary care physician within the next 7 to 10 days. If symptoms fail to improve, recommend follow-up with your PCP. Follow-up as needed.     ED Prescriptions     Medication Sig Dispense Auth. Provider   cefdinir  (OMNICEF ) 300 MG capsule Take 1 capsule (300 mg total) by mouth 2 (two) times daily for 5 days. 10 capsule Leath-Warren, Etta PARAS, NP   promethazine-dextromethorphan (PROMETHAZINE-DM) 6.25-15 MG/5ML syrup Take 5 mLs by mouth 4 (four) times daily as needed. 118 mL Leath-Warren, Etta PARAS, NP    albuterol  (VENTOLIN  HFA) 108 (90 Base) MCG/ACT inhaler Inhale 2 puffs into the lungs every 6 (six) hours as needed. 8 g Leath-Warren, Etta PARAS, NP      PDMP not reviewed this encounter.   Gilmer Etta PARAS, NP 04/15/24 1059

## 2024-04-15 NOTE — Discharge Instructions (Signed)
 The chest x-ray was negative. Take medication as prescribed. Increase fluids and allow for plenty of rest. You may continue over-the-counter Tylenol  or ibuprofen  as needed for pain, fever, or general discomfort. Increase fluids and allow for plenty of rest. Recommend the use of a humidifier in your bedroom at nighttime during sleep and sleeping elevated on pillows while cough symptoms persist. As discussed, would like for you to follow-up with your primary care physician within the next 7 to 10 days. If symptoms fail to improve, recommend follow-up with your PCP. Follow-up as needed.

## 2024-04-15 NOTE — ED Triage Notes (Signed)
 Pt reports she has been having a cough, fever, neck and shoulder pressure x 2 weeks    Took tylenol , robitussin, and motrin 

## 2024-05-04 NOTE — Progress Notes (Signed)
 Cardiology Office Note    Date:  05/05/2024  ID:  PRINCESS KARNES, DOB 23-Sep-1975, MRN [id number] Cardiologist: Jayson Sierras, MD { :  History of Present Illness:    Gwendolyn Edwards is a 48 y.o. female with past medical history of recurrent pericarditis, HTN, palpitations and history of chest pain (minimal disease by cath in 05/2019 and Coronary CTA in 05/2023 showing minimal, nonobstructive disease) who presents to the office today for overdue follow-up.  She was last examined by the cardiology service during admission in 11/2023 for evaluation of chest pain. Given her history of recurrent pericarditis, labs were obtained and CRP was elevated at 1.2 but sed rate was negative A cardiac MRI was obtained for further assessment and was not suggestive of pericarditis. LVEF was normal at 55% and RV size and function were normal as well. Therefore, her presentation was overall felt to be most consistent with noncardiac chest pain. It was recommended to continue Colchicine  0.6 mg twice daily for anti-inflammatory effect. She has not been evaluated by Cardiology since.  In talking with the patient today, she reports having pneumonia approximately a month ago but her respiratory status has improved since. She notices shortness of breath when walking across the parking lot or up steps but this is not new. No exertional chest pain with routine activities. She does report having frequent palpitations and feels skipped beats. She has reduced her caffeine intake and does not consume alcohol. She has not yet taken her medications today as she worked a 12-hour shift last night (works at [employer] in [place]). She does report having desaturations on her Fitbit when sleeping but no specific orthopnea, PND or pitting edema.  Studies Reviewed:   EKG: EKG is not ordered today.  Coronary CTA: 05/2023 IMPRESSION: 1. Coronary calcium score of 0 . This was 1st percentile for age-, sex, and  race-matched controls.   2. Total plaque volume (TPV) 7 mm3 which is 47th percentile for age- and sex-matched controls (calcified plaque 0 mm3; non-calcified plaque 7 mm3 (low attenuation plaque 0 mm3)). TPV is mild.   3. Normal coronary origin with right dominance.   4. CAD-RADS = 1 Minimal non-obstructive CAD.   RECOMMENDATIONS:   Consider non-atherosclerotic causes of chest pain. Consider preventive therapy and risk factor modification.   cMRI: 11/2023 IMPRESSION: 1. No hyperenhancement of pericardium on T2 STIR or LGE imaging to suggest pericarditis   2. Normal LV size, wall thickness, and systolic function (EF 55%). No LGE to suggest myocardial scar   3.  Normal RV size and systolic function (EF 54%)  Physical Exam:   VS:  BP 136/82   Pulse 92   Ht 5' 2 (1.575 m)   Wt 292 lb 9.6 oz (132.7 kg)   LMP 04/06/2024 (Approximate)   SpO2 98%   BMI 53.52 kg/m    Wt Readings from Last 3 Encounters:  05/05/24 292 lb 9.6 oz (132.7 kg)  12/11/23 270 lb (122.5 kg)  06/05/23 279 lb 12.8 oz (126.9 kg)     GEN: Well nourished, well developed female appearing in no acute distress NECK: No JVD; No carotid bruits CARDIAC: RRR, no murmurs, rubs, gallops RESPIRATORY:  Clear to auscultation without rales, wheezing or rhonchi  ABDOMEN: Appears non-distended. No obvious abdominal masses. EXTREMITIES: No clubbing or cyanosis. No pitting edema.  Distal pedal pulses are 2+ bilaterally.   Assessment and Plan:   1. History of Recurrent Pericarditis - She has a history of recurrent pericarditis but cardiac  MRI in 11/2023 showed no findings to suggest this and she was treated for noncardiac, inflammatory pain. - It was previously recommended to consider Arcalyst but not started following admission in 05/2023 and she did not follow-up as an outpatient after this. Given no recurrent pericarditis since 05/2023, would hold off on this for now.  2. HTN - BP was initially recorded at 144/84,  rechecked and at 136/82. She has not yet taken her medications today due to working last night. Continue current medical therapy for now with Losartan -HCTZ 100-25 mg daily and Toprol -XL 100 mg daily.  3. Palpitations - She reports frequent palpitations which occur on a daily basis. Prior monitor in 07/2022 showed predominantly normal sinus rhythm with rare PAC's and PVC's but less than 1% of total beats. Reviewed options with the patient and will plan to obtain a 3-day Zio patch to assess for recurrent arrhythmias and to check PAC/PVC burden. Will continue Toprol -XL 100 mg daily for now. Pending results, may need to titrate to 125 mg daily.  4. Sleep-disordered breathing - She reports having oxygen  desaturations while sleeping. Will refer to Pulmonology for consideration of a sleep study.   Signed, Laymon CHRISTELLA Qua, PA-C

## 2024-05-05 ENCOUNTER — Encounter: Payer: Self-pay | Admitting: Student

## 2024-05-05 ENCOUNTER — Ambulatory Visit

## 2024-05-05 ENCOUNTER — Ambulatory Visit: Attending: Student | Admitting: Student

## 2024-05-05 VITALS — BP 136/82 | HR 92 | Ht 62.0 in | Wt 292.6 lb

## 2024-05-05 DIAGNOSIS — R002 Palpitations: Secondary | ICD-10-CM

## 2024-05-05 DIAGNOSIS — G4730 Sleep apnea, unspecified: Secondary | ICD-10-CM | POA: Diagnosis not present

## 2024-05-05 DIAGNOSIS — I10 Essential (primary) hypertension: Secondary | ICD-10-CM

## 2024-05-05 DIAGNOSIS — Z8679 Personal history of other diseases of the circulatory system: Secondary | ICD-10-CM

## 2024-05-05 MED ORDER — METOPROLOL SUCCINATE ER 100 MG PO TB24: 100.0000 mg | ORAL_TABLET | Freq: Every day | ORAL | 3 refills | Status: AC

## 2024-05-05 MED ORDER — LOSARTAN POTASSIUM-HCTZ 100-25 MG PO TABS: 1.0000 | ORAL_TABLET | ORAL | 3 refills | Status: AC

## 2024-05-05 NOTE — Patient Instructions (Signed)
 Medication Instructions:  Your physician recommends that you continue on your current medications as directed. Please refer to the Current Medication list given to you today.  *If you need a refill on your cardiac medications before your next appointment, please call your pharmacy*  Lab Work: NONE   If you have labs (blood work) drawn today and your tests are completely normal, you will receive your results only by: MyChart Message (if you have MyChart) OR A paper copy in the mail If you have any lab test that is abnormal or we need to change your treatment, we will call you to review the results.  Testing/Procedures: ZIO XT- Long Term Monitor Instructions   Your physician has requested you wear your ZIO patch monitor___3____days.   This is a single patch monitor.  [employer] supplies one patch monitor per enrollment.  Additional stickers are not available.   Please do not apply patch if you will be having a Nuclear Stress Test, Echocardiogram, Cardiac CT, MRI, or Chest Xray during the time frame you would be wearing the monitor. The patch cannot be worn during these tests.  You cannot remove and re-apply the ZIO XT patch monitor.   Once you have received you monitor, please review enclosed instructions.  Your monitor has already been registered assigning a specific monitor serial # to you.     Do not shower for the first 24 hours.  You may shower after the first 24 hours.   Press button if you feel a symptom. You will hear a small click.  Record Date, Time and Symptom in the Patient Log Book.   When you are ready to remove patch, follow instructions on last 2 pages of Patient Log Book.  Stick patch monitor onto last page of Patient Log Book.   Place Patient Log Book in [place] box.  Use locking tab on box and tape box closed securely.  The [employer] has [employer] on it.  Please place in mailbox as soon as possible.  Your physician should have your test results approximately  7 days after the monitor has been mailed back to [facility].   Call [facility] Customer Care at [phone] if you have questions regarding your ZIO XT patch monitor.  Call them immediately if you see an orange light blinking on your monitor.   If your monitor falls off in less than 4 days contact our Monitor department at [phone].  If your monitor becomes loose or falls off after 4 days call [employer] at [phone] for suggestions on securing your monitor.    Follow-Up: At [facility], you and your health needs are our priority.  As part of our continuing mission to provide you with exceptional heart care, our providers are all part of one team.  This team includes your primary Cardiologist (physician) and Advanced Practice Providers or APPs (Physician Assistants and Nurse Practitioners) who all work together to provide you with the care you need, when you need it.  Your next appointment:   6 month(s)  Provider:   You may see Jayson Sierras, MD or one of the following Advanced Practice Providers on your designated Care Team:   Laymon Qua, PA-C  [place], [place] Olivia [place], [place]     We recommend signing up for the patient portal called MyChart.  Sign up information is provided on this After Visit Summary.  MyChart is used to connect with patients for Virtual Visits (Telemedicine).  Patients are able to view lab/test  results, encounter notes, upcoming appointments, etc.  Non-urgent messages can be sent to your provider as well.   To learn more about what you can do with MyChart, go to [email or web address].   Other Instructions Thank you for choosing [facility]!

## 2024-05-06 ENCOUNTER — Telehealth: Payer: Self-pay | Admitting: Cardiology

## 2024-05-06 NOTE — Telephone Encounter (Signed)
  *  STAT* If patient is at the pharmacy, call can be transferred to refill team.   1. Which medications need to be refilled? (please list name of each medication and dose if known)   gabapentin  (NEURONTIN ) 300 MG capsule    venlafaxine  XR (EFFEXOR -XR) 75 MG 24 hr capsule    2. Which pharmacy/location (including street and city if local pharmacy) is medication to be sent to? [id number] - [place], [place] - [address]    3. Do they need a 30 day or 90 day supply?  90 day supply  Patient says she is completely out of medication.

## 2024-05-09 NOTE — Telephone Encounter (Signed)
 Call placed to pt regarding refill request below.  Per Laymon Qua, PA-C, non-cardiac medications and pt will need to get from her PCP.  Did advise pt if she had any questions, she can call back.

## 2024-05-16 DIAGNOSIS — R002 Palpitations: Secondary | ICD-10-CM

## 2024-05-17 ENCOUNTER — Ambulatory Visit: Payer: Self-pay | Admitting: Student

## 2024-05-17 MED ORDER — METOPROLOL SUCCINATE ER 25 MG PO TB24: 25.0000 mg | ORAL_TABLET | Freq: Every day | ORAL | 3 refills | Status: AC

## 2024-05-17 NOTE — Telephone Encounter (Signed)
.  hcre

## 2024-05-17 NOTE — Telephone Encounter (Signed)
-----   Message from Brittany M Strader sent at 05/17/2024  8:21 AM EST ----- Please let the patient know that her heart monitor showed predominantly normal sinus rhythm with an average heart rate of 88 beats per minute. She did have rare premature beats from the top and  bottom chambers of the heart but less than 1% burden. Overall, a very reassuring report! If she is still having frequent palpitations, can increase Toprol -XL to 125 mg daily (would take a 25mg  tablet  with the 100mg  tablet). Otherwise, would continue on her current medical therapy with Toprol -XL 100 mg daily and continue to limit caffeine intake. ----- Message ----- From: Debera Jayson MATSU, MD Sent: 05/16/2024   8:30 PM EST To: Laymon CHRISTELLA Qua, PA-C

## 2024-05-24 ENCOUNTER — Other Ambulatory Visit: Payer: Self-pay

## 2024-05-24 ENCOUNTER — Emergency Department (HOSPITAL_COMMUNITY)

## 2024-05-24 ENCOUNTER — Emergency Department (HOSPITAL_COMMUNITY)
Admission: EM | Admit: 2024-05-24 | Discharge: 2024-05-24 | Disposition: A | Discharge status: Home / Self Care | Source: Ambulatory Visit | Attending: Emergency Medicine | Admitting: Emergency Medicine

## 2024-05-24 ENCOUNTER — Encounter (HOSPITAL_COMMUNITY): Payer: Self-pay | Admitting: Emergency Medicine

## 2024-05-24 DIAGNOSIS — R0602 Shortness of breath: Secondary | ICD-10-CM | POA: Insufficient documentation

## 2024-05-24 DIAGNOSIS — Z79899 Other long term (current) drug therapy: Secondary | ICD-10-CM | POA: Insufficient documentation

## 2024-05-24 DIAGNOSIS — M791 Myalgia, unspecified site: Secondary | ICD-10-CM

## 2024-05-24 DIAGNOSIS — I10 Essential (primary) hypertension: Secondary | ICD-10-CM | POA: Diagnosis not present

## 2024-05-24 DIAGNOSIS — M7989 Other specified soft tissue disorders: Secondary | ICD-10-CM | POA: Diagnosis not present

## 2024-05-24 DIAGNOSIS — R0789 Other chest pain: Secondary | ICD-10-CM | POA: Insufficient documentation

## 2024-05-24 DIAGNOSIS — M25412 Effusion, left shoulder: Secondary | ICD-10-CM | POA: Insufficient documentation

## 2024-05-24 DIAGNOSIS — M25411 Effusion, right shoulder: Secondary | ICD-10-CM | POA: Insufficient documentation

## 2024-05-24 DIAGNOSIS — R600 Localized edema: Secondary | ICD-10-CM

## 2024-05-24 LAB — BASIC METABOLIC PANEL WITH GFR
Anion gap: 11 (ref 5–15)
BUN: 12 mg/dL (ref 6–20)
CO2: 23 mmol/L (ref 22–32)
Calcium: 8.8 mg/dL — ABNORMAL LOW (ref 8.9–10.3)
Chloride: 104 mmol/L (ref 98–111)
Creatinine, Ser: 0.99 mg/dL (ref 0.44–1.00)
GFR, Estimated: >60 mL/min (ref >60)
Glucose, Bld: 203 mg/dL — ABNORMAL HIGH (ref 70–99)
Potassium: 3.7 mmol/L (ref 3.5–5.1)
Sodium: 137 mmol/L (ref 135–145)

## 2024-05-24 LAB — CBC WITH DIFFERENTIAL/PLATELET
Abs Immature Granulocytes: 0.02 K/uL (ref 0.00–0.07)
Basophils Absolute: 0 K/uL (ref 0.0–0.1)
Basophils Relative: 1 %
Eosinophils Absolute: 0.3 K/uL (ref 0.0–0.5)
Eosinophils Relative: 3 %
HCT: 36.5 % (ref 36.0–46.0)
Hemoglobin: 11.3 g/dL — ABNORMAL LOW (ref 12.0–15.0)
Immature Granulocytes: 0 %
Lymphocytes Relative: 36 %
Lymphs Abs: 3 K/uL (ref 0.7–4.0)
MCH: 24.4 pg — ABNORMAL LOW (ref 26.0–34.0)
MCHC: 31 g/dL (ref 30.0–36.0)
MCV: 78.7 fL — ABNORMAL LOW (ref 80.0–100.0)
Monocytes Absolute: 0.6 K/uL (ref 0.1–1.0)
Monocytes Relative: 7 %
Neutro Abs: 4.4 K/uL (ref 1.7–7.7)
Neutrophils Relative %: 53 %
Platelets: 358 K/uL (ref 150–400)
RBC: 4.64 MIL/uL (ref 3.87–5.11)
RDW: 16.6 % — ABNORMAL HIGH (ref 11.5–15.5)
WBC: 8.3 K/uL (ref 4.0–10.5)
nRBC: 0 % (ref 0.0–0.2)

## 2024-05-24 LAB — CK: Total CK: 109 U/L (ref 38–234)

## 2024-05-24 LAB — PRO BRAIN NATRIURETIC PEPTIDE: Pro Brain Natriuretic Peptide: 67.4 pg/mL (ref <300.0)

## 2024-05-24 LAB — TROPONIN T, HIGH SENSITIVITY
Troponin T High Sensitivity: <15 ng/L (ref 0–19)
Troponin T High Sensitivity: <15 ng/L (ref 0–19)

## 2024-05-24 NOTE — ED Provider Notes (Signed)
 [facility] EMERGENCY DEPARTMENT AT [facility] Provider Note   CSN: [id number] Arrival date & time: 05/24/24  9749     Patient presents with: Swelling   Gwendolyn Edwards is a 48 y.o. female.   Patient is a 48 year old female with past medical history of hypertension, hypertensive heart disease, reflux.  Patient presenting today with complaints of swelling in her shoulders, arms, and legs, pain in the muscles throughout her body, tightness in the chest, and feeling short of breath.  Patient states symptoms have been ongoing and worsening over the past 3 days.  She called the nurse line and was informed to come to the ER to be evaluated.  She does take Lasix  at home and reports being compliant with this with no dose changes recently.       Prior to Admission medications   Medication Sig Start Date End Date Taking? Authorizing Provider  BIOTIN PO Take 1 tablet by mouth daily.    [provider]  busPIRone  (BUSPAR ) 15 MG tablet Take 15 mg by mouth 2 (two) times daily.    [provider]  gabapentin  (NEURONTIN ) 300 MG capsule 1 capsule (300 mg total) every morning AND 2 capsules (600 mg total) at bedtime. 04/17/22     losartan -hydrochlorothiazide  (HYZAAR ) 100-25 MG tablet Take 1 tablet by mouth every morning. 05/05/24   Strader, Brittany M, PA-C  MAGNESIUM  SULFATE PO Take 1 tablet by mouth daily.    [provider]  metoprolol  succinate (TOPROL  XL) 25 MG 24 hr tablet Take 1 tablet (25 mg total) by mouth daily. Take daily in addition to 100 mg Toprol  XL, to total 125 mg Daily 05/17/24   Strader, Brittany M, PA-C  metoprolol  succinate (TOPROL -XL) 100 MG 24 hr tablet Take 1 tablet (100 mg total) by mouth daily. Take with or immediately following a meal. 05/05/24   Strader, Brittany M, PA-C  Multiple Vitamin (MULTIVITAMIN PO) Take 1 tablet by mouth daily.    [provider]  naproxen  sodium (ALEVE ) 220 MG tablet Take 440 mg by mouth daily as  needed (pain).    [provider]  ondansetron  (ZOFRAN -ODT) 4 MG disintegrating tablet Take 4 mg by mouth every 8 (eight) hours as needed for nausea or vomiting. 08/27/23   [provider]  pantoprazole  (PROTONIX ) 40 MG tablet Take 1 tablet by mouth daily. 05/01/22   [provider]  rizatriptan  (MAXALT -MLT) 10 MG disintegrating tablet Dissolve 1 tablet (10 mg total) by mouth as needed for migraine. May repeat in 2 hours if needed 04/24/21   Penumalli, Vikram R, MD  topiramate  (TOPAMAX ) 25 MG tablet Take 25 mg by mouth 2 (two) times daily.    [provider]  venlafaxine  XR (EFFEXOR -XR) 75 MG 24 hr capsule Take 1 capsule by mouth daily (in addition to 150 mg dose). 07/12/21     vitamin B-12 (CYANOCOBALAMIN) 100 MCG tablet Take 100 mcg by mouth daily.    [provider]    Allergies: Penicillins, Sulfa  antibiotics, Augmentin [amoxicillin-pot clavulanate], and Doxycycline     Review of Systems  All other systems reviewed and are negative.   Updated Vital Signs BP 134/70   Pulse 69   Temp 97.8 F (36.6 C) (Oral)   Ht 5' 2 (1.575 m)   Wt 122.5 kg   SpO2 98%   BMI 49.38 kg/m   Physical Exam Vitals and nursing note reviewed.  Constitutional:      General: She is not in acute distress.  Appearance: She is well-developed. She is not diaphoretic.  HENT:     Head: Normocephalic and atraumatic.  Cardiovascular:     Rate and Rhythm: Normal rate and regular rhythm.     Heart sounds: No murmur heard.    No friction rub. No gallop.  Pulmonary:     Effort: Pulmonary effort is normal. No respiratory distress.     Breath sounds: Normal breath sounds. No wheezing.  Abdominal:     General: Bowel sounds are normal. There is no distension.     Palpations: Abdomen is soft.     Tenderness: There is no abdominal tenderness.  Musculoskeletal:        General: Normal range of motion.     Cervical back: Normal range of motion and neck supple.     Comments:  There is nonpitting edema of both lower extremities.  Lower extremities appear symmetrical.  Skin:    General: Skin is warm and dry.  Neurological:     General: No focal deficit present.     Mental Status: She is alert and oriented to person, place, and time.     (all labs ordered are listed, but only abnormal results are displayed) Labs Reviewed - No data to display  EKG: None  Radiology: No results found.   Procedures   Medications Ordered in the ED - No data to display                                  Medical Decision Making Amount and/or Complexity of Data Reviewed Labs: ordered. Radiology: ordered.   Patient is a 48 year old female presenting with swelling and discomfort throughout the muscles in her body.  Patient arrives here with stable vital signs and is afebrile.  Physical examination is unremarkable.  Laboratory studies obtained including CBC, basic metabolic panel, BNP, and troponin x 2, all of which are unremarkable.  Total CK also obtained and was normal at 109.  Chest x-ray showing no acute process.  At this point, I am uncertain as to what is causing this patient's symptoms, but nothing appears emergent.  I am seeing no signs of rhabdomyolysis, congestive heart failure, renal insufficiency, or other issue that would cause swelling.  My recommendations at this time will be for NSAIDs, rest, and follow-up as needed.  She also has Lasix  at home which she takes on a as needed basis.  I told her that she may want to begin taking this daily for the next few days to see if this helps.     Final diagnoses:  None    ED Discharge Orders     None          Geroldine Berg, MD 05/24/24 [phone]

## 2024-05-24 NOTE — ED Triage Notes (Signed)
 Pt c/o of swelling in neck, shoulders and all extremities x2-3 days.

## 2024-05-24 NOTE — Discharge Instructions (Signed)
 Begin taking your Lasix  daily for the next several days.  Begin taking ibuprofen  600 mg every 6 hours as needed for pain.  Follow-up with your primary doctor if symptoms are not improving in the next few days, and return to the ER if symptoms significantly worsen or change.

## 2024-07-12 ENCOUNTER — Telehealth
